# Patient Record
Sex: Female | Born: 1940 | Race: White | Hispanic: No | Smoking: Never smoker
Health system: Southern US, Community
[De-identification: ages and names within clinical notes are randomized; demographics above are authoritative.]

## PROBLEM LIST (undated history)

## (undated) DIAGNOSIS — R053 Chronic cough: Secondary | ICD-10-CM

## (undated) DIAGNOSIS — J45909 Unspecified asthma, uncomplicated: Secondary | ICD-10-CM

## (undated) DIAGNOSIS — R519 Headache, unspecified: Secondary | ICD-10-CM

## (undated) DIAGNOSIS — N189 Chronic kidney disease, unspecified: Secondary | ICD-10-CM

## (undated) DIAGNOSIS — I471 Supraventricular tachycardia, unspecified: Secondary | ICD-10-CM

## (undated) DIAGNOSIS — Z9289 Personal history of other medical treatment: Secondary | ICD-10-CM

## (undated) DIAGNOSIS — I959 Hypotension, unspecified: Secondary | ICD-10-CM

## (undated) DIAGNOSIS — M1990 Unspecified osteoarthritis, unspecified site: Secondary | ICD-10-CM

## (undated) DIAGNOSIS — T4145XA Adverse effect of unspecified anesthetic, initial encounter: Secondary | ICD-10-CM

## (undated) DIAGNOSIS — M109 Gout, unspecified: Secondary | ICD-10-CM

## (undated) DIAGNOSIS — E669 Obesity, unspecified: Secondary | ICD-10-CM

## (undated) DIAGNOSIS — D649 Anemia, unspecified: Secondary | ICD-10-CM

## (undated) DIAGNOSIS — A419 Sepsis, unspecified organism: Secondary | ICD-10-CM

## (undated) DIAGNOSIS — K5792 Diverticulitis of intestine, part unspecified, without perforation or abscess without bleeding: Secondary | ICD-10-CM

## (undated) DIAGNOSIS — I517 Cardiomegaly: Secondary | ICD-10-CM

## (undated) DIAGNOSIS — Y95 Nosocomial condition: Secondary | ICD-10-CM

## (undated) DIAGNOSIS — K5903 Drug induced constipation: Secondary | ICD-10-CM

## (undated) DIAGNOSIS — I509 Heart failure, unspecified: Secondary | ICD-10-CM

## (undated) DIAGNOSIS — IMO0001 Reserved for inherently not codable concepts without codable children: Secondary | ICD-10-CM

## (undated) DIAGNOSIS — J189 Pneumonia, unspecified organism: Secondary | ICD-10-CM

## (undated) DIAGNOSIS — T8859XA Other complications of anesthesia, initial encounter: Secondary | ICD-10-CM

## (undated) DIAGNOSIS — I214 Non-ST elevation (NSTEMI) myocardial infarction: Secondary | ICD-10-CM

## (undated) DIAGNOSIS — R51 Headache: Secondary | ICD-10-CM

## (undated) DIAGNOSIS — R05 Cough: Secondary | ICD-10-CM

## (undated) DIAGNOSIS — I10 Essential (primary) hypertension: Secondary | ICD-10-CM

## (undated) DIAGNOSIS — S143XXA Injury of brachial plexus, initial encounter: Secondary | ICD-10-CM

## (undated) DIAGNOSIS — R002 Palpitations: Secondary | ICD-10-CM

## (undated) DIAGNOSIS — I499 Cardiac arrhythmia, unspecified: Secondary | ICD-10-CM

## (undated) DIAGNOSIS — G4730 Sleep apnea, unspecified: Secondary | ICD-10-CM

## (undated) DIAGNOSIS — E1340 Other specified diabetes mellitus with diabetic neuropathy, unspecified: Secondary | ICD-10-CM

## (undated) DIAGNOSIS — R062 Wheezing: Secondary | ICD-10-CM

## (undated) DIAGNOSIS — J40 Bronchitis, not specified as acute or chronic: Secondary | ICD-10-CM

## (undated) DIAGNOSIS — E039 Hypothyroidism, unspecified: Secondary | ICD-10-CM

## (undated) DIAGNOSIS — E119 Type 2 diabetes mellitus without complications: Secondary | ICD-10-CM

## (undated) DIAGNOSIS — K759 Inflammatory liver disease, unspecified: Secondary | ICD-10-CM

## (undated) HISTORY — PX: EYE SURGERY: SHX253

## (undated) HISTORY — DX: Obesity, unspecified: E66.9

## (undated) HISTORY — DX: Cardiomegaly: I51.7

## (undated) HISTORY — PX: CARDIAC CATHETERIZATION: SHX172

## (undated) HISTORY — PX: ACHILLES TENDON REPAIR: SUR1153

## (undated) HISTORY — PX: ABDOMINAL HYSTERECTOMY: SHX81

---

## 1982-08-28 HISTORY — PX: ABDOMINAL HYSTERECTOMY: SHX81

## 1984-08-28 HISTORY — PX: CHOLECYSTECTOMY: SHX55

## 2003-06-25 ENCOUNTER — Ambulatory Visit (HOSPITAL_COMMUNITY): Admission: RE | Admit: 2003-06-25 | Discharge: 2003-06-25 | Payer: Self-pay | Admitting: Cardiovascular Disease

## 2008-11-29 ENCOUNTER — Emergency Department: Payer: Self-pay | Admitting: Emergency Medicine

## 2009-02-03 ENCOUNTER — Ambulatory Visit: Payer: Self-pay | Admitting: Orthopedic Surgery

## 2009-03-20 ENCOUNTER — Ambulatory Visit: Payer: Self-pay | Admitting: Internal Medicine

## 2009-03-21 ENCOUNTER — Inpatient Hospital Stay: Payer: Self-pay | Admitting: Endocrinology

## 2010-11-15 ENCOUNTER — Ambulatory Visit: Payer: Self-pay | Admitting: Endocrinology

## 2011-11-13 ENCOUNTER — Ambulatory Visit: Payer: Self-pay | Admitting: Endocrinology

## 2013-08-01 ENCOUNTER — Emergency Department: Payer: Self-pay | Admitting: Emergency Medicine

## 2013-08-01 LAB — URINALYSIS, COMPLETE
Bacteria: NONE SEEN
Bilirubin,UR: NEGATIVE
Blood: NEGATIVE
Glucose,UR: NEGATIVE mg/dL (ref 0–75)
Ketone: NEGATIVE
Leukocyte Esterase: NEGATIVE
Nitrite: NEGATIVE
Ph: 5 (ref 4.5–8.0)
Protein: NEGATIVE
RBC,UR: <1 /HPF (ref 0–5)
Specific Gravity: 1.015 (ref 1.003–1.030)
Squamous Epithelial: 1
WBC UR: <1 /HPF (ref 0–5)

## 2013-08-01 LAB — TROPONIN I: Troponin-I: <0.02 ng/mL

## 2013-08-01 LAB — CBC
HCT: 36.8 % (ref 35.0–47.0)
HGB: 12.1 g/dL (ref 12.0–16.0)
MCH: 29.8 pg (ref 26.0–34.0)
MCHC: 32.9 g/dL (ref 32.0–36.0)
MCV: 91 fL (ref 80–100)
Platelet: 321 10*3/uL (ref 150–440)
RBC: 4.06 10*6/uL (ref 3.80–5.20)
RDW: 15.3 % — ABNORMAL HIGH (ref 11.5–14.5)
WBC: 11 10*3/uL (ref 3.6–11.0)

## 2013-08-01 LAB — COMPREHENSIVE METABOLIC PANEL
Albumin: 3.3 g/dL — ABNORMAL LOW (ref 3.4–5.0)
Alkaline Phosphatase: 68 U/L
Anion Gap: 11 (ref 7–16)
BUN: 25 mg/dL — ABNORMAL HIGH (ref 7–18)
Bilirubin,Total: 0.3 mg/dL (ref 0.2–1.0)
Calcium, Total: 9.7 mg/dL (ref 8.5–10.1)
Chloride: 99 mmol/L (ref 98–107)
Co2: 26 mmol/L (ref 21–32)
Creatinine: 1.43 mg/dL — ABNORMAL HIGH (ref 0.60–1.30)
EGFR (African American): 42 — ABNORMAL LOW
EGFR (Non-African Amer.): 36 — ABNORMAL LOW
Glucose: 136 mg/dL — ABNORMAL HIGH (ref 65–99)
Osmolality: 278 (ref 275–301)
Potassium: 3.7 mmol/L (ref 3.5–5.1)
SGOT(AST): 17 U/L (ref 15–37)
SGPT (ALT): 22 U/L (ref 12–78)
Sodium: 136 mmol/L (ref 136–145)
Total Protein: 8.2 g/dL (ref 6.4–8.2)

## 2013-08-01 LAB — CK TOTAL AND CKMB (NOT AT ARMC)
CK, Total: 50 U/L (ref 21–215)
CK-MB: 0.7 ng/mL (ref 0.5–3.6)

## 2013-08-01 LAB — SEDIMENTATION RATE: Erythrocyte Sed Rate: 63 mm/hr — ABNORMAL HIGH (ref 0–30)

## 2014-05-09 ENCOUNTER — Emergency Department: Payer: Self-pay | Admitting: Emergency Medicine

## 2014-05-09 LAB — CBC
HCT: 35.5 % (ref 35.0–47.0)
HGB: 11.7 g/dL — ABNORMAL LOW (ref 12.0–16.0)
MCH: 30.8 pg (ref 26.0–34.0)
MCHC: 33 g/dL (ref 32.0–36.0)
MCV: 93 fL (ref 80–100)
Platelet: 335 10*3/uL (ref 150–440)
RBC: 3.81 10*6/uL (ref 3.80–5.20)
RDW: 15.7 % — ABNORMAL HIGH (ref 11.5–14.5)
WBC: 9.1 10*3/uL (ref 3.6–11.0)

## 2014-05-09 LAB — TROPONIN I: Troponin-I: <0.02 ng/mL

## 2014-05-09 LAB — BASIC METABOLIC PANEL
Anion Gap: 9 (ref 7–16)
BUN: 19 mg/dL — ABNORMAL HIGH (ref 7–18)
Calcium, Total: 9.1 mg/dL (ref 8.5–10.1)
Chloride: 108 mmol/L — ABNORMAL HIGH (ref 98–107)
Co2: 24 mmol/L (ref 21–32)
Creatinine: 1.22 mg/dL (ref 0.60–1.30)
EGFR (African American): 51 — ABNORMAL LOW
EGFR (Non-African Amer.): 44 — ABNORMAL LOW
Glucose: 105 mg/dL — ABNORMAL HIGH (ref 65–99)
Osmolality: 284 (ref 275–301)
Potassium: 3.4 mmol/L — ABNORMAL LOW (ref 3.5–5.1)
Sodium: 141 mmol/L (ref 136–145)

## 2014-05-09 LAB — MAGNESIUM: Magnesium: 1.7 mg/dL — ABNORMAL LOW

## 2014-05-20 ENCOUNTER — Ambulatory Visit: Payer: Self-pay | Admitting: Endocrinology

## 2015-02-09 NOTE — Discharge Instructions (Signed)

## 2015-02-10 ENCOUNTER — Ambulatory Visit
Admission: RE | Admit: 2015-02-10 | Discharge: 2015-02-10 | Disposition: A | Discharge status: Home / Self Care | Payer: Medicare Other | Source: Ambulatory Visit | Attending: Ophthalmology | Admitting: Ophthalmology

## 2015-02-10 ENCOUNTER — Ambulatory Visit: Admission: RE | Admit: 2015-02-10 | Payer: Self-pay | Source: Ambulatory Visit | Admitting: Ophthalmology

## 2015-02-10 ENCOUNTER — Encounter
Admission: RE | Disposition: A | Discharge status: Home / Self Care | Payer: Self-pay | Source: Ambulatory Visit | Attending: Ophthalmology

## 2015-02-10 ENCOUNTER — Ambulatory Visit: Payer: Medicare Other | Admitting: Anesthesiology

## 2015-02-10 DIAGNOSIS — E119 Type 2 diabetes mellitus without complications: Secondary | ICD-10-CM | POA: Insufficient documentation

## 2015-02-10 DIAGNOSIS — R062 Wheezing: Secondary | ICD-10-CM | POA: Insufficient documentation

## 2015-02-10 DIAGNOSIS — Z87898 Personal history of other specified conditions: Secondary | ICD-10-CM | POA: Diagnosis not present

## 2015-02-10 DIAGNOSIS — I509 Heart failure, unspecified: Secondary | ICD-10-CM | POA: Insufficient documentation

## 2015-02-10 DIAGNOSIS — Z90710 Acquired absence of both cervix and uterus: Secondary | ICD-10-CM | POA: Diagnosis not present

## 2015-02-10 DIAGNOSIS — Z9049 Acquired absence of other specified parts of digestive tract: Secondary | ICD-10-CM | POA: Insufficient documentation

## 2015-02-10 DIAGNOSIS — J449 Chronic obstructive pulmonary disease, unspecified: Secondary | ICD-10-CM | POA: Diagnosis not present

## 2015-02-10 DIAGNOSIS — M109 Gout, unspecified: Secondary | ICD-10-CM | POA: Insufficient documentation

## 2015-02-10 DIAGNOSIS — H2512 Age-related nuclear cataract, left eye: Secondary | ICD-10-CM | POA: Diagnosis not present

## 2015-02-10 DIAGNOSIS — R002 Palpitations: Secondary | ICD-10-CM | POA: Insufficient documentation

## 2015-02-10 DIAGNOSIS — I471 Supraventricular tachycardia: Secondary | ICD-10-CM | POA: Diagnosis not present

## 2015-02-10 DIAGNOSIS — Z885 Allergy status to narcotic agent status: Secondary | ICD-10-CM | POA: Insufficient documentation

## 2015-02-10 DIAGNOSIS — I10 Essential (primary) hypertension: Secondary | ICD-10-CM | POA: Insufficient documentation

## 2015-02-10 DIAGNOSIS — K5790 Diverticulosis of intestine, part unspecified, without perforation or abscess without bleeding: Secondary | ICD-10-CM | POA: Insufficient documentation

## 2015-02-10 DIAGNOSIS — Z79899 Other long term (current) drug therapy: Secondary | ICD-10-CM | POA: Insufficient documentation

## 2015-02-10 DIAGNOSIS — M7989 Other specified soft tissue disorders: Secondary | ICD-10-CM | POA: Insufficient documentation

## 2015-02-10 HISTORY — DX: Heart failure, unspecified: I50.9

## 2015-02-10 HISTORY — DX: Palpitations: R00.2

## 2015-02-10 HISTORY — DX: Type 2 diabetes mellitus without complications: E11.9

## 2015-02-10 HISTORY — DX: Reserved for inherently not codable concepts without codable children: IMO0001

## 2015-02-10 HISTORY — DX: Other specified diabetes mellitus with diabetic neuropathy, unspecified: E13.40

## 2015-02-10 HISTORY — DX: Other complications of anesthesia, initial encounter: T88.59XA

## 2015-02-10 HISTORY — DX: Supraventricular tachycardia: I47.1

## 2015-02-10 HISTORY — DX: Pneumonia, unspecified organism: J18.9

## 2015-02-10 HISTORY — DX: Anemia, unspecified: D64.9

## 2015-02-10 HISTORY — DX: Wheezing: R06.2

## 2015-02-10 HISTORY — DX: Supraventricular tachycardia, unspecified: I47.10

## 2015-02-10 HISTORY — DX: Unspecified asthma, uncomplicated: J45.909

## 2015-02-10 HISTORY — DX: Gout, unspecified: M10.9

## 2015-02-10 HISTORY — DX: Adverse effect of unspecified anesthetic, initial encounter: T41.45XA

## 2015-02-10 HISTORY — DX: Essential (primary) hypertension: I10

## 2015-02-10 HISTORY — DX: Inflammatory liver disease, unspecified: K75.9

## 2015-02-10 HISTORY — DX: Chronic cough: R05.3

## 2015-02-10 HISTORY — DX: Unspecified osteoarthritis, unspecified site: M19.90

## 2015-02-10 HISTORY — DX: Diverticulitis of intestine, part unspecified, without perforation or abscess without bleeding: K57.92

## 2015-02-10 HISTORY — DX: Hypothyroidism, unspecified: E03.9

## 2015-02-10 HISTORY — PX: CATARACT EXTRACTION W/PHACO: SHX586

## 2015-02-10 HISTORY — DX: Cough: R05

## 2015-02-10 HISTORY — DX: Bronchitis, not specified as acute or chronic: J40

## 2015-02-10 HISTORY — DX: Injury of brachial plexus, initial encounter: S14.3XXA

## 2015-02-10 LAB — GLUCOSE, CAPILLARY
Glucose-Capillary: 110 mg/dL — ABNORMAL HIGH (ref 65–99)
Glucose-Capillary: 119 mg/dL — ABNORMAL HIGH (ref 65–99)

## 2015-02-10 SURGERY — PHACOEMULSIFICATION, CATARACT, WITH IOL INSERTION
Anesthesia: General | Laterality: Left

## 2015-02-10 MED ORDER — BRIMONIDINE TARTRATE 0.2 % OP SOLN
OPHTHALMIC | Status: DC | PRN
  Administered 2015-02-10: 1 [drp] via OPHTHALMIC

## 2015-02-10 MED ORDER — POVIDONE-IODINE 5 % OP SOLN
1.0000 "application " | OPHTHALMIC | Status: DC | PRN
  Administered 2015-02-10: 1 via OPHTHALMIC

## 2015-02-10 MED ORDER — MIDAZOLAM HCL 2 MG/2ML IJ SOLN
INTRAMUSCULAR | Status: DC | PRN
  Administered 2015-02-10: 2 mg via INTRAVENOUS

## 2015-02-10 MED ORDER — EPINEPHRINE HCL 1 MG/ML IJ SOLN
INTRAOCULAR | Status: DC | PRN
  Administered 2015-02-10: 75 mL via OPHTHALMIC

## 2015-02-10 MED ORDER — LIDOCAINE HCL (PF) 4 % IJ SOLN
INTRAMUSCULAR | Status: DC | PRN
  Administered 2015-02-10: .5 mL via OPHTHALMIC

## 2015-02-10 MED ORDER — CEFUROXIME OPHTHALMIC INJECTION 1 MG/0.1 ML
INJECTION | OPHTHALMIC | Status: DC | PRN
  Administered 2015-02-10: 1 mg via INTRACAMERAL

## 2015-02-10 MED ORDER — TIMOLOL MALEATE 0.5 % OP SOLN
OPHTHALMIC | Status: DC | PRN
  Administered 2015-02-10: 1 [drp] via OPHTHALMIC

## 2015-02-10 MED ORDER — TETRACAINE HCL 0.5 % OP SOLN
1.0000 [drp] | OPHTHALMIC | Status: DC | PRN
  Administered 2015-02-10: 1 [drp] via OPHTHALMIC

## 2015-02-10 MED ORDER — ARMC OPHTHALMIC DILATING GEL
1.0000 "application " | OPHTHALMIC | Status: DC | PRN
  Administered 2015-02-10 (×2): 1 via OPHTHALMIC

## 2015-02-10 MED ORDER — NA HYALUR & NA CHOND-NA HYALUR 0.4-0.35 ML IO KIT
PACK | INTRAOCULAR | Status: DC | PRN
  Administered 2015-02-10: 1 mL via INTRAOCULAR

## 2015-02-10 MED ORDER — FENTANYL CITRATE (PF) 100 MCG/2ML IJ SOLN
INTRAMUSCULAR | Status: DC | PRN
Start: 2015-02-10 — End: 2015-02-10
  Administered 2015-02-10: 50 ug via INTRAVENOUS

## 2015-02-10 SURGICAL SUPPLY — 28 items
CANNULA ANT/CHMB 27G (MISCELLANEOUS) ×1 IMPLANT
CANNULA ANT/CHMB 27GA (MISCELLANEOUS) ×3 IMPLANT
GLOVE SURG LX 7.5 STRW (GLOVE) ×2
GLOVE SURG LX STRL 7.5 STRW (GLOVE) ×1 IMPLANT
GLOVE SURG TRIUMPH 8.0 PF LTX (GLOVE) ×3 IMPLANT
GOWN STRL REUS W/ TWL LRG LVL3 (GOWN DISPOSABLE) ×2 IMPLANT
GOWN STRL REUS W/TWL LRG LVL3 (GOWN DISPOSABLE) ×6
LENS IOL ACRSF IQ PC 19.0 (Intraocular Lens) IMPLANT
LENS IOL ACRYSOF IQ POST 19.0 (Intraocular Lens) ×3 IMPLANT
MARKER SKIN SURG W/RULER VIO (MISCELLANEOUS) ×3 IMPLANT
NDL FILTER BLUNT 18X1 1/2 (NEEDLE) ×1 IMPLANT
NDL RETROBULBAR .5 NSTRL (NEEDLE) IMPLANT
NEEDLE FILTER BLUNT 18X 1/2SAF (NEEDLE) ×2
NEEDLE FILTER BLUNT 18X1 1/2 (NEEDLE) ×1 IMPLANT
PACK CATARACT BRASINGTON (MISCELLANEOUS) ×3 IMPLANT
PACK EYE AFTER SURG (MISCELLANEOUS) ×3 IMPLANT
PACK OPTHALMIC (MISCELLANEOUS) ×3 IMPLANT
RING MALYGIN 7.0 (MISCELLANEOUS) IMPLANT
SUT ETHILON 10-0 CS-B-6CS-B-6 (SUTURE)
SUT VICRYL  9 0 (SUTURE)
SUT VICRYL 9 0 (SUTURE) IMPLANT
SUTURE EHLN 10-0 CS-B-6CS-B-6 (SUTURE) IMPLANT
SYR 3ML LL SCALE MARK (SYRINGE) ×3 IMPLANT
SYR 5ML LL (SYRINGE) IMPLANT
SYR TB 1ML LUER SLIP (SYRINGE) ×3 IMPLANT
WATER STERILE IRR 250ML POUR (IV SOLUTION) ×3 IMPLANT
WATER STERILE IRR 500ML POUR (IV SOLUTION) IMPLANT
WIPE NON LINTING 3.25X3.25 (MISCELLANEOUS) ×3 IMPLANT

## 2015-02-10 NOTE — Anesthesia Postprocedure Evaluation (Signed)
  Anesthesia Post-op Note  Patient: Tamara [name]  Procedure(s) Performed: Procedure(s) with comments: CATARACT EXTRACTION PHACO AND INTRAOCULAR LENS PLACEMENT (IOC) (Left) - DIABETIC  Anesthesia type:General  Patient location: PACU  Post pain: Pain level controlled  Post assessment: Post-op Vital signs reviewed, Patient's Cardiovascular Status Stable, Respiratory Function Stable, Patent Airway and No signs of Nausea or vomiting  Post vital signs: Reviewed and stable  Last Vitals:  Filed Vitals:   02/10/15 0757  BP:   Pulse: 60  Temp: 35.2 C  Resp: 8    Level of consciousness: awake, alert  and patient cooperative  Complications: No apparent anesthesia complications

## 2015-02-10 NOTE — Anesthesia Preprocedure Evaluation (Signed)
Anesthesia Evaluation  Patient identified by MRN, date of birth, ID band Patient awake    Reviewed: Allergy & Precautions, H&P , NPO status , Patient's Chart, lab work & pertinent test results, reviewed documented beta blocker date and time   Airway Mallampati: II  TM Distance: >3 FB Neck ROM: full    Dental no notable dental hx.    Pulmonary neg pulmonary ROS, shortness of breath and with exertion, asthma ,  breath sounds clear to auscultation  Pulmonary exam normal       Cardiovascular Exercise Tolerance: Good hypertension, +CHF Supra Ventricular Tachycardia Rhythm:regular Rate:Normal  Pt can lie flat without chest pain or shortness of breath.   Neuro/Psych negative neurological ROS  negative psych ROS   GI/Hepatic negative GI ROS, Neg liver ROS, (+) Hepatitis -  Endo/Other  negative endocrine ROSdiabetesHypothyroidism   Renal/GU negative Renal ROS  negative genitourinary   Musculoskeletal   Abdominal   Peds  Hematology negative hematology ROS (+)   Anesthesia Other Findings   Reproductive/Obstetrics negative OB ROS                             Anesthesia Physical Anesthesia Plan  ASA: II  Anesthesia Plan: General   Post-op Pain Management:    Induction:   Airway Management Planned:   Additional Equipment:   Intra-op Plan:   Post-operative Plan:   Informed Consent: I have reviewed the patients History and Physical, chart, labs and discussed the procedure including the risks, benefits and alternatives for the proposed anesthesia with the patient or authorized representative who has indicated his/her understanding and acceptance.   Dental Advisory Given  Plan Discussed with: CRNA  Anesthesia Plan Comments:         Anesthesia Quick Evaluation

## 2015-02-10 NOTE — Anesthesia Procedure Notes (Signed)
Procedure Name: MAC Performed by: Xavius Spadafore Pre-anesthesia Checklist: Patient identified, Emergency Drugs available, Suction available, Timeout performed and Patient being monitored Patient Re-evaluated:Patient Re-evaluated prior to inductionOxygen Delivery Method: Nasal cannula Placement Confirmation: positive ETCO2     

## 2015-02-10 NOTE — H&P (Signed)
  The History and Physical notes were scanned in.  The patient remains stable and unchanged from the H&P.   Previous H&P reviewed, patient examined, and there are no changes.  Tamara [name] 02/10/2015 7:33 AM

## 2015-02-10 NOTE — Transfer of Care (Signed)
Immediate Anesthesia Transfer of Care Note  Patient: Tamara [name]  Procedure(s) Performed: Procedure(s) with comments: CATARACT EXTRACTION PHACO AND INTRAOCULAR LENS PLACEMENT (IOC) (Left) - DIABETIC  Patient Location: PACU  Anesthesia Type: General  Level of Consciousness: awake, alert  and patient cooperative  Airway and Oxygen Therapy: Patient Spontanous Breathing and Patient connected to supplemental oxygen  Post-op Assessment: Post-op Vital signs reviewed, Patient's Cardiovascular Status Stable, Respiratory Function Stable, Patent Airway and No signs of Nausea or vomiting  Post-op Vital Signs: Reviewed and stable  Complications: No apparent anesthesia complications

## 2015-02-10 NOTE — Op Note (Signed)
OPERATIVE NOTE  Tamara [name] [id number] 02/10/2015   PREOPERATIVE DIAGNOSIS:  Nuclear sclerotic cataract left eye. H25.12   POSTOPERATIVE DIAGNOSIS:    Nuclear sclerotic cataract left eye.     PROCEDURE:  Phacoemusification with posterior chamber intraocular lens placement of the left eye   LENS:   Implant Name Type Inv. Item Serial No. Manufacturer Lot No. LRB No. Used  IMPLANT LENS - [id number] Intraocular Lens IMPLANT LENS [id number] ALCON   Left 1     [id number] 11.0   ULTRASOUND TIME: 12%  of 1 minutes 9 seconds, CDE 8.7   SURGEON:  Deirdre Evener, MD   ANESTHESIA:  Topical with tetracaine drops and 2% Xylocaine jelly.   COMPLICATIONS:  None.   DESCRIPTION OF PROCEDURE:  The patient was identified in the holding room and transported to the operating room and placed in the supine position under the operating microscope.  The left eye was identified as the operative eye and it was prepped and draped in the usual sterile ophthalmic fashion.   A 1 millimeter clear-corneal paracentesis was made at the 1:30 position.  The anterior chamber was filled with Viscoat viscoelastic.  A 2.4 millimeter keratome was used to make a near-clear corneal incision at the 10:30 position.  .  A curvilinear capsulorrhexis was made with a cystotome and capsulorrhexis forceps.  Balanced salt solution was used to hydrodissect and hydrodelineate the nucleus.   Phacoemulsification was then used in stop and chop fashion to remove the lens nucleus and epinucleus.  The remaining cortex was then removed using the irrigation and aspiration handpiece. Provisc was then placed into the capsular bag to distend it for lens placement.  A lens was then injected into the capsular bag.  The remaining viscoelastic was aspirated.   Wounds were hydrated with balanced salt solution.  The anterior chamber was inflated to a physiologic pressure with balanced salt solution.  No wound leaks were noted. Cefuroxime 0.1 ml of a  10mg /ml solution was injected into the anterior chamber for a dose of 1 mg of intracameral antibiotic at the completion of the case.   Timolol and Brimonidine drops were applied to the eye.  The patient was taken to the recovery room in stable condition without complications of anesthesia or surgery.  [name] 02/10/2015, 7:53 AM

## 2015-02-11 ENCOUNTER — Encounter: Payer: Self-pay | Admitting: Ophthalmology

## 2015-02-17 ENCOUNTER — Encounter: Payer: Self-pay | Admitting: *Deleted

## 2015-02-23 NOTE — Discharge Instructions (Signed)

## 2015-02-24 ENCOUNTER — Encounter
Admission: RE | Disposition: A | Discharge status: Home / Self Care | Payer: Self-pay | Source: Ambulatory Visit | Attending: Ophthalmology

## 2015-02-24 ENCOUNTER — Ambulatory Visit
Admission: RE | Admit: 2015-02-24 | Discharge: 2015-02-24 | Disposition: A | Discharge status: Home / Self Care | Payer: Medicare Other | Source: Ambulatory Visit | Attending: Ophthalmology | Admitting: Ophthalmology

## 2015-02-24 ENCOUNTER — Ambulatory Visit: Payer: Medicare Other | Admitting: Anesthesiology

## 2015-02-24 ENCOUNTER — Encounter: Payer: Self-pay | Admitting: *Deleted

## 2015-02-24 DIAGNOSIS — J449 Chronic obstructive pulmonary disease, unspecified: Secondary | ICD-10-CM | POA: Insufficient documentation

## 2015-02-24 DIAGNOSIS — R062 Wheezing: Secondary | ICD-10-CM | POA: Insufficient documentation

## 2015-02-24 DIAGNOSIS — K5790 Diverticulosis of intestine, part unspecified, without perforation or abscess without bleeding: Secondary | ICD-10-CM | POA: Insufficient documentation

## 2015-02-24 DIAGNOSIS — M7989 Other specified soft tissue disorders: Secondary | ICD-10-CM | POA: Diagnosis not present

## 2015-02-24 DIAGNOSIS — Z79899 Other long term (current) drug therapy: Secondary | ICD-10-CM | POA: Diagnosis not present

## 2015-02-24 DIAGNOSIS — Z885 Allergy status to narcotic agent status: Secondary | ICD-10-CM | POA: Insufficient documentation

## 2015-02-24 DIAGNOSIS — R002 Palpitations: Secondary | ICD-10-CM | POA: Insufficient documentation

## 2015-02-24 DIAGNOSIS — E119 Type 2 diabetes mellitus without complications: Secondary | ICD-10-CM | POA: Insufficient documentation

## 2015-02-24 DIAGNOSIS — H2511 Age-related nuclear cataract, right eye: Secondary | ICD-10-CM | POA: Insufficient documentation

## 2015-02-24 DIAGNOSIS — M1990 Unspecified osteoarthritis, unspecified site: Secondary | ICD-10-CM | POA: Insufficient documentation

## 2015-02-24 DIAGNOSIS — Z90710 Acquired absence of both cervix and uterus: Secondary | ICD-10-CM | POA: Insufficient documentation

## 2015-02-24 DIAGNOSIS — Z9049 Acquired absence of other specified parts of digestive tract: Secondary | ICD-10-CM | POA: Diagnosis not present

## 2015-02-24 DIAGNOSIS — I471 Supraventricular tachycardia: Secondary | ICD-10-CM | POA: Insufficient documentation

## 2015-02-24 DIAGNOSIS — I509 Heart failure, unspecified: Secondary | ICD-10-CM | POA: Diagnosis not present

## 2015-02-24 DIAGNOSIS — M109 Gout, unspecified: Secondary | ICD-10-CM | POA: Insufficient documentation

## 2015-02-24 DIAGNOSIS — I10 Essential (primary) hypertension: Secondary | ICD-10-CM | POA: Diagnosis not present

## 2015-02-24 HISTORY — PX: CATARACT EXTRACTION W/PHACO: SHX586

## 2015-02-24 LAB — GLUCOSE, CAPILLARY
GLUCOSE-CAPILLARY: 117 mg/dL — AB (ref 65–99)
GLUCOSE-CAPILLARY: 106 mg/dL — AB (ref 65–99)

## 2015-02-24 SURGERY — PHACOEMULSIFICATION, CATARACT, WITH IOL INSERTION
Anesthesia: Monitor Anesthesia Care | Laterality: Right | Wound class: Clean

## 2015-02-24 MED ORDER — TIMOLOL MALEATE 0.5 % OP SOLN
OPHTHALMIC | Status: DC | PRN
  Administered 2015-02-24: 1 [drp] via OPHTHALMIC

## 2015-02-24 MED ORDER — CEFUROXIME OPHTHALMIC INJECTION 1 MG/0.1 ML
INJECTION | OPHTHALMIC | Status: DC | PRN
  Administered 2015-02-24: .3 mL via INTRACAMERAL

## 2015-02-24 MED ORDER — BRIMONIDINE TARTRATE 0.2 % OP SOLN
OPHTHALMIC | Status: DC | PRN
  Administered 2015-02-24: 1 [drp] via OPHTHALMIC

## 2015-02-24 MED ORDER — FENTANYL CITRATE (PF) 100 MCG/2ML IJ SOLN
INTRAMUSCULAR | Status: DC | PRN
  Administered 2015-02-24: 50 ug via INTRAVENOUS

## 2015-02-24 MED ORDER — MIDAZOLAM HCL 2 MG/2ML IJ SOLN
INTRAMUSCULAR | Status: DC | PRN
  Administered 2015-02-24: 1 mg via INTRAVENOUS

## 2015-02-24 MED ORDER — LIDOCAINE HCL (PF) 4 % IJ SOLN
INTRAMUSCULAR | Status: DC | PRN
  Administered 2015-02-24: .2 mL via OPHTHALMIC

## 2015-02-24 MED ORDER — NA HYALUR & NA CHOND-NA HYALUR 0.4-0.35 ML IO KIT
PACK | INTRAOCULAR | Status: DC | PRN
  Administered 2015-02-24: 1 mL via INTRAOCULAR

## 2015-02-24 MED ORDER — TETRACAINE HCL 0.5 % OP SOLN
1.0000 [drp] | Freq: Once | OPHTHALMIC | Status: AC
  Administered 2015-02-24: 1 [drp] via OPHTHALMIC

## 2015-02-24 MED ORDER — BSS IO SOLN
INTRAOCULAR | Status: DC | PRN
  Administered 2015-02-24: 70 mL via OPHTHALMIC

## 2015-02-24 MED ORDER — ARMC OPHTHALMIC DILATING GEL
1.0000 "application " | OPHTHALMIC | Status: DC | PRN
  Administered 2015-02-24 (×2): 1 via OPHTHALMIC

## 2015-02-24 MED ORDER — POVIDONE-IODINE 5 % OP SOLN
1.0000 "application " | Freq: Once | OPHTHALMIC | Status: AC
  Administered 2015-02-24: 1 via OPHTHALMIC

## 2015-02-24 SURGICAL SUPPLY — 27 items
CANNULA ANT/CHMB 27G (MISCELLANEOUS) ×1 IMPLANT
CANNULA ANT/CHMB 27GA (MISCELLANEOUS) ×2 IMPLANT
GLOVE SURG LX 7.5 STRW (GLOVE) ×1
GLOVE SURG LX STRL 7.5 STRW (GLOVE) ×1 IMPLANT
GLOVE SURG TRIUMPH 8.0 PF LTX (GLOVE) ×2 IMPLANT
GOWN STRL REUS W/ TWL LRG LVL3 (GOWN DISPOSABLE) ×2 IMPLANT
GOWN STRL REUS W/TWL LRG LVL3 (GOWN DISPOSABLE) ×4
MARKER SKIN SURG W/RULER VIO (MISCELLANEOUS) ×2 IMPLANT
NDL FILTER BLUNT 18X1 1/2 (NEEDLE) ×1 IMPLANT
NDL RETROBULBAR .5 NSTRL (NEEDLE) IMPLANT
NEEDLE FILTER BLUNT 18X 1/2SAF (NEEDLE) ×1
NEEDLE FILTER BLUNT 18X1 1/2 (NEEDLE) ×1 IMPLANT
PACK CATARACT BRASINGTON (MISCELLANEOUS) ×2 IMPLANT
PACK EYE AFTER SURG (MISCELLANEOUS) ×2 IMPLANT
PACK OPTHALMIC (MISCELLANEOUS) ×2 IMPLANT
RING MALYGIN 7.0 (MISCELLANEOUS) IMPLANT
SN60WF intraocular lens (Intraocular Lens) ×1 IMPLANT
SUT ETHILON 10-0 CS-B-6CS-B-6 (SUTURE)
SUT VICRYL  9 0 (SUTURE)
SUT VICRYL 9 0 (SUTURE) IMPLANT
SUTURE EHLN 10-0 CS-B-6CS-B-6 (SUTURE) IMPLANT
SYR 3ML LL SCALE MARK (SYRINGE) ×2 IMPLANT
SYR 5ML LL (SYRINGE) IMPLANT
SYR TB 1ML LUER SLIP (SYRINGE) ×2 IMPLANT
WATER STERILE IRR 250ML POUR (IV SOLUTION) ×2 IMPLANT
WATER STERILE IRR 500ML POUR (IV SOLUTION) IMPLANT
WIPE NON LINTING 3.25X3.25 (MISCELLANEOUS) ×2 IMPLANT

## 2015-02-24 NOTE — Anesthesia Postprocedure Evaluation (Signed)
  Anesthesia Post-op Note  Patient: Tamara [name]  Procedure(s) Performed: Procedure(s): CATARACT EXTRACTION PHACO AND INTRAOCULAR LENS PLACEMENT (IOC) (Right)  Anesthesia type:MAC  Patient location: PACU  Post pain: Pain level controlled  Post assessment: Post-op Vital signs reviewed, Patient's Cardiovascular Status Stable, Respiratory Function Stable, Patent Airway and No signs of Nausea or vomiting  Post vital signs: Reviewed and stable  Last Vitals:  Filed Vitals:   02/24/15 1145  BP: 116/54  Pulse: 69  Temp:   Resp: 16    Level of consciousness: awake, alert  and patient cooperative  Complications: No apparent anesthesia complications

## 2015-02-24 NOTE — H&P (Signed)
  The History and Physical notes were scanned in.  The patient remains stable and unchanged from the H&P.   Previous H&P reviewed, patient examined, and there are no changes.  Tamara [name] 02/24/2015 11:01 AM

## 2015-02-24 NOTE — Transfer of Care (Signed)
Immediate Anesthesia Transfer of Care Note  Patient: Tamara [name]  Procedure(s) Performed: Procedure(s): CATARACT EXTRACTION PHACO AND INTRAOCULAR LENS PLACEMENT (IOC) (Right)  Patient Location: PACU  Anesthesia Type: MAC  Level of Consciousness: awake, alert  and patient cooperative  Airway and Oxygen Therapy: Patient Spontanous Breathing and Patient connected to supplemental oxygen  Post-op Assessment: Post-op Vital signs reviewed, Patient's Cardiovascular Status Stable, Respiratory Function Stable, Patent Airway and No signs of Nausea or vomiting  Post-op Vital Signs: Reviewed and stable  Complications: No apparent anesthesia complications

## 2015-02-24 NOTE — Op Note (Signed)
LOCATION:  [facility]   PREOPERATIVE DIAGNOSIS:    Nuclear sclerotic cataract right eye. H25.11   POSTOPERATIVE DIAGNOSIS:  Nuclear sclerotic cataract right eye.     PROCEDURE:  Phacoemusification with posterior chamber intraocular lens placement of the right eye   LENS:   Implant Name Type Inv. Item Serial No. Manufacturer Lot No. LRB No. Used  [id number] intraocular lens Intraocular Lens   [id number] ALCON   Right 1     9.0 D PCIOL   ULTRASOUND TIME: 14.5 % of 1 minutes, 28 seconds.  CDE 12.8   SURGEON:  Deirdre Evenerhadwick R. Neilson Oehlert, MD   ANESTHESIA:  Topical with tetracaine drops and 2% Xylocaine jelly, augmented with 1% preservative-free intracameral lidocaine.  COMPLICATIONS:  None.   DESCRIPTION OF PROCEDURE:  The patient was identified in the holding room and transported to the operating room and placed in the supine position under the operating microscope.  The right eye was identified as the operative eye and it was prepped and draped in the usual sterile ophthalmic fashion.   A 1 millimeter clear-corneal paracentesis was made at the 12:00 position.  0.5 ml of preservative-free 1% lidocaine was injected into the anterior chamber. The anterior chamber was filled with Viscoat viscoelastic.  A 2.4 millimeter keratome was used to make a near-clear corneal incision at the 9:00 position.  A curvilinear capsulorrhexis was made with a cystotome and capsulorrhexis forceps.  Balanced salt solution was used to hydrodissect and hydrodelineate the nucleus.   Phacoemulsification was then used in stop and chop fashion to remove the lens nucleus and epinucleus.  The remaining cortex was then removed using the irrigation and aspiration handpiece. Provisc was then placed into the capsular bag to distend it for lens placement.  A lens was then injected into the capsular bag.  The remaining viscoelastic was aspirated.   Wounds were hydrated with balanced salt solution.  The anterior chamber was  inflated to a physiologic pressure with balanced salt solution.  No wound leaks were noted. Cefuroxime 0.1 ml of a 10mg /ml solution was injected into the anterior chamber for a dose of 1 mg of intracameral antibiotic at the completion of the case.   Timolol and Brimonidine drops were applied to the eye.  The patient was taken to the recovery room in stable condition without complications of anesthesia or surgery.   Adamariz Gillott 02/24/2015, 11:35 AM

## 2015-02-24 NOTE — Anesthesia Preprocedure Evaluation (Addendum)
Anesthesia Evaluation    Airway Mallampati: II  TM Distance: >3 FB Neck ROM: Full    Dental no notable dental hx.    Pulmonary asthma ,  breath sounds clear to auscultation  Pulmonary exam normal       Cardiovascular hypertension, +CHF Normal cardiovascular exam+ dysrhythmias Supra Ventricular Tachycardia Rhythm:Regular Rate:Normal  Pt can lie flat.  No symptoms of SVT since betablocker therapy was started.     Neuro/Psych    GI/Hepatic   Endo/Other  diabetes  Renal/GU      Musculoskeletal   Abdominal   Peds  Hematology   Anesthesia Other Findings   Reproductive/Obstetrics                            Anesthesia Physical Anesthesia Plan  ASA: III  Anesthesia Plan: MAC   Post-op Pain Management:    Induction: Intravenous  Airway Management Planned:   Additional Equipment:   Intra-op Plan:   Post-operative Plan: Extubation in OR  Informed Consent: I have reviewed the patients History and Physical, chart, labs and discussed the procedure including the risks, benefits and alternatives for the proposed anesthesia with the patient or authorized representative who has indicated his/her understanding and acceptance.   Dental advisory given  Plan Discussed with: CRNA  Anesthesia Plan Comments:        Anesthesia Quick Evaluation

## 2015-02-25 ENCOUNTER — Encounter: Payer: Self-pay | Admitting: Ophthalmology

## 2015-11-20 ENCOUNTER — Emergency Department
Admission: EM | Admit: 2015-11-20 | Discharge: 2015-11-20 | Disposition: A | Discharge status: Home / Self Care | Payer: Medicare Other | Attending: Emergency Medicine | Admitting: Emergency Medicine

## 2015-11-20 ENCOUNTER — Emergency Department: Payer: Medicare Other

## 2015-11-20 ENCOUNTER — Encounter: Payer: Self-pay | Admitting: Emergency Medicine

## 2015-11-20 DIAGNOSIS — Z90710 Acquired absence of both cervix and uterus: Secondary | ICD-10-CM | POA: Insufficient documentation

## 2015-11-20 DIAGNOSIS — E039 Hypothyroidism, unspecified: Secondary | ICD-10-CM | POA: Diagnosis not present

## 2015-11-20 DIAGNOSIS — I110 Hypertensive heart disease with heart failure: Secondary | ICD-10-CM | POA: Diagnosis not present

## 2015-11-20 DIAGNOSIS — Z8701 Personal history of pneumonia (recurrent): Secondary | ICD-10-CM | POA: Diagnosis not present

## 2015-11-20 DIAGNOSIS — Z9049 Acquired absence of other specified parts of digestive tract: Secondary | ICD-10-CM | POA: Insufficient documentation

## 2015-11-20 DIAGNOSIS — M1990 Unspecified osteoarthritis, unspecified site: Secondary | ICD-10-CM | POA: Insufficient documentation

## 2015-11-20 DIAGNOSIS — I509 Heart failure, unspecified: Secondary | ICD-10-CM | POA: Diagnosis not present

## 2015-11-20 DIAGNOSIS — Z79899 Other long term (current) drug therapy: Secondary | ICD-10-CM | POA: Diagnosis not present

## 2015-11-20 DIAGNOSIS — E1140 Type 2 diabetes mellitus with diabetic neuropathy, unspecified: Secondary | ICD-10-CM | POA: Diagnosis not present

## 2015-11-20 DIAGNOSIS — J45909 Unspecified asthma, uncomplicated: Secondary | ICD-10-CM | POA: Diagnosis not present

## 2015-11-20 DIAGNOSIS — Z885 Allergy status to narcotic agent status: Secondary | ICD-10-CM | POA: Diagnosis not present

## 2015-11-20 DIAGNOSIS — R0789 Other chest pain: Secondary | ICD-10-CM | POA: Insufficient documentation

## 2015-11-20 DIAGNOSIS — R079 Chest pain, unspecified: Secondary | ICD-10-CM

## 2015-11-20 DIAGNOSIS — Z7984 Long term (current) use of oral hypoglycemic drugs: Secondary | ICD-10-CM | POA: Diagnosis not present

## 2015-11-20 LAB — COMPREHENSIVE METABOLIC PANEL
ALBUMIN: 3.5 g/dL (ref 3.5–5.0)
ALK PHOS: 47 U/L (ref 38–126)
ALT: 16 U/L (ref 14–54)
ANION GAP: 6 (ref 5–15)
AST: 20 U/L (ref 15–41)
BUN: 21 mg/dL — ABNORMAL HIGH (ref 6–20)
CO2: 25 mmol/L (ref 22–32)
Calcium: 8.9 mg/dL (ref 8.9–10.3)
Chloride: 105 mmol/L (ref 101–111)
Creatinine, Ser: 1 mg/dL (ref 0.44–1.00)
GFR calc Af Amer: >60 mL/min (ref >60)
GFR calc non Af Amer: 54 mL/min — ABNORMAL LOW (ref >60)
GLUCOSE: 110 mg/dL — AB (ref 65–99)
POTASSIUM: 4 mmol/L (ref 3.5–5.1)
SODIUM: 136 mmol/L (ref 135–145)
Total Bilirubin: 0.4 mg/dL (ref 0.3–1.2)
Total Protein: 7.6 g/dL (ref 6.5–8.1)

## 2015-11-20 LAB — CBC
HEMATOCRIT: 36 % (ref 35.0–47.0)
Hemoglobin: 11.8 g/dL — ABNORMAL LOW (ref 12.0–16.0)
MCH: 27.7 pg (ref 26.0–34.0)
MCHC: 32.7 g/dL (ref 32.0–36.0)
MCV: 84.6 fL (ref 80.0–100.0)
Platelets: 314 10*3/uL (ref 150–440)
RBC: 4.26 MIL/uL (ref 3.80–5.20)
RDW: 15.8 % — AB (ref 11.5–14.5)
WBC: 8.7 10*3/uL (ref 3.6–11.0)

## 2015-11-20 LAB — TROPONIN I
Troponin I: <0.03 ng/mL (ref <0.031)
Troponin I: <0.03 ng/mL (ref <0.031)

## 2015-11-20 LAB — LIPASE, BLOOD: Lipase: 19 U/L (ref 11–51)

## 2015-11-20 MED ORDER — ASPIRIN 81 MG PO CHEW
324.0000 mg | CHEWABLE_TABLET | Freq: Once | ORAL | Status: AC
  Administered 2015-11-20: 324 mg via ORAL
  Filled 2015-11-20: qty 4

## 2015-11-20 NOTE — ED Notes (Signed)
[facility] from home for chest pain. Patient states that she started having chest pain last night before bed she chewed a baby aspirin, pain was still there this morning when she woke up. Patient reports taking 2 nitro tabs before calling EMS. Patient currently c/o 2/10 chest pain

## 2015-11-20 NOTE — ED Provider Notes (Signed)
[facility] Emergency Department Provider Note  Time seen: 11:21 AM  I have reviewed the triage vital signs and the nursing notes.   HISTORY  Chief Complaint Chest Pain    HPI Tamara [name] is a 75 y.o. female with a past medical history of SVT, anemia, hypertension, CHF, diabetes, who presents the emergency department with chest pain. According to the patient beginning last night she felt a central to left-sided chest discomfort which she describes as a dull aching pain. States she took aspirin last night and it eased off, she was able to sleep but awoke with the pain this morning. States she took 2 nitroglycerin tablets this morning with minimal relief, became nauseated and diaphoretic so came to the hospital via EMS.Denies shortness of breath. Denies leg pain or swelling. Denies abdominal pain. States she felt nauseated after taking nitroglycerin but denies vomiting. Denies fever, cough or congestion.  Past Medical History  Diagnosis Date  . Asthma     allergy related  . Bronchitis   . SVT (supraventricular tachycardia) (HCC)   . Diverticulitis   . Gout   . Anemia     distant hx of  . Wheezing   . Chronic cough   . Palpitations   . Shortness of breath dyspnea   . Complication of anesthesia     BP bottomed out during Achilles Tendon repair >39yrs ago  . Hypertension   . Pneumonia     hx of  . 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 CHF (congestive heart failure) (HCC)     stopped using diuretics due to kidney issues, watching salt intake  . Diabetes mellitus without complication (HCC)     type 2  . 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 Hypothyroidism   . Arthritis     KNEES,HANDS,WRIST  . Arthritis     KNEE PAIN, BONE ON BONE    There are no active problems to display for this patient.   Past Surgical History  Procedure Laterality Date  .  Cholecystectomy    . Achilles tendon repair    . Abdominal hysterectomy    . Cardiac catheterization      [place]  . Cataract extraction w/phaco Left 02/10/2015    Procedure: CATARACT EXTRACTION PHACO AND INTRAOCULAR LENS PLACEMENT (IOC);  Surgeon: Lockie Mola, MD;  Location: [facility];  Service: Ophthalmology;  Laterality: Left;  DIABETIC  . Cataract extraction w/phaco Right 02/24/2015    Procedure: CATARACT EXTRACTION PHACO AND INTRAOCULAR LENS PLACEMENT (IOC);  Surgeon: Lockie Mola, MD;  Location: [facility];  Service: Ophthalmology;  Laterality: Right;    Current Outpatient Rx  Name  Route  Sig  Dispense  Refill  . acetaminophen (TYLENOL) 500 MG tablet   Oral   Take 1,000 mg by mouth 2 (two) times daily.         . carvedilol (COREG) 3.125 MG tablet   Oral   Take 3.125 mg by mouth 2 (two) times daily with a meal.         . glipiZIDE (GLUCOTROL XL) 2.5 MG 24 hr tablet   Oral   Take 2.5 mg by mouth daily with breakfast.          . ibuprofen (ADVIL,MOTRIN) 200 MG tablet   Oral   Take 200 mg by mouth as needed.  pm         . levothyroxine (SYNTHROID, LEVOTHROID) 150 MCG tablet   Oral   Take 150 mcg by mouth daily before breakfast.           Allergies Codeine  No family history on file.  Social History Social History  Substance Use Topics  . Smoking status: Never Smoker   . Smokeless tobacco: None  . Alcohol Use: No    Review of Systems Constitutional: Negative for fever. Cardiovascular: Positive for chest pain Respiratory: Negative for shortness of breath. Gastrointestinal: Negative for abdominal pain Musculoskeletal: Negative for back pain. Neurological: Negative for headache 10-point ROS otherwise negative.  ____________________________________________   PHYSICAL EXAM:  VITAL SIGNS: ED Triage Vitals  Enc Vitals Group     BP 11/20/15 1108 144/67 mmHg     Pulse Rate 11/20/15 1108 65     Resp 11/20/15 1108  14     Temp 11/20/15 1108 98.1 F (36.7 C)     Temp Source 11/20/15 1108 Oral     SpO2 11/20/15 1103 98 %     Weight 11/20/15 1108 262 lb 5.6 oz (119 kg)     Height 11/20/15 1108 5\' 2"  (1.575 m)     Head Cir --      Peak Flow --      Pain Score 11/20/15 1118 2     Pain Loc --      Pain Edu? --      Excl. in GC? --     Constitutional: Alert and oriented. Well appearing and in no distress. Eyes: Normal exam ENT   Head: Normocephalic and atraumatic   Mouth/Throat: Mucous membranes are moist. Cardiovascular: Normal rate, regular rhythm. No murmur Respiratory: Normal respiratory effort without tachypnea nor retractions. Breath sounds are clear. Nontender to palpation. Gastrointestinal: Soft and nontender. No distention.   Musculoskeletal: Nontender with normal range of motion in all extremities. Pedal edema bilaterally. No Tenderness. Neurologic:  Normal speech and language. No gross focal neurologic deficits  Skin:  Skin is warm, dry and intact.  Psychiatric: Mood and affect are normal. Speech and behavior are normal.   ____________________________________________    EKG  EKG reviewed and interpreted by myself shows normal sinus rhythm at 62 bpm, widened QRS, left axis deviation, likely right bundle branch block. Nonspecific ST changes.  ____________________________________________    RADIOLOGY  Chest x-ray shows no acute abnormalities.  ____________________________________________    INITIAL IMPRESSION / ASSESSMENT AND PLAN / ED COURSE  Pertinent labs & imaging results that were available during my care of the patient were reviewed by me and considered in my medical decision making (see chart for details).  Patient just emergency department chest pain. Currently describes chest pain as a 2/10 dull aching sensation to her left chest. Patient took nitroglycerin with minimal relief. We'll dose aspirin, pain labs and chest x-ray. EKG shows no acute  abnormality.  Chest x-ray negative. Labs within normal limits including negative troponin. Given the patient's age and comorbidities I discussed with the patient my desire to admit her to the hospital for further monitoring. Patient states her [place] recent suffered an aneurysm, [place] and [place] trying to fly down there. She strongly does not wish to be admitted to the hospital states the chest pain is gone. She is agreeable to a repeat troponin 2 hours after the first troponin. As long as the troponin remains negative we will discharge the patient home, she promises to follow-up  with her doctor soon as possible.  Repeat troponin negative. Again discussed admission to the hospital given the patient's comorbidities chest pain however she strongly wishes to go home so she can fly to [place]. We'll discharge home with primary care follow-up. I discussed my normal chest pain return precautions. ____________________________________________   FINAL CLINICAL IMPRESSION(S) / ED DIAGNOSES  Chest pain   Minna Antis, MD 11/20/15 [phone]

## 2015-11-20 NOTE — Discharge Instructions (Signed)
You have been seen in the emergency department today for chest pain. Your workup has shown normal results. As we discussed please follow-up with your primary care physician in the next 1-2 days for recheck. Return to the emergency department for any further chest pain, trouble breathing, or any other symptom personally concerning to yourself. ° °Nonspecific Chest Pain °It is often hard to find the cause of chest pain. There is always a chance that your pain could be related to something serious, such as a heart attack or a blood clot in your lungs. Chest pain can also be caused by conditions that are not life-threatening. If you have chest pain, it is very important to follow up with your doctor. ° °HOME CARE °· If you were prescribed an antibiotic medicine, finish it all even if you start to feel better. °· Avoid any activities that cause chest pain. °· Do not use any tobacco products, including cigarettes, chewing tobacco, or electronic cigarettes. If you need help quitting, ask your doctor. °· Do not drink alcohol. °· Take medicines only as told by your doctor. °· Keep all follow-up visits as told by your doctor. This is important. This includes any further testing if your chest pain does not go away. °· Your doctor may tell you to keep your head raised (elevated) while you sleep. °· Make lifestyle changes as told by your doctor. These may include: °¨ Getting regular exercise. Ask your doctor to suggest some activities that are safe for you. °¨ Eating a heart-healthy diet. Your doctor or a diet specialist (dietitian) can help you to learn healthy eating options. °¨ Maintaining a healthy weight. °¨ Managing diabetes, if necessary. °¨ Reducing stress. °GET HELP IF: °· Your chest pain does not go away, even after treatment. °· You have a rash with blisters on your chest. °· You have a fever. °GET HELP RIGHT AWAY IF: °· Your chest pain is worse. °· You have an increasing cough, or you cough up blood. °· You have  severe belly (abdominal) pain. °· You feel extremely weak. °· You pass out (faint). °· You have chills. °· You have sudden, unexplained chest discomfort. °· You have sudden, unexplained discomfort in your arms, back, neck, or jaw. °· You have shortness of breath at any time. °· You suddenly start to sweat, or your skin gets clammy. °· You feel nauseous. °· You vomit. °· You suddenly feel light-headed or dizzy. °· Your heart begins to beat quickly, or it feels like it is skipping beats. °These symptoms may be an emergency. Do not wait to see if the symptoms will go away. Get medical help right away. Call your local emergency services (911 in the [place] Do not drive yourself to the hospital. °  °This information is not intended to replace advice given to you by your health care provider. Make sure you discuss any questions you have with your health care provider. °  °Document Released: 01/31/2008 Document Revised: 09/04/2014 Document Reviewed: 03/20/2014 °Elsevier Interactive Patient Education ©2016 [employer] ° °

## 2016-02-18 ENCOUNTER — Telehealth: Payer: Self-pay | Admitting: Internal Medicine

## 2016-02-18 NOTE — Telephone Encounter (Signed)
[facility] for appointment on 03/01/16 with Dr Rennis GoldenHilty.  [facility] for Dr Blanchie DessertHilty's schedule on 03/01/16. lp

## 2016-02-21 ENCOUNTER — Telehealth: Payer: Self-pay | Admitting: Internal Medicine

## 2016-02-21 NOTE — Telephone Encounter (Signed)
02/21/2016 Received faxed [facility] from [facility] for upcoming appointment with Dr. Rennis GoldenHilty on 03/01/2016.  [facility]

## 2016-03-01 ENCOUNTER — Encounter: Payer: Self-pay | Admitting: Internal Medicine

## 2016-03-01 ENCOUNTER — Ambulatory Visit (INDEPENDENT_AMBULATORY_CARE_PROVIDER_SITE_OTHER): Payer: Medicare Other | Admitting: Internal Medicine

## 2016-03-01 ENCOUNTER — Telehealth: Payer: Self-pay | Admitting: Internal Medicine

## 2016-03-01 VITALS — BP 116/62 | HR 73 | Ht 62.0 in | Wt 254.8 lb

## 2016-03-01 DIAGNOSIS — Z01810 Encounter for preprocedural cardiovascular examination: Secondary | ICD-10-CM

## 2016-03-01 DIAGNOSIS — I471 Supraventricular tachycardia, unspecified: Secondary | ICD-10-CM | POA: Insufficient documentation

## 2016-03-01 DIAGNOSIS — R079 Chest pain, unspecified: Secondary | ICD-10-CM | POA: Diagnosis not present

## 2016-03-01 NOTE — Patient Instructions (Signed)
Medication Instructions:  No Changes  Labwork: None  Testing/Procedures: Your physician has requested that you have a lexiscan myoview. For further information please visit [email or web address]. Please follow instruction sheet, as given.   Follow-Up: Your physician recommends that you schedule a follow-up appointment AS NEEDED   Any Other Special Instructions Will Be Listed Below (If Applicable).     If you need a refill on your cardiac medications before your next appointment, please call your pharmacy.

## 2016-03-01 NOTE — Telephone Encounter (Signed)
Faxed Release signed by patient to Dr Horton ChinShamil Morayati to obtain [facility]  Faxed to [phone]. lp

## 2016-03-01 NOTE — Telephone Encounter (Signed)
[facility] as requested by Dr Rennis GoldenHilty.  Records given to Dr Rennis GoldenHilty for review. lp

## 2016-03-01 NOTE — Telephone Encounter (Signed)
[facility] Dr Rennis GoldenHilty.  Faxed to [phone] on 03/01/16. lp

## 2016-03-01 NOTE — Progress Notes (Signed)
OFFICE NOTE  Chief Complaint:  Preoperative cardiac evaluation  Primary Care Physician: Alan MulderMORAYATI,SHAMIL J, MD  HPI:  Tamara [name] is a 75 y.o. female with a history of borderline cardiomegaly, dyspnea, palpitations and a documented SVT, hypothyroidism, possible congestive heart failure, type 2 diabetes and borderline dyslipidemia. She's not been followed by cardiologist, rather her primary doctor who is certified a nuclear medicine and endocrinology has done cardiac evaluations in the past according to the patient. She says that she had a stress test about 3 years ago which was reportedly negative. She's also had echocardiograms of her heart and [facility] although those are not in the computer system for me to review directly. Recently she's been having problem with her knee and is contemplating surgery with Dr. Frederico Hammananiel Caffrey. She saw her primary care provider on 02/18/2016 for preoperative evaluation. Despite her findings in the past he felt that her heart was okay, but Dr. Madelon Lipsaffrey wish to have her evaluated by me. Of note, Tamara [name] did present with acute chest pain in March 2017. Workup was negative for ischemia and she ruled out for MI in the emergency department and was discharged. She did not have any testing of her heart at that time or subsequent to that. Unfortunately, her son suffered a cerebral hemorrhage and was very ill at the time and she attributed to that stress.  PMHx:  Past Medical History  Diagnosis Date  . Asthma     allergy related  . Bronchitis   . SVT (supraventricular tachycardia) (HCC)   . Diverticulitis   . Gout   . Anemia     distant hx of  . Wheezing   . Chronic cough   . Palpitations   . Shortness of breath dyspnea   . Complication of anesthesia     BP bottomed out during Achilles Tendon repair >7284yrs ago  . Hypertension   . Pneumonia     hx of  . 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 CHF (congestive heart failure)  (HCC)     stopped using diuretics due to kidney issues, watching salt intake  . Diabetes mellitus without complication (HCC)     type 2  . 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 Hypothyroidism   . Arthritis     KNEES,HANDS,WRIST  . Arthritis     KNEE PAIN, BONE ON BONE  . Cardiomegaly   . Obesity     Past Surgical History  Procedure Laterality Date  . Cholecystectomy    . Achilles tendon repair    . Abdominal hysterectomy    . Cardiac catheterization      [place]  . Cataract extraction w/phaco Left 02/10/2015    Procedure: CATARACT EXTRACTION PHACO AND INTRAOCULAR LENS PLACEMENT (IOC);  Surgeon: Lockie Molahadwick Brasington, MD;  [facility];  Service: Ophthalmology;  Laterality: Left;  DIABETIC  . Cataract extraction w/phaco Right 02/24/2015    Procedure: CATARACT EXTRACTION PHACO AND INTRAOCULAR LENS PLACEMENT (IOC);  Surgeon: Lockie Molahadwick Brasington, MD;  [facility];  Service: Ophthalmology;  Laterality: Right;    FAMHx:  Family History  Problem Relation Age of Onset  . Lung cancer Mother   . Heart attack Mother   . Heart Problems Father   . Hypertension Father   . Other Father     respiratory  problems  . Ovarian cancer Maternal Grandmother   . Cancer Maternal Grandfather   . Stroke Paternal Grandmother   . Diabetes Brother   . Liver cancer Brother   . COPD Sister   . Other Sister     sepsis  . Heart attack Son   . Heart attack Son   . Rheum arthritis Daughter   . Rheum arthritis Daughter   . Thyroid cancer Daughter   . Hepatitis C Daughter     SOCHx:   reports that she has never smoked. She does not have any smokeless tobacco history on file. She reports that she does not drink alcohol. Her drug history is not on file.  ALLERGIES:  Allergies  Allergen Reactions  . Codeine Nausea And Vomiting    ROS: Pertinent items noted in HPI and remainder of  comprehensive ROS otherwise negative.  HOME MEDS: Current Outpatient Prescriptions  Medication Sig Dispense Refill  . acetaminophen (TYLENOL) 500 MG tablet Take 1,000 mg by mouth 2 (two) times daily.    . canagliflozin (INVOKANA) 300 MG TABS tablet Take 300 mg by mouth daily before breakfast.    . carvedilol (COREG) 3.125 MG tablet Take 3.125 mg by mouth 2 (two) times daily with a meal.    . furosemide (LASIX) 20 MG tablet Take 20 mg by mouth daily.    Marland Kitchen glipiZIDE (GLUCOTROL XL) 2.5 MG 24 hr tablet Take 2.5 mg by mouth daily with breakfast.     . ibuprofen (ADVIL,MOTRIN) 200 MG tablet Take 200 mg by mouth as needed. pm    . levothyroxine (SYNTHROID, LEVOTHROID) 150 MCG tablet Take 150 mcg by mouth daily before breakfast.    . nitroGLYCERIN (NITROSTAT) 0.4 MG SL tablet Place 0.4 mg under the tongue every 5 (five) minutes as needed for chest pain. Max 3    . ONE TOUCH ULTRA TEST test strip Apply 1 strip topically as directed.    . traMADol (ULTRAM) 50 MG tablet Take 1 tablet by mouth every 8 (eight) hours as needed.  2   No current facility-administered medications for this visit.    LABS/IMAGING: No results found for this or any previous visit (from the past 48 hour(s)). No results found.  WEIGHTS: Wt Readings from Last 3 Encounters:  03/01/16 254 lb 12.8 oz (115.577 kg)  11/20/15 262 lb 5.6 oz (119 kg)  02/24/15 271 lb (122.925 kg)    VITALS: BP 116/62 mmHg  Pulse 73  Ht  (1.575 m)  Wt 254 lb 12.8 oz (115.577 kg)  BMI 46.59 kg/m2  SpO2 96%  EXAM: General appearance: alert, no distress and morbidly obese Neck: no carotid bruit and no JVD Lungs: clear to auscultation bilaterally Heart: regular rate and rhythm, S1, S2 normal, no murmur, click, rub or gallop Abdomen: soft, non-tender; bowel sounds normal; no masses,  no organomegaly and obese Extremities: extremities normal, atraumatic, no cyanosis or edema Pulses: 2+ and symmetric Skin: Skin color, texture, turgor  normal. No rashes or lesions Neurologic: Grossly normal Psych: Pleasant  EKG: I personally reviewed EKG from her primary care provider on 02/07/2016 which showed sinus rhythm and no ischemic changes  ASSESSMENT: 1. Indeterminate preoperative risk 2. Recent chest pain 3. Morbid obesity 4. Borderline cardiomegaly 5. Type 2 diabetes 6. History of SVT-controlled on carvedilol  PLAN: 1.   Tamara [name] is contemplating upcoming knee surgery. She's had stress testing in the past but it's been about 3 years ago. She recently had an episode of chest pain which  was attributed to her son being very ill and stress although she was seen in the ER for what sounded like central chest pain that radiated to her back and her shoulder. She did rule out for MI. She's not been evaluated since this episode from a coronary standpoint and does have a family history of heart disease in both parents. I'm recommending a repeat Lexiscan nuclear stress test that she is unable to walk on a treadmill with her knee problem. We will also obtain records from her primary care provider for direct comparison of her prior nuclear stress test and echocardiogram some years ago.  I will be in contact with the results of the study and can further determine her risk based on those results. Thanks for the kind referral.  Chrystie NoseKenneth C. Hilty, MD, [facility] Attending Cardiologist [facility]  Chrystie NoseKenneth C Hilty 03/01/2016, 10:53 AM

## 2016-03-14 ENCOUNTER — Telehealth (HOSPITAL_COMMUNITY): Payer: Self-pay

## 2016-03-14 NOTE — Telephone Encounter (Signed)
Encounter complete. 

## 2016-03-16 ENCOUNTER — Ambulatory Visit (HOSPITAL_COMMUNITY)
Admission: RE | Admit: 2016-03-16 | Discharge: 2016-03-16 | Disposition: A | Discharge status: Home / Self Care | Payer: Medicare Other | Source: Ambulatory Visit | Attending: Cardiovascular Disease | Admitting: Cardiovascular Disease

## 2016-03-16 DIAGNOSIS — R079 Chest pain, unspecified: Secondary | ICD-10-CM

## 2016-03-16 DIAGNOSIS — E669 Obesity, unspecified: Secondary | ICD-10-CM | POA: Insufficient documentation

## 2016-03-16 DIAGNOSIS — Z6841 Body Mass Index (BMI) 40.0 and over, adult: Secondary | ICD-10-CM | POA: Insufficient documentation

## 2016-03-16 DIAGNOSIS — E119 Type 2 diabetes mellitus without complications: Secondary | ICD-10-CM | POA: Insufficient documentation

## 2016-03-16 DIAGNOSIS — R0609 Other forms of dyspnea: Secondary | ICD-10-CM | POA: Diagnosis not present

## 2016-03-16 DIAGNOSIS — I10 Essential (primary) hypertension: Secondary | ICD-10-CM | POA: Insufficient documentation

## 2016-03-16 DIAGNOSIS — R0602 Shortness of breath: Secondary | ICD-10-CM | POA: Insufficient documentation

## 2016-03-16 DIAGNOSIS — R002 Palpitations: Secondary | ICD-10-CM | POA: Insufficient documentation

## 2016-03-16 DIAGNOSIS — Z8249 Family history of ischemic heart disease and other diseases of the circulatory system: Secondary | ICD-10-CM | POA: Diagnosis not present

## 2016-03-16 DIAGNOSIS — Z01810 Encounter for preprocedural cardiovascular examination: Secondary | ICD-10-CM | POA: Insufficient documentation

## 2016-03-16 MED ORDER — AMINOPHYLLINE 25 MG/ML IV SOLN
100.0000 mg | Freq: Once | INTRAVENOUS | Status: AC
  Administered 2016-03-16: 100 mg via INTRAVENOUS

## 2016-03-16 MED ORDER — REGADENOSON 0.4 MG/5ML IV SOLN
0.4000 mg | Freq: Once | INTRAVENOUS | Status: AC
  Administered 2016-03-16: 0.4 mg via INTRAVENOUS

## 2016-03-16 MED ORDER — TECHNETIUM TC 99M TETROFOSMIN IV KIT
28.7000 | PACK | Freq: Once | INTRAVENOUS | Status: AC | PRN
  Administered 2016-03-16: 29 via INTRAVENOUS
  Filled 2016-03-16: qty 29

## 2016-03-17 ENCOUNTER — Ambulatory Visit (HOSPITAL_COMMUNITY)
Admission: RE | Admit: 2016-03-17 | Discharge: 2016-03-17 | Disposition: A | Discharge status: Home / Self Care | Payer: Medicare Other | Source: Ambulatory Visit | Attending: Cardiology | Admitting: Cardiology

## 2016-03-17 LAB — MYOCARDIAL PERFUSION IMAGING
CHL CUP NUCLEAR SRS: 4
CHL CUP NUCLEAR SSS: 4
CHL CUP RESTING HR STRESS: 65 {beats}/min
LV dias vol: 71 mL (ref 46–106)
LV sys vol: 27 mL
Peak HR: 86 {beats}/min
SDS: 0
TID: 0.99

## 2016-03-17 MED ORDER — TECHNETIUM TC 99M TETROFOSMIN IV KIT
29.4000 | PACK | Freq: Once | INTRAVENOUS | Status: AC | PRN
  Administered 2016-03-17: 29 via INTRAVENOUS

## 2016-03-29 ENCOUNTER — Telehealth: Payer: Self-pay | Admitting: Internal Medicine

## 2016-03-29 NOTE — Telephone Encounter (Signed)
1. Type of surgery: right total knee replacement  2. Date of surgery: pending 3. Surgeon: Dr. Frederico Hamman 4. Medications that need to be held & how long: none specified  5. Fax and/or Phone: (p) [phone] ext: [phone]  (f) [phone] Attn: Tresa Endo  Routed stress test results to Dr. Madelon Lips via EPIC on 03/20/16  Delbert Harness requests: recent notes, labs, EKG, special studies

## 2016-03-29 NOTE — Telephone Encounter (Signed)
Low risk for knee surgery - low risk stress test. Ok to clear.  Dr. Rexene Edison

## 2016-03-29 NOTE — Telephone Encounter (Signed)
Routed clearance note, last office note, stress test report to Dr. Candise Bowens office + Tresa Endo

## 2016-04-19 ENCOUNTER — Ambulatory Visit: Payer: Self-pay | Admitting: Physician Assistant

## 2016-04-19 NOTE — H&P (Signed)
TOTAL KNEE ADMISSION H&P  Patient is being admitted for right total knee arthroplasty.  Subjective:  Chief Complaint:right knee pain.  HPI: Tamara [name], 75 y.o. female, has a history of pain and functional disability in the right knee due to arthritis and has failed non-surgical conservative treatments for greater than 12 weeks to includeNSAID's and/or analgesics, corticosteriod injections, viscosupplementation injections, use of assistive devices and activity modification.  Onset of symptoms was gradual, starting >10 years ago with gradually worsening course since that time. The patient noted no past surgery on the right knee(s).  Patient currently rates pain in the right knee(s) at 10 out of 10 with activity. Patient has night pain, worsening of pain with activity and weight bearing, pain that interferes with activities of daily living, pain with passive range of motion, crepitus and joint swelling.  Patient has evidence of periarticular osteophytes and joint space narrowing by imaging studies. There is no active infection.  Patient Active Problem List   Diagnosis Date Noted  . Paroxysmal SVT (supraventricular tachycardia) (HCC) 03/01/2016  . Preprocedural cardiovascular examination 03/01/2016  . Pain in the chest 03/01/2016   Past Medical History:  Diagnosis Date  . Anemia    distant hx of  . Arthritis    KNEES,HANDS,WRIST  . Arthritis    KNEE PAIN, BONE ON BONE  . Asthma    allergy related  . 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 Bronchitis   . Cardiomegaly   . CHF (congestive heart failure) (HCC)    stopped using diuretics due to kidney issues, watching salt intake  . Chronic cough   . Complication of anesthesia    BP bottomed out during Achilles Tendon repair >10yrs ago  . Diabetes mellitus without complication (HCC)    type 2  . Diverticulitis   . Gout   . Hepatitis    childhood Hep A 75 yrs old  . Hypertension   . Hypothyroidism   . 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 Obesity   . Palpitations   . Pneumonia    hx of  . Shortness of breath dyspnea   . SVT (supraventricular tachycardia) (HCC)   . Wheezing     Past Surgical History:  Procedure Laterality Date  . ABDOMINAL HYSTERECTOMY    . ABDOMINAL HYSTERECTOMY  1984  . ACHILLES TENDON REPAIR    . CARDIAC CATHETERIZATION     [place]  . CATARACT EXTRACTION W/PHACO Left 02/10/2015   Procedure: CATARACT EXTRACTION PHACO AND INTRAOCULAR LENS PLACEMENT (IOC);  Surgeon: Chadwick Brasington, MD;  Location: [facility];  Service: Ophthalmology;  Laterality: Left;  DIABETIC  . CATARACT EXTRACTION W/PHACO Right 02/24/2015   Procedure: CATARACT EXTRACTION PHACO AND INTRAOCULAR LENS PLACEMENT (IOC);  Surgeon: Chadwick Brasington, MD;  Location: [facility];  Service: Ophthalmology;  Laterality: Right;  . CESAREAN SECTION  1978  . CHOLECYSTECTOMY  1986     (Not in a hospital admission) Allergies  Allergen Reactions  . Codeine Nausea And Vomiting    Social History  Substance Use Topics  . Smoking status: Never Smoker  . Smokeless tobacco: Not on file  . Alcohol use No    Family History  Problem Relation Age of Onset  . Lung cancer Mother   . Heart attack Mother   . Heart Problems Father   . Hypertension Father   . Other Father     respiratory problems  . Ovarian cancer   Maternal Grandmother   . Cancer Maternal Grandfather   . Stroke Paternal Grandmother   . Diabetes Brother   . Liver cancer Brother   . COPD Sister   . Other Sister     sepsis  . Heart attack Son   . Heart attack Son   . Rheum arthritis Daughter   . Rheum arthritis Daughter   . Thyroid cancer Daughter   . Hepatitis C Daughter      Review of Systems  Respiratory: Positive for cough and shortness of breath.   Cardiovascular: Positive for chest pain, palpitations and leg swelling.  Gastrointestinal: Positive for blood in stool, constipation, diarrhea, nausea and  vomiting.  Genitourinary: Positive for dysuria, frequency and hematuria.  Musculoskeletal: Positive for joint pain.  Neurological: Positive for dizziness, loss of consciousness and headaches.  Endo/Heme/Allergies: Bruises/bleeds easily.  All other systems reviewed and are negative.   Objective:  Physical Exam  Constitutional: She is oriented to person, place, and time. She appears well-developed and well-nourished. No distress.  HENT:  Head: Normocephalic and atraumatic.  Nose: Nose normal.  Eyes: Conjunctivae and EOM are normal. Pupils are equal, round, and reactive to light.  Neck: Normal range of motion. Neck supple.  Cardiovascular: Normal rate, regular rhythm, normal heart sounds and intact distal pulses.   Respiratory: Effort normal and breath sounds normal. No respiratory distress. She has no wheezes.  GI: Soft. Bowel sounds are normal. She exhibits no distension. There is no tenderness.  Musculoskeletal:       Right knee: She exhibits swelling. She exhibits normal range of motion. Tenderness found.  Lymphadenopathy:    She has no cervical adenopathy.  Neurological: She is alert and oriented to person, place, and time. No cranial nerve deficit.  Skin: Skin is warm and dry. No rash noted. No erythema.  Psychiatric: She has a normal mood and affect. Her behavior is normal.    Vital signs in last 24 hours: @VSRANGES @  Labs:   Estimated body mass index is 46.46 kg/m as calculated from the following:   Height as of 03/16/16: 5\' 2"  (1.575 m).   Weight as of 03/16/16: 115.2 kg (254 lb).   Imaging Review Plain radiographs demonstrate severe degenerative joint disease of the right knee(s). The overall alignment issignificant varus. The bone quality appears to be good for age and reported activity level.  Assessment/Plan:  End stage arthritis, right knee   The patient history, physical examination, clinical judgment of the provider and imaging studies are consistent with  end stage degenerative joint disease of the right knee(s) and total knee arthroplasty is deemed medically necessary. The treatment options including medical management, injection therapy arthroscopy and arthroplasty were discussed at length. The risks and benefits of total knee arthroplasty were presented and reviewed. The risks due to aseptic loosening, infection, stiffness, patella tracking problems, thromboembolic complications and other imponderables were discussed. The patient acknowledged the explanation, agreed to proceed with the plan and consent was signed. Patient is being admitted for inpatient treatment for surgery, pain control, PT, OT, prophylactic antibiotics, VTE prophylaxis, progressive ambulation and ADL's and discharge planning. The patient is planning to be discharged home with home health services

## 2016-05-02 ENCOUNTER — Encounter (HOSPITAL_COMMUNITY)
Admission: RE | Admit: 2016-05-02 | Discharge: 2016-05-02 | Disposition: A | Discharge status: Home / Self Care | Payer: Medicare Other | Source: Ambulatory Visit | Attending: Physician Assistant | Admitting: Physician Assistant

## 2016-05-02 ENCOUNTER — Encounter (HOSPITAL_COMMUNITY): Payer: Self-pay

## 2016-05-02 ENCOUNTER — Encounter (HOSPITAL_COMMUNITY)
Admission: RE | Admit: 2016-05-02 | Discharge: 2016-05-02 | Disposition: A | Discharge status: Home / Self Care | Payer: Medicare Other | Source: Ambulatory Visit | Attending: Orthopedic Surgery | Admitting: Orthopedic Surgery

## 2016-05-02 DIAGNOSIS — Z0183 Encounter for blood typing: Secondary | ICD-10-CM | POA: Insufficient documentation

## 2016-05-02 DIAGNOSIS — Z01812 Encounter for preprocedural laboratory examination: Secondary | ICD-10-CM | POA: Insufficient documentation

## 2016-05-02 DIAGNOSIS — M1711 Unilateral primary osteoarthritis, right knee: Secondary | ICD-10-CM

## 2016-05-02 DIAGNOSIS — Z01818 Encounter for other preprocedural examination: Secondary | ICD-10-CM | POA: Diagnosis not present

## 2016-05-02 HISTORY — DX: Chronic kidney disease, unspecified: N18.9

## 2016-05-02 HISTORY — DX: Drug induced constipation: K59.03

## 2016-05-02 HISTORY — DX: Personal history of other medical treatment: Z92.89

## 2016-05-02 LAB — URINE MICROSCOPIC-ADD ON

## 2016-05-02 LAB — COMPREHENSIVE METABOLIC PANEL
ALT: 14 U/L (ref 14–54)
AST: 20 U/L (ref 15–41)
Albumin: 3.6 g/dL (ref 3.5–5.0)
Alkaline Phosphatase: 56 U/L (ref 38–126)
Anion gap: 8 (ref 5–15)
BILIRUBIN TOTAL: 0.6 mg/dL (ref 0.3–1.2)
BUN: 29 mg/dL — AB (ref 6–20)
CO2: 23 mmol/L (ref 22–32)
CREATININE: 1.23 mg/dL — AB (ref 0.44–1.00)
Calcium: 9.7 mg/dL (ref 8.9–10.3)
Chloride: 106 mmol/L (ref 101–111)
GFR calc Af Amer: 48 mL/min — ABNORMAL LOW (ref >60)
GFR, EST NON AFRICAN AMERICAN: 42 mL/min — AB (ref >60)
Glucose, Bld: 116 mg/dL — ABNORMAL HIGH (ref 65–99)
Potassium: 3.6 mmol/L (ref 3.5–5.1)
Sodium: 137 mmol/L (ref 135–145)
TOTAL PROTEIN: 7.2 g/dL (ref 6.5–8.1)

## 2016-05-02 LAB — APTT: APTT: 35 s (ref 24–36)

## 2016-05-02 LAB — CBC WITH DIFFERENTIAL/PLATELET
BASOS ABS: 0 10*3/uL (ref 0.0–0.1)
Basophils Relative: 0 %
Eosinophils Absolute: 0 10*3/uL (ref 0.0–0.7)
Eosinophils Relative: 0 %
HEMATOCRIT: 41.3 % (ref 36.0–46.0)
Hemoglobin: 12.9 g/dL (ref 12.0–15.0)
LYMPHS PCT: 22 %
Lymphs Abs: 1.8 10*3/uL (ref 0.7–4.0)
MCH: 27.5 pg (ref 26.0–34.0)
MCHC: 31.2 g/dL (ref 30.0–36.0)
MCV: 88.1 fL (ref 78.0–100.0)
Monocytes Absolute: 0.6 10*3/uL (ref 0.1–1.0)
Monocytes Relative: 7 %
NEUTROS ABS: 6 10*3/uL (ref 1.7–7.7)
Neutrophils Relative %: 71 %
Platelets: 305 10*3/uL (ref 150–400)
RBC: 4.69 MIL/uL (ref 3.87–5.11)
RDW: 15.3 % (ref 11.5–15.5)
WBC: 8.4 10*3/uL (ref 4.0–10.5)

## 2016-05-02 LAB — URINALYSIS, ROUTINE W REFLEX MICROSCOPIC
Bilirubin Urine: NEGATIVE
Glucose, UA: >1000 mg/dL — AB
KETONES UR: NEGATIVE mg/dL
NITRITE: NEGATIVE
PH: 5 (ref 5.0–8.0)
Protein, ur: NEGATIVE mg/dL
SPECIFIC GRAVITY, URINE: 1.034 — AB (ref 1.005–1.030)

## 2016-05-02 LAB — GLUCOSE, CAPILLARY: Glucose-Capillary: 186 mg/dL — ABNORMAL HIGH (ref 65–99)

## 2016-05-02 LAB — SURGICAL PCR SCREEN
MRSA, PCR: NEGATIVE
Staphylococcus aureus: NEGATIVE

## 2016-05-02 LAB — TYPE AND SCREEN
ABO/RH(D): A POS
Antibody Screen: NEGATIVE

## 2016-05-02 LAB — PROTIME-INR
INR: 1.14
PROTHROMBIN TIME: 14.6 s (ref 11.4–15.2)

## 2016-05-02 LAB — ABO/RH: ABO/RH(D): A POS

## 2016-05-02 NOTE — Progress Notes (Signed)
   How to Manage Your Diabetes Before and After Surgery  Why is it important to control my blood sugar before and after surgery? . Improving blood sugar levels before and after surgery helps healing and can limit problems. . A way of improving blood sugar control is eating a healthy diet by: o  Eating less sugar and carbohydrates o  Increasing activity/exercise o  Talking with your doctor about reaching your blood sugar goals . High blood sugars (greater than 180 mg/dL) can raise your risk of infections and slow your recovery, so you will need to focus on controlling your diabetes during the weeks before surgery. . Make sure that the doctor who takes care of your diabetes knows about your planned surgery including the date and location.  How do I manage my blood sugar before surgery? . Check your blood sugar at least 4 times a day, starting 2 days before surgery, to make sure that the level is not too high or low. o Check your blood sugar the morning of your surgery when you wake up and every 2 hours until you get to the Short Stay unit. . If your blood sugar is less than 70 mg/dL, you will need to treat for low blood sugar: o Do not take insulin. o Treat a low blood sugar (less than 70 mg/dL) with  cup of clear juice (cranberry or apple), 4 glucose tablets, OR glucose gel. o Recheck blood sugar in 15 minutes after treatment (to make sure it is greater than 70 mg/dL). If your blood sugar is not greater than 70 mg/dL on recheck, call [phone] for further instructions. . Report your blood sugar to the short stay nurse when you get to Short Stay.  . If you are admitted to the hospital after surgery: o Your blood sugar will be checked by the staff and you will probably be given insulin after surgery (instead of oral diabetes medicines) to make sure you have good blood sugar levels. o The goal for blood sugar control after surgery is 80-180 mg     WHAT DO I DO ABOUT MY DIABETES  MEDICATION?    Insulin Pump Decrease Basal rates by 20% at midnight the day of surgery or as instructed by PCP or Endrocronologist    . If your CBG is greater than 220 mg/dL, you may take  of your sliding scale (correction) dose of insulin.  Other Instructions:Bring all Insulin Pump supplies , in case it should be discontinued   Patient Signature:  Date:   Nurse Signature:  Date:   Reviewed and Endorsed by [facility] Patient Education Committee, August 2015

## 2016-05-02 NOTE — Pre-Procedure Instructions (Addendum)
Tamara [name]  05/02/2016     Your procedure is scheduled on Friday, September 15.  [facility] at 8:55 AM                Your surgery or procedure is scheduled for 10:55 AM   Call this number if you have problems the morning of surgery:[phone]                For any other questions, please call [phone], Monday - Friday 8 AM - 4 PM.   Remember:  Do not eat food or drink liquids after midnight Thursday, September 15.              Take these medicines the morning of surgery with A SIP OF WATER :carvedilol (COREG), levothyroxine (SYNTHROID, LEVOTHROID).               DO NOT TAKE glipiZIDE (GLUCOTROL XL), canagliflozin (INVOKANA) the day of surgery.                 Take if needed:nitroGLYCERIN (NITROSTAT), acetaminophen (TYLENOL).              Stop taking Aspirin, and Herbal medications, Vitamins.  Do not take any NSAIDs ie: Ibuprofen, Advil,Naproxen or any medication containing Aspirin.  How to Manage Your Diabetes Before and After Surgery  Why is it important to control my blood sugar before and after surgery? . Improving blood sugar levels before and after surgery helps healing and can limit problems. . A way of improving blood sugar control is eating a healthy diet by: o  Eating less sugar and carbohydrates o  Increasing activity/exercise o  Talking with your doctor about reaching your blood sugar goals . High blood sugars (greater than 180 mg/dL) can raise your risk of infections and slow your recovery, so you will need to focus on controlling your diabetes during the weeks before surgery. . Make sure that the doctor who takes care of your diabetes knows about your planned surgery including the date and location.  How do I manage my blood sugar before surgery? . Check your blood sugar at least 4 times a day, starting 2 days before surgery, to make sure that the level is not too high or low. o Check your blood sugar the morning of your  surgery when you wake up and every 2 hours until you get to the Short Stay unit. . If your blood sugar is less than 70 mg/dL, you will need to treat for low blood sugar: o Do not take insulin. o Treat a low blood sugar (less than 70 mg/dL) with  cup of clear juice (cranberry or apple), 4 glucose tablets, OR glucose gel. o Recheck blood sugar in 15 minutes after treatment (to make sure it is greater than 70 mg/dL). If your blood sugar is not greater than 70 mg/dL on recheck, call [phone] for further instructions. . Report your blood sugar to the short stay nurse when you get to Short Stay.  . If you are admitted to the hospital after surgery: o Your blood sugar will be checked by the staff and you will probably be given insulin after surgery (instead of oral diabetes medicines) to make sure you have good blood sugar levels. o The goal for blood sugar control after surgery is 80-180 mg/dL WHAT DO I DO ABOUT MY DIABETES MEDICATION?   Marland Kitchen. Do not take oral diabetes medicines (pills) the morning of surgery  Patient Signature:  Date:   Nurse Signature:  Date:    Do not wear jewelry, make-up or nail polish.  Do not wear lotions, powders, or perfumes, or deodorant.  Do not shave 48 hours prior to surgery.   Do not bring valuables to the hospital.  [facility] is not responsible for any belongings or valuables.  Contacts, dentures or bridgework may not be worn into surgery.  Leave your suitcase in the car.  After surgery it may be brought to your room.  For patients admitted to the hospital, discharge time will be determined by your treatment team.  Special instructions:  Review  [place] - Preparing For Surgery.  Please read over the following fact sheets that you were given. [place]- Preparing For Surgery and Patient Instructions for Mupirocin Application, Incentive Spirometery.

## 2016-05-03 LAB — URINE CULTURE

## 2016-05-08 NOTE — Progress Notes (Addendum)
Called and notified Dr.Caffrey of UA and Culture Results. Re-requested last A1C from Dr.Shamil Morayati--office states they never got the first request

## 2016-05-11 MED ORDER — TRANEXAMIC ACID 1000 MG/10ML IV SOLN
1000.0000 mg | INTRAVENOUS | Status: AC
  Administered 2016-05-12: 1000 mg via INTRAVENOUS
  Filled 2016-05-11: qty 10

## 2016-05-11 MED ORDER — TRANEXAMIC ACID 1000 MG/10ML IV SOLN
1000.0000 mg | INTRAVENOUS | Status: DC
  Filled 2016-05-11: qty 10

## 2016-05-11 MED ORDER — DEXTROSE 5 % IV SOLN
3.0000 g | INTRAVENOUS | Status: AC
  Administered 2016-05-12: 3 g via INTRAVENOUS
  Filled 2016-05-11: qty 3000

## 2016-05-11 MED ORDER — SODIUM CHLORIDE 0.9 % IV SOLN
INTRAVENOUS | Status: DC
Start: 2016-05-12 — End: 2016-05-12

## 2016-05-12 ENCOUNTER — Encounter (HOSPITAL_COMMUNITY): Payer: Self-pay | Admitting: *Deleted

## 2016-05-12 ENCOUNTER — Inpatient Hospital Stay (HOSPITAL_COMMUNITY): Payer: Medicare Other | Admitting: Anesthesiology

## 2016-05-12 ENCOUNTER — Encounter (HOSPITAL_COMMUNITY)
Admission: RE | Disposition: A | Discharge status: Skilled Nursing Facility | Payer: Self-pay | Source: Ambulatory Visit | Attending: Orthopedic Surgery

## 2016-05-12 ENCOUNTER — Inpatient Hospital Stay (HOSPITAL_COMMUNITY)
Admission: RE | Admit: 2016-05-12 | Discharge: 2016-05-20 | DRG: 469 | Disposition: A | Discharge status: Skilled Nursing Facility | Payer: Medicare Other | Source: Ambulatory Visit | Attending: Orthopedic Surgery | Admitting: Orthopedic Surgery

## 2016-05-12 DIAGNOSIS — Z7409 Other reduced mobility: Secondary | ICD-10-CM

## 2016-05-12 DIAGNOSIS — Z9841 Cataract extraction status, right eye: Secondary | ICD-10-CM | POA: Diagnosis not present

## 2016-05-12 DIAGNOSIS — Z6841 Body Mass Index (BMI) 40.0 and over, adult: Secondary | ICD-10-CM | POA: Diagnosis not present

## 2016-05-12 DIAGNOSIS — I214 Non-ST elevation (NSTEMI) myocardial infarction: Secondary | ICD-10-CM | POA: Diagnosis not present

## 2016-05-12 DIAGNOSIS — Z961 Presence of intraocular lens: Secondary | ICD-10-CM | POA: Diagnosis present

## 2016-05-12 DIAGNOSIS — J9601 Acute respiratory failure with hypoxia: Secondary | ICD-10-CM | POA: Diagnosis not present

## 2016-05-12 DIAGNOSIS — M1711 Unilateral primary osteoarthritis, right knee: Secondary | ICD-10-CM | POA: Diagnosis present

## 2016-05-12 DIAGNOSIS — R2681 Unsteadiness on feet: Secondary | ICD-10-CM

## 2016-05-12 DIAGNOSIS — D62 Acute posthemorrhagic anemia: Secondary | ICD-10-CM | POA: Diagnosis not present

## 2016-05-12 DIAGNOSIS — E785 Hyperlipidemia, unspecified: Secondary | ICD-10-CM | POA: Diagnosis present

## 2016-05-12 DIAGNOSIS — R9431 Abnormal electrocardiogram [ECG] [EKG]: Secondary | ICD-10-CM | POA: Diagnosis not present

## 2016-05-12 DIAGNOSIS — Y95 Nosocomial condition: Secondary | ICD-10-CM | POA: Diagnosis not present

## 2016-05-12 DIAGNOSIS — J189 Pneumonia, unspecified organism: Secondary | ICD-10-CM | POA: Diagnosis present

## 2016-05-12 DIAGNOSIS — E1122 Type 2 diabetes mellitus with diabetic chronic kidney disease: Secondary | ICD-10-CM | POA: Diagnosis present

## 2016-05-12 DIAGNOSIS — I471 Supraventricular tachycardia: Secondary | ICD-10-CM | POA: Diagnosis present

## 2016-05-12 DIAGNOSIS — E1140 Type 2 diabetes mellitus with diabetic neuropathy, unspecified: Secondary | ICD-10-CM | POA: Diagnosis present

## 2016-05-12 DIAGNOSIS — E039 Hypothyroidism, unspecified: Secondary | ICD-10-CM | POA: Diagnosis present

## 2016-05-12 DIAGNOSIS — Z9842 Cataract extraction status, left eye: Secondary | ICD-10-CM | POA: Diagnosis not present

## 2016-05-12 DIAGNOSIS — I959 Hypotension, unspecified: Secondary | ICD-10-CM | POA: Diagnosis not present

## 2016-05-12 DIAGNOSIS — I130 Hypertensive heart and chronic kidney disease with heart failure and stage 1 through stage 4 chronic kidney disease, or unspecified chronic kidney disease: Secondary | ICD-10-CM | POA: Diagnosis present

## 2016-05-12 DIAGNOSIS — I509 Heart failure, unspecified: Secondary | ICD-10-CM | POA: Diagnosis present

## 2016-05-12 DIAGNOSIS — A419 Sepsis, unspecified organism: Secondary | ICD-10-CM | POA: Diagnosis not present

## 2016-05-12 DIAGNOSIS — J449 Chronic obstructive pulmonary disease, unspecified: Secondary | ICD-10-CM | POA: Diagnosis present

## 2016-05-12 DIAGNOSIS — M25561 Pain in right knee: Secondary | ICD-10-CM | POA: Diagnosis present

## 2016-05-12 DIAGNOSIS — R7989 Other specified abnormal findings of blood chemistry: Secondary | ICD-10-CM

## 2016-05-12 DIAGNOSIS — R7981 Abnormal blood-gas level: Secondary | ICD-10-CM | POA: Diagnosis not present

## 2016-05-12 DIAGNOSIS — R778 Other specified abnormalities of plasma proteins: Secondary | ICD-10-CM

## 2016-05-12 HISTORY — PX: TOTAL KNEE ARTHROPLASTY: SHX125

## 2016-05-12 LAB — GLUCOSE, CAPILLARY
GLUCOSE-CAPILLARY: 116 mg/dL — AB (ref 65–99)
Glucose-Capillary: 92 mg/dL (ref 65–99)
Glucose-Capillary: 113 mg/dL — ABNORMAL HIGH (ref 65–99)

## 2016-05-12 SURGERY — ARTHROPLASTY, KNEE, TOTAL
Anesthesia: General | Laterality: Right

## 2016-05-12 MED ORDER — TRAMADOL HCL 50 MG PO TABS: 100.0000 mg | ORAL_TABLET | Freq: Four times a day (QID) | ORAL | 0 refills | Status: DC | PRN

## 2016-05-12 MED ORDER — ACETAMINOPHEN 500 MG PO TABS
1000.0000 mg | ORAL_TABLET | Freq: Every day | ORAL | Status: DC
  Administered 2016-05-13 – 2016-05-14 (×2): 1000 mg via ORAL
  Filled 2016-05-12 (×3): qty 2

## 2016-05-12 MED ORDER — CEFAZOLIN SODIUM-DEXTROSE 2-4 GM/100ML-% IV SOLN
2.0000 g | Freq: Four times a day (QID) | INTRAVENOUS | Status: AC
  Administered 2016-05-12 (×2): 2 g via INTRAVENOUS
  Filled 2016-05-12 (×2): qty 100

## 2016-05-12 MED ORDER — HYDROMORPHONE HCL 1 MG/ML IJ SOLN
INTRAMUSCULAR | Status: AC
  Filled 2016-05-12: qty 1

## 2016-05-12 MED ORDER — MIDAZOLAM HCL 2 MG/2ML IJ SOLN
INTRAMUSCULAR | Status: AC
  Filled 2016-05-12: qty 2

## 2016-05-12 MED ORDER — LACTATED RINGERS IV SOLN
INTRAVENOUS | Status: DC
  Administered 2016-05-12 (×3): via INTRAVENOUS

## 2016-05-12 MED ORDER — HYDROMORPHONE HCL 1 MG/ML IJ SOLN
0.2500 mg | INTRAMUSCULAR | Status: DC | PRN
  Administered 2016-05-12 (×3): 0.5 mg via INTRAVENOUS

## 2016-05-12 MED ORDER — ACETAMINOPHEN 325 MG PO TABS
650.0000 mg | ORAL_TABLET | Freq: Four times a day (QID) | ORAL | Status: DC | PRN
  Administered 2016-05-15 – 2016-05-17 (×2): 650 mg via ORAL
  Filled 2016-05-12 (×2): qty 2

## 2016-05-12 MED ORDER — LIDOCAINE HCL (CARDIAC) 20 MG/ML IV SOLN
INTRAVENOUS | Status: DC | PRN
  Administered 2016-05-12: 20 mg via INTRAVENOUS

## 2016-05-12 MED ORDER — MIDAZOLAM HCL 2 MG/2ML IJ SOLN
2.0000 mg | Freq: Once | INTRAMUSCULAR | Status: AC
  Administered 2016-05-12: 2 mg via INTRAVENOUS

## 2016-05-12 MED ORDER — PHENOL 1.4 % MT LIQD: 1.0000 | OROMUCOSAL | Status: DC | PRN

## 2016-05-12 MED ORDER — PROPOFOL 10 MG/ML IV BOLUS
INTRAVENOUS | Status: AC
  Filled 2016-05-12: qty 20

## 2016-05-12 MED ORDER — PROMETHAZINE HCL 25 MG/ML IJ SOLN: 6.2500 mg | INTRAMUSCULAR | Status: DC | PRN

## 2016-05-12 MED ORDER — EPHEDRINE 5 MG/ML INJ
INTRAVENOUS | Status: AC
  Filled 2016-05-12: qty 10

## 2016-05-12 MED ORDER — ROCURONIUM BROMIDE 100 MG/10ML IV SOLN
INTRAVENOUS | Status: DC | PRN
  Administered 2016-05-12: 50 mg via INTRAVENOUS

## 2016-05-12 MED ORDER — CHLORHEXIDINE GLUCONATE 4 % EX LIQD: 60.0000 mL | Freq: Once | CUTANEOUS | Status: DC

## 2016-05-12 MED ORDER — SODIUM CHLORIDE 0.9 % IR SOLN
Status: DC | PRN
  Administered 2016-05-12: 3000 mL

## 2016-05-12 MED ORDER — INSULIN ASPART 100 UNIT/ML ~~LOC~~ SOLN
6.0000 [IU] | Freq: Three times a day (TID) | SUBCUTANEOUS | Status: DC
  Administered 2016-05-13 – 2016-05-14 (×2): 6 [IU] via SUBCUTANEOUS
  Administered 2016-05-14: 10 [IU] via SUBCUTANEOUS
  Administered 2016-05-14 – 2016-05-20 (×8): 6 [IU] via SUBCUTANEOUS

## 2016-05-12 MED ORDER — SUGAMMADEX SODIUM 200 MG/2ML IV SOLN
INTRAVENOUS | Status: AC
  Filled 2016-05-12: qty 4

## 2016-05-12 MED ORDER — FENTANYL CITRATE (PF) 100 MCG/2ML IJ SOLN
INTRAMUSCULAR | Status: AC
  Filled 2016-05-12: qty 2

## 2016-05-12 MED ORDER — MEPERIDINE HCL 25 MG/ML IJ SOLN: 6.2500 mg | INTRAMUSCULAR | Status: DC | PRN

## 2016-05-12 MED ORDER — BUPIVACAINE LIPOSOME 1.3 % IJ SUSP
20.0000 mL | INTRAMUSCULAR | Status: DC
  Filled 2016-05-12: qty 20

## 2016-05-12 MED ORDER — ONDANSETRON HCL 4 MG/2ML IJ SOLN
4.0000 mg | Freq: Four times a day (QID) | INTRAMUSCULAR | Status: DC | PRN
  Administered 2016-05-12 – 2016-05-16 (×7): 4 mg via INTRAVENOUS
  Filled 2016-05-12 (×8): qty 2

## 2016-05-12 MED ORDER — SODIUM CHLORIDE 0.9 % IV SOLN
INTRAVENOUS | Status: AC
  Administered 2016-05-13 – 2016-05-17 (×5): via INTRAVENOUS

## 2016-05-12 MED ORDER — APIXABAN 2.5 MG PO TABS: 2.5000 mg | ORAL_TABLET | Freq: Two times a day (BID) | ORAL | 0 refills | Status: DC

## 2016-05-12 MED ORDER — HYDROMORPHONE HCL 1 MG/ML IJ SOLN
0.5000 mg | INTRAMUSCULAR | Status: DC | PRN
  Administered 2016-05-12 – 2016-05-13 (×6): 0.5 mg via INTRAVENOUS
  Filled 2016-05-12 (×6): qty 1

## 2016-05-12 MED ORDER — METOCLOPRAMIDE HCL 5 MG PO TABS: 5.0000 mg | ORAL_TABLET | Freq: Three times a day (TID) | ORAL | Status: DC | PRN

## 2016-05-12 MED ORDER — HYDROMORPHONE HCL 1 MG/ML IJ SOLN
INTRAMUSCULAR | Status: AC
  Administered 2016-05-12: 0.5 mg via INTRAVENOUS
  Filled 2016-05-12: qty 1

## 2016-05-12 MED ORDER — TRAMADOL HCL 50 MG PO TABS
100.0000 mg | ORAL_TABLET | Freq: Four times a day (QID) | ORAL | Status: DC | PRN
  Administered 2016-05-12 – 2016-05-19 (×4): 100 mg via ORAL
  Filled 2016-05-12 (×4): qty 2

## 2016-05-12 MED ORDER — SODIUM CHLORIDE 0.9 % IV SOLN
2000.0000 mg | INTRAVENOUS | Status: AC
  Administered 2016-05-12: 2000 mg via TOPICAL
  Filled 2016-05-12: qty 20

## 2016-05-12 MED ORDER — SENNOSIDES-DOCUSATE SODIUM 8.6-50 MG PO TABS
1.0000 | ORAL_TABLET | Freq: Every evening | ORAL | Status: DC | PRN
  Filled 2016-05-12: qty 1

## 2016-05-12 MED ORDER — ONDANSETRON HCL 4 MG/2ML IJ SOLN
INTRAMUSCULAR | Status: AC
  Filled 2016-05-12: qty 2

## 2016-05-12 MED ORDER — PROMETHAZINE HCL 12.5 MG PO TABS: 12.5000 mg | ORAL_TABLET | Freq: Four times a day (QID) | ORAL | 0 refills | Status: DC | PRN

## 2016-05-12 MED ORDER — BUPIVACAINE-EPINEPHRINE (PF) 0.5% -1:200000 IJ SOLN
INTRAMUSCULAR | Status: DC | PRN
  Administered 2016-05-12: 30 mL via PERINEURAL

## 2016-05-12 MED ORDER — OXYCODONE HCL 5 MG PO TABS
5.0000 mg | ORAL_TABLET | ORAL | Status: DC | PRN
  Administered 2016-05-13 (×2): 10 mg via ORAL
  Administered 2016-05-14: 5 mg via ORAL
  Administered 2016-05-14 (×2): 10 mg via ORAL
  Administered 2016-05-15 (×2): 5 mg via ORAL
  Filled 2016-05-12 (×2): qty 2
  Filled 2016-05-12: qty 1
  Filled 2016-05-12 (×4): qty 2
  Filled 2016-05-12: qty 1
  Filled 2016-05-12: qty 2

## 2016-05-12 MED ORDER — INSULIN ASPART 100 UNIT/ML ~~LOC~~ SOLN
0.0000 [IU] | Freq: Three times a day (TID) | SUBCUTANEOUS | Status: DC
  Administered 2016-05-12 – 2016-05-13 (×2): 3 [IU] via SUBCUTANEOUS
  Administered 2016-05-14 – 2016-05-15 (×2): 4 [IU] via SUBCUTANEOUS
  Administered 2016-05-16 – 2016-05-19 (×3): 3 [IU] via SUBCUTANEOUS

## 2016-05-12 MED ORDER — FENTANYL CITRATE (PF) 100 MCG/2ML IJ SOLN
100.0000 ug | Freq: Once | INTRAMUSCULAR | Status: AC
  Administered 2016-05-12: 100 ug via INTRAVENOUS

## 2016-05-12 MED ORDER — APIXABAN 2.5 MG PO TABS
2.5000 mg | ORAL_TABLET | Freq: Two times a day (BID) | ORAL | Status: DC
  Administered 2016-05-13 – 2016-05-16 (×7): 2.5 mg via ORAL
  Filled 2016-05-12 (×8): qty 1

## 2016-05-12 MED ORDER — BISACODYL 5 MG PO TBEC
5.0000 mg | DELAYED_RELEASE_TABLET | Freq: Every day | ORAL | Status: DC | PRN
  Administered 2016-05-19: 5 mg via ORAL
  Filled 2016-05-12: qty 1

## 2016-05-12 MED ORDER — FLEET ENEMA 7-19 GM/118ML RE ENEM
1.0000 | ENEMA | Freq: Once | RECTAL | Status: DC | PRN
  Filled 2016-05-12: qty 1

## 2016-05-12 MED ORDER — DOCUSATE SODIUM 100 MG PO CAPS
100.0000 mg | ORAL_CAPSULE | Freq: Every day | ORAL | Status: DC
  Administered 2016-05-13 – 2016-05-19 (×7): 100 mg via ORAL
  Filled 2016-05-12 (×8): qty 1

## 2016-05-12 MED ORDER — BUPIVACAINE-EPINEPHRINE 0.5% -1:200000 IJ SOLN
INTRAMUSCULAR | Status: DC | PRN
  Administered 2016-05-12: 50 mL

## 2016-05-12 MED ORDER — SCOPOLAMINE 1 MG/3DAYS TD PT72: 1.0000 | MEDICATED_PATCH | TRANSDERMAL | Status: DC

## 2016-05-12 MED ORDER — OXYCODONE HCL 5 MG PO TABS: ORAL_TABLET | ORAL | 0 refills | Status: DC

## 2016-05-12 MED ORDER — NITROGLYCERIN 0.4 MG SL SUBL: 0.4000 mg | SUBLINGUAL_TABLET | SUBLINGUAL | Status: DC | PRN

## 2016-05-12 MED ORDER — ONDANSETRON HCL 4 MG PO TABS
4.0000 mg | ORAL_TABLET | Freq: Four times a day (QID) | ORAL | Status: DC | PRN
  Administered 2016-05-19: 4 mg via ORAL
  Filled 2016-05-12: qty 1

## 2016-05-12 MED ORDER — SUGAMMADEX SODIUM 200 MG/2ML IV SOLN
INTRAVENOUS | Status: DC | PRN
  Administered 2016-05-12: 230 mg via INTRAVENOUS

## 2016-05-12 MED ORDER — MENTHOL 3 MG MT LOZG: 1.0000 | LOZENGE | OROMUCOSAL | Status: DC | PRN

## 2016-05-12 MED ORDER — CARVEDILOL 3.125 MG PO TABS
3.1250 mg | ORAL_TABLET | Freq: Two times a day (BID) | ORAL | Status: DC
  Administered 2016-05-13 – 2016-05-15 (×6): 3.125 mg via ORAL
  Filled 2016-05-12 (×7): qty 1

## 2016-05-12 MED ORDER — ONDANSETRON HCL 4 MG/2ML IJ SOLN
INTRAMUSCULAR | Status: DC | PRN
  Administered 2016-05-12: 4 mg via INTRAVENOUS

## 2016-05-12 MED ORDER — PROMETHAZINE HCL 25 MG PO TABS
12.5000 mg | ORAL_TABLET | Freq: Four times a day (QID) | ORAL | Status: DC | PRN
  Administered 2016-05-12 – 2016-05-15 (×3): 12.5 mg via ORAL
  Filled 2016-05-12 (×3): qty 1

## 2016-05-12 MED ORDER — SCOPOLAMINE 1 MG/3DAYS TD PT72
MEDICATED_PATCH | TRANSDERMAL | Status: AC
  Administered 2016-05-12: 1.5 mg
  Filled 2016-05-12: qty 1

## 2016-05-12 MED ORDER — METOCLOPRAMIDE HCL 5 MG/ML IJ SOLN
5.0000 mg | Freq: Three times a day (TID) | INTRAMUSCULAR | Status: DC | PRN
  Administered 2016-05-13 (×2): 10 mg via INTRAVENOUS
  Filled 2016-05-12 (×2): qty 2

## 2016-05-12 MED ORDER — ACETAMINOPHEN 650 MG RE SUPP: 650.0000 mg | Freq: Four times a day (QID) | RECTAL | Status: DC | PRN

## 2016-05-12 MED ORDER — SODIUM CHLORIDE 0.9% FLUSH
INTRAVENOUS | Status: DC | PRN
  Administered 2016-05-12: 50 mL via INTRAVENOUS

## 2016-05-12 MED ORDER — FUROSEMIDE 20 MG PO TABS
20.0000 mg | ORAL_TABLET | Freq: Every day | ORAL | Status: DC
  Administered 2016-05-12 – 2016-05-15 (×4): 20 mg via ORAL
  Filled 2016-05-12 (×6): qty 1

## 2016-05-12 MED ORDER — LIDOCAINE 2% (20 MG/ML) 5 ML SYRINGE
INTRAMUSCULAR | Status: AC
  Filled 2016-05-12: qty 5

## 2016-05-12 MED ORDER — MIDAZOLAM HCL 2 MG/2ML IJ SOLN: 0.5000 mg | Freq: Once | INTRAMUSCULAR | Status: DC | PRN

## 2016-05-12 MED ORDER — PROPOFOL 10 MG/ML IV BOLUS
INTRAVENOUS | Status: DC | PRN
  Administered 2016-05-12: 150 mg via INTRAVENOUS

## 2016-05-12 MED ORDER — FENTANYL CITRATE (PF) 100 MCG/2ML IJ SOLN
INTRAMUSCULAR | Status: DC | PRN
  Administered 2016-05-12: 100 ug via INTRAVENOUS
  Administered 2016-05-12: 50 ug via INTRAVENOUS

## 2016-05-12 MED ORDER — INSULIN ASPART 100 UNIT/ML ~~LOC~~ SOLN: 0.0000 [IU] | Freq: Every day | SUBCUTANEOUS | Status: DC

## 2016-05-12 MED ORDER — TRANEXAMIC ACID 1000 MG/10ML IV SOLN
1000.0000 mg | Freq: Once | INTRAVENOUS | Status: AC
  Administered 2016-05-12: 1000 mg via INTRAVENOUS
  Filled 2016-05-12: qty 10

## 2016-05-12 MED ORDER — LEVOTHYROXINE SODIUM 75 MCG PO TABS
150.0000 ug | ORAL_TABLET | Freq: Every day | ORAL | Status: DC
  Administered 2016-05-13 – 2016-05-20 (×8): 150 ug via ORAL
  Filled 2016-05-12 (×8): qty 2

## 2016-05-12 MED ORDER — BUPIVACAINE-EPINEPHRINE (PF) 0.5% -1:200000 IJ SOLN
INTRAMUSCULAR | Status: AC
  Filled 2016-05-12: qty 60

## 2016-05-12 MED ORDER — EPHEDRINE SULFATE 50 MG/ML IJ SOLN
INTRAMUSCULAR | Status: DC | PRN
  Administered 2016-05-12 (×2): 10 mg via INTRAVENOUS

## 2016-05-12 MED ORDER — ROCURONIUM BROMIDE 10 MG/ML (PF) SYRINGE
PREFILLED_SYRINGE | INTRAVENOUS | Status: AC
  Filled 2016-05-12: qty 10

## 2016-05-12 MED ORDER — BUPIVACAINE LIPOSOME 1.3 % IJ SUSP
INTRAMUSCULAR | Status: DC | PRN
  Administered 2016-05-12: 20 mL

## 2016-05-12 SURGICAL SUPPLY — 66 items
BANDAGE ACE 4X5 VEL STRL LF (GAUZE/BANDAGES/DRESSINGS) ×5 IMPLANT
BANDAGE ACE 6X5 VEL STRL LF (GAUZE/BANDAGES/DRESSINGS) ×5 IMPLANT
BANDAGE ESMARK 6X9 LF (GAUZE/BANDAGES/DRESSINGS) ×1 IMPLANT
BLADE SAGITTAL 25.0X1.19X90 (BLADE) ×2 IMPLANT
BLADE SAGITTAL 25.0X1.19X90MM (BLADE) ×1
BLADE SAW SAG 90X13X1.27 (BLADE) ×3 IMPLANT
BNDG CMPR 9X6 STRL LF SNTH (GAUZE/BANDAGES/DRESSINGS) ×1
BNDG ESMARK 6X9 LF (GAUZE/BANDAGES/DRESSINGS) ×3
BONE CEMENT GENTAMICIN (Cement) ×6 IMPLANT
BOWL SMART MIX CTS (DISPOSABLE) ×3 IMPLANT
CAP KNEE TOTAL 3 SIGMA ×2 IMPLANT
CEMENT BONE GENTAMICIN 40 (Cement) IMPLANT
COVER SURGICAL LIGHT HANDLE (MISCELLANEOUS) ×3 IMPLANT
CUFF TOURNIQUET SINGLE 34IN LL (TOURNIQUET CUFF) ×3 IMPLANT
CUFF TOURNIQUET SINGLE 44IN (TOURNIQUET CUFF) IMPLANT
DRAPE INCISE IOBAN 66X45 STRL (DRAPES) IMPLANT
DRAPE ORTHO SPLIT 77X108 STRL (DRAPES) ×6
DRAPE SURG ORHT 6 SPLT 77X108 (DRAPES) ×2 IMPLANT
DRAPE U-SHAPE 47X51 STRL (DRAPES) ×3 IMPLANT
DRSG ADAPTIC 3X8 NADH LF (GAUZE/BANDAGES/DRESSINGS) ×5 IMPLANT
DRSG PAD ABDOMINAL 8X10 ST (GAUZE/BANDAGES/DRESSINGS) ×6 IMPLANT
DURAPREP 26ML APPLICATOR (WOUND CARE) ×3 IMPLANT
ELECT REM PT RETURN 9FT ADLT (ELECTROSURGICAL) ×3
ELECTRODE REM PT RTRN 9FT ADLT (ELECTROSURGICAL) ×1 IMPLANT
EVACUATOR 1/8 PVC DRAIN (DRAIN) IMPLANT
FACESHIELD WRAPAROUND (MASK) ×6 IMPLANT
FACESHIELD WRAPAROUND OR TEAM (MASK) ×2 IMPLANT
FLOSEAL 10ML (HEMOSTASIS) IMPLANT
GAUZE SPONGE 4X4 12PLY STRL (GAUZE/BANDAGES/DRESSINGS) ×3 IMPLANT
GLOVE BIOGEL PI IND STRL 8 (GLOVE) ×4 IMPLANT
GLOVE BIOGEL PI INDICATOR 8 (GLOVE) ×8
GLOVE ORTHO TXT STRL SZ7.5 (GLOVE) ×3 IMPLANT
GLOVE SURG ORTHO 8.0 STRL STRW (GLOVE) ×3 IMPLANT
GOWN STRL REUS W/ TWL LRG LVL3 (GOWN DISPOSABLE) ×2 IMPLANT
GOWN STRL REUS W/ TWL XL LVL3 (GOWN DISPOSABLE) ×1 IMPLANT
GOWN STRL REUS W/TWL 2XL LVL3 (GOWN DISPOSABLE) ×3 IMPLANT
GOWN STRL REUS W/TWL LRG LVL3 (GOWN DISPOSABLE) ×6
GOWN STRL REUS W/TWL XL LVL3 (GOWN DISPOSABLE) ×3
HANDPIECE INTERPULSE COAX TIP (DISPOSABLE) ×3
HOOD PEEL AWAY FACE SHEILD DIS (HOOD) ×3 IMPLANT
IMMOBILIZER KNEE 22 UNIV (SOFTGOODS) ×2 IMPLANT
KIT BASIN OR (CUSTOM PROCEDURE TRAY) ×3 IMPLANT
KIT ROOM TURNOVER OR (KITS) ×3 IMPLANT
MANIFOLD NEPTUNE II (INSTRUMENTS) ×3 IMPLANT
NEEDLE 22X1 1/2 (OR ONLY) (NEEDLE) ×6 IMPLANT
NS IRRIG 1000ML POUR BTL (IV SOLUTION) ×3 IMPLANT
PACK TOTAL JOINT (CUSTOM PROCEDURE TRAY) ×3 IMPLANT
PAD ARMBOARD 7.5X6 YLW CONV (MISCELLANEOUS) ×6 IMPLANT
PAD CAST 4YDX4 CTTN HI CHSV (CAST SUPPLIES) ×1 IMPLANT
PADDING CAST COTTON 4X4 STRL (CAST SUPPLIES) ×6
PADDING CAST COTTON 6X4 STRL (CAST SUPPLIES) ×5 IMPLANT
SET HNDPC FAN SPRY TIP SCT (DISPOSABLE) ×1 IMPLANT
SPONGE GAUZE 4X4 12PLY STER LF (GAUZE/BANDAGES/DRESSINGS) ×4 IMPLANT
STAPLER VISISTAT 35W (STAPLE) ×3 IMPLANT
SUCTION FRAZIER HANDLE 10FR (MISCELLANEOUS) ×2
SUCTION TUBE FRAZIER 10FR DISP (MISCELLANEOUS) ×1 IMPLANT
SUT ETHIBOND NAB CT1 #1 30IN (SUTURE) ×9 IMPLANT
SUT VIC AB 0 CT1 27 (SUTURE) ×3
SUT VIC AB 0 CT1 27XBRD ANBCTR (SUTURE) ×1 IMPLANT
SUT VIC AB 2-0 CT1 27 (SUTURE) ×6
SUT VIC AB 2-0 CT1 TAPERPNT 27 (SUTURE) ×2 IMPLANT
SYR CONTROL 10ML LL (SYRINGE) ×6 IMPLANT
TOWEL OR 17X24 6PK STRL BLUE (TOWEL DISPOSABLE) ×3 IMPLANT
TOWEL OR 17X26 10 PK STRL BLUE (TOWEL DISPOSABLE) ×3 IMPLANT
TRAY FOLEY CATH 16FRSI W/METER (SET/KITS/TRAYS/PACK) IMPLANT
WATER STERILE IRR 1000ML POUR (IV SOLUTION) ×3 IMPLANT

## 2016-05-12 NOTE — Progress Notes (Signed)
Orthopedic Tech Progress Note Patient Details:  Tamara [name] [birth year] [id number]  CPM Right Knee CPM Right Knee: On Right Knee Flexion (Degrees): 90 Right Knee Extension (Degrees): 0 Additional Comments: Trapeze bar and foot roll   Saul FordyceJennifer C Abbigayle Toole 05/12/2016, 1:59 PM

## 2016-05-12 NOTE — Brief Op Note (Signed)
05/12/2016  12:36 PM  PATIENT:  Tamara [name]  75 y.o. female  PRE-OPERATIVE DIAGNOSIS:  OA RIGHT KNEE  POST-OPERATIVE DIAGNOSIS:  OA RIGHT KNEE  PROCEDURE:  Procedure(s): TOTAL KNEE ARTHROPLASTY (Right)  SURGEON:  Surgeon(s) and Role:    * Frederico Hammananiel Caffrey, MD - Primary  PHYSICIAN ASSISTANT: Margart SicklesJoshua River Ambrosio, PA-C  ASSISTANTS:   ANESTHESIA:   local, regional and general  EBL:  Total I/O In: 1000 [I.V.:1000] Out: -   BLOOD ADMINISTERED:none  DRAINS: none   LOCAL MEDICATIONS USED:  MARCAINE     SPECIMEN:  No Specimen  DISPOSITION OF SPECIMEN:  N/A  COUNTS:  YES  TOURNIQUET:   Total Tourniquet Time Documented: Thigh (Right) - 64 minutes Total: Thigh (Right) - 64 minutes   DICTATION: .Other Dictation: Dictation Number unknown  PLAN OF CARE: Admit to inpatient   PATIENT DISPOSITION:  PACU - hemodynamically stable.   Delay start of Pharmacological VTE agent (>24hrs) due to surgical blood loss or risk of bleeding: yes

## 2016-05-12 NOTE — Progress Notes (Signed)
Pt. Stated she started coming down with a cold.Pt. Notified Dr Candise Bowensaffrey's PA. Saw her primary MD and he did a cxr per pt. Stated negative. And she got a shot of antibiotics. Pt. States she feels good.

## 2016-05-12 NOTE — Care Management Note (Addendum)
Case Management Note  Patient Details  Name: Tamara [name] MRN: [id number] Date of Birth: 14-Apr-1941  Subjective/Objective:    Right Total Knee Arthroplasty                Action/Plan: Discharge Planning: Home vs SNF  Pt preoperatively arranged with [facility]. [employer] scheduled to deliver CPM to home and provided 3n1 bedside commode. Pt has RW at home. Waiting PT recommendations for dc. Weekend NCM will continue to follow for dc needs.   PCP -Alan MulderMORAYATI, SHAMIL J MD  Expected Discharge Date:                  Expected Discharge Plan:  Home w Home Health Services  In-House Referral:  NA  Discharge planning Services  CM Consult  Post Acute Care Choice:  Home Health Choice offered to:  Patient  DME Arranged:  3-N-1, CPM DME Agency:  [employer]  HH Arranged:  PT HH [facility]  Status of Service:  In process, will continue to follow  If discussed at Long Length of Stay Meetings, dates discussed:    Additional Comments:  Elliot CousinShavis, Vlasta Baskin Ellen, RN 05/12/2016, 5:33 PM

## 2016-05-12 NOTE — Anesthesia Procedure Notes (Addendum)
Procedure Name: Intubation Date/Time: 05/12/2016 10:38 AM Performed by: Darcey NoraJAMES, Casper Pagliuca B Pre-anesthesia Checklist: Patient identified, Emergency Drugs available, Suction available and Patient being monitored Patient Re-evaluated:Patient Re-evaluated prior to inductionOxygen Delivery Method: Circle system utilized Preoxygenation: Pre-oxygenation with 100% oxygen Intubation Type: IV induction Ventilation: Mask ventilation without difficulty and Oral airway inserted - appropriate to patient size Laryngoscope Size: Mac and 3 Grade View: Grade II Tube type: Oral Tube size: 7.5 mm Number of attempts: 1 Airway Equipment and Method: Stylet Placement Confirmation: ETT inserted through vocal cords under direct vision,  positive ETCO2 and breath sounds checked- equal and bilateral Secured at: 21 (cm at teeth) cm Tube secured with: Tape Dental Injury: Teeth and Oropharynx as per pre-operative assessment

## 2016-05-12 NOTE — Progress Notes (Signed)
Cane left in room.  Nurse gave cane to sister in law, [place] Springs[place]eth.

## 2016-05-12 NOTE — Anesthesia Postprocedure Evaluation (Signed)
Anesthesia Post Note  Patient: Tamara [name]  Procedure(s) Performed: Procedure(s) (LRB): TOTAL KNEE ARTHROPLASTY (Right)  Patient location during evaluation: PACU Anesthesia Type: General and Regional Level of consciousness: sedated, oriented and patient cooperative Pain management: pain level controlled Vital Signs Assessment: post-procedure vital signs reviewed and stable Respiratory status: spontaneous breathing, nonlabored ventilation, respiratory function stable and patient connected to nasal cannula oxygen Cardiovascular status: blood pressure returned to baseline and stable Postop Assessment: no signs of nausea or vomiting Anesthetic complications: no    Last Vitals:  Vitals:   05/12/16 1405 05/12/16 1452  BP:  (!) 98/42  Pulse: 72 67  Resp: 14 15  Temp:  36.4 C    Last Pain:  Vitals:   05/12/16 1452  TempSrc: Oral  PainSc:                  Byard Carranza,E. Graeme Menees

## 2016-05-12 NOTE — Progress Notes (Signed)
Orthopedic Tech Progress Note Patient Details:  Tamara Citrindna H Gavidia May 15, 1941 [id number]  Patient ID: Tamara [name], female   DOB: May 15, 1941, 75 y.o.   MRN: [id number] Applied cpm 0-60  [name], [name] 05/12/2016, 8:05 PM

## 2016-05-12 NOTE — Transfer of Care (Signed)
Immediate Anesthesia Transfer of Care Note  Patient: Tamara [name]  Procedure(s) Performed: Procedure(s): TOTAL KNEE ARTHROPLASTY (Right)  Patient Location: PACU  Anesthesia Type:GA combined with regional for post-op pain  Level of Consciousness: awake, alert  and patient cooperative  Airway & Oxygen Therapy: Patient Spontanous Breathing and Patient connected to nasal cannula oxygen  Post-op Assessment: Report given to RN and Post -op Vital signs reviewed and stable  Post vital signs: Reviewed and stable  Last Vitals:  Vitals:   05/12/16 1014 05/12/16 1019  BP: (!) 101/54   Pulse: 72 66  Resp: 12 15  Temp:      Last Pain:  Vitals:   05/12/16 0942  TempSrc:   PainSc: 2       Patients Stated Pain Goal: 2 (05/12/16 0942)  Complications: No apparent anesthesia complications

## 2016-05-12 NOTE — Anesthesia Procedure Notes (Signed)
Anesthesia Regional Block:  Adductor canal block  Pre-Anesthetic Checklist: ,, timeout performed, Correct Patient, Correct Site, Correct Laterality, Correct Procedure, Correct Position, site marked, Risks and benefits discussed,  Surgical consent,  Pre-op evaluation,  At surgeon's request and post-op pain management  Laterality: Right and Lower  Prep: chloraprep       Needles:  Injection technique: Single-shot  Needle Type: Echogenic Needle     Needle Length: 9cm 9 cm Needle Gauge: 22 and 22 G    Additional Needles:  Procedures: ultrasound guided (picture in chart) Adductor canal block Narrative:  Start time: 05/12/2016 10:07 AM End time: 05/12/2016 10:14 AM Injection made incrementally with aspirations every 5 mL.  Performed by: Personally  Anesthesiologist: Jean RosenthalJACKSON, Rheba Diamond  Additional Notes: Pt identified in Holding room.  Monitors applied. Working IV access confirmed. Sterile prep, drape R thigh.  #22ga into adductor canal with US guidance.  30cc 0.5% Bupivacaine with 1:200k epi injected incrementally after negative test dose, good spread in canal.  Patient asymptomatic, VSS, no heme aspirated, tolerated well.  Sandford Craze Takoya Jonas, MD

## 2016-05-12 NOTE — Anesthesia Preprocedure Evaluation (Addendum)
Anesthesia Evaluation  Patient identified by MRN, date of birth, ID band Patient awake    Reviewed: Allergy & Precautions, NPO status , Patient's Chart, lab work & pertinent test results, reviewed documented beta blocker date and time   History of Anesthesia Complications (+) PONV and history of anesthetic complications  Airway Mallampati: II  TM Distance: >3 FB Neck ROM: Full    Dental  (+) Missing, Dental Advisory Given, Teeth Intact   Pulmonary asthma , COPD,  COPD inhaler,    breath sounds clear to auscultation       Cardiovascular hypertension, Pt. on medications and Pt. on home beta blockers + dysrhythmias Supra Ventricular Tachycardia  Rhythm:Regular Rate:Normal  7/17 Stress: Normal study with no ischemia or infarction; EF 62 with normal wall motion.   Neuro/Psych H/o brachial plexus block    GI/Hepatic negative GI ROS, Neg liver ROS,   Endo/Other  diabetes (glu 92), Well Controlled, Type 2, Oral Hypoglycemic AgentsHypothyroidism Morbid obesity  Renal/GU Renal InsufficiencyRenal disease (creat 1.23)     Musculoskeletal  (+) Arthritis , Osteoarthritis,    Abdominal (+) + obese,   Peds  Hematology   Anesthesia Other Findings   Reproductive/Obstetrics                           Anesthesia Physical Anesthesia Plan  ASA: III  Anesthesia Plan: General   Post-op Pain Management: GA combined w/ Regional for post-op pain   Induction: Intravenous  Airway Management Planned: Oral ETT  Additional Equipment:   Intra-op Plan:   Post-operative Plan: Extubation in OR  Informed Consent: I have reviewed the patients History and Physical, chart, labs and discussed the procedure including the risks, benefits and alternatives for the proposed anesthesia with the patient or authorized representative who has indicated his/her understanding and acceptance.   Dental advisory given  Plan Discussed  with: CRNA and Surgeon  Anesthesia Plan Comments: (Plan routine monitors, GETA with adductor canal block for post op analgesia)        Anesthesia Quick Evaluation

## 2016-05-12 NOTE — H&P (View-Only) (Signed)
TOTAL KNEE ADMISSION H&P  Patient is being admitted for right total knee arthroplasty.  Subjective:  Chief Complaint:right knee pain.  HPI: Tamara [name], 75 y.o. female, has a history of pain and functional disability in the right knee due to arthritis and has failed non-surgical conservative treatments for greater than 12 weeks to includeNSAID's and/or analgesics, corticosteriod injections, viscosupplementation injections, use of assistive devices and activity modification.  Onset of symptoms was gradual, starting >10 years ago with gradually worsening course since that time. The patient noted no past surgery on the right knee(s).  Patient currently rates pain in the right knee(s) at 10 out of 10 with activity. Patient has night pain, worsening of pain with activity and weight bearing, pain that interferes with activities of daily living, pain with passive range of motion, crepitus and joint swelling.  Patient has evidence of periarticular osteophytes and joint space narrowing by imaging studies. There is no active infection.  Patient Active Problem List   Diagnosis Date Noted  . Paroxysmal SVT (supraventricular tachycardia) (HCC) 03/01/2016  . Preprocedural cardiovascular examination 03/01/2016  . Pain in the chest 03/01/2016   Past Medical History:  Diagnosis Date  . Anemia    distant hx of  . Arthritis    KNEES,HANDS,WRIST  . Arthritis    KNEE PAIN, BONE ON BONE  . Asthma    allergy related  . 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 Bronchitis   . Cardiomegaly   . CHF (congestive heart failure) (HCC)    stopped using diuretics due to kidney issues, watching salt intake  . Chronic cough   . Complication of anesthesia    BP bottomed out during Achilles Tendon repair >3956yrs ago  . Diabetes mellitus without complication (HCC)    type 2  . Diverticulitis   . Gout   . Hepatitis    childhood Hep A 75 yrs old  . Hypertension   . Hypothyroidism   . 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 Obesity   . Palpitations   . Pneumonia    hx of  . Shortness of breath dyspnea   . SVT (supraventricular tachycardia) (HCC)   . Wheezing     Past Surgical History:  Procedure Laterality Date  . ABDOMINAL HYSTERECTOMY    . ABDOMINAL HYSTERECTOMY  1984  . ACHILLES TENDON REPAIR    . CARDIAC CATHETERIZATION     [place]  . CATARACT EXTRACTION W/PHACO Left 02/10/2015   Procedure: CATARACT EXTRACTION PHACO AND INTRAOCULAR LENS PLACEMENT (IOC);  Surgeon: Lockie Molahadwick Brasington, MD;  [facility];  Service: Ophthalmology;  Laterality: Left;  DIABETIC  . CATARACT EXTRACTION W/PHACO Right 02/24/2015   Procedure: CATARACT EXTRACTION PHACO AND INTRAOCULAR LENS PLACEMENT (IOC);  Surgeon: Lockie Molahadwick Brasington, MD;  [facility];  Service: Ophthalmology;  Laterality: Right;  . CESAREAN SECTION  1978  . CHOLECYSTECTOMY  1986     (Not in a hospital admission) Allergies  Allergen Reactions  . Codeine Nausea And Vomiting    Social History  Substance Use Topics  . Smoking status: Never Smoker  . Smokeless tobacco: Not on file  . Alcohol use No    Family History  Problem Relation Age of Onset  . Lung cancer Mother   . Heart attack Mother   . Heart Problems Father   . Hypertension Father   . Other Father     respiratory problems  . Ovarian cancer  Maternal Grandmother   . Cancer Maternal Grandfather   . Stroke Paternal Grandmother   . Diabetes Brother   . Liver cancer Brother   . COPD Sister   . Other Sister     sepsis  . Heart attack Son   . Heart attack Son   . Rheum arthritis Daughter   . Rheum arthritis Daughter   . Thyroid cancer Daughter   . Hepatitis C Daughter      Review of Systems  Respiratory: Positive for cough and shortness of breath.   Cardiovascular: Positive for chest pain, palpitations and leg swelling.  Gastrointestinal: Positive for blood in stool, constipation, diarrhea, nausea and  vomiting.  Genitourinary: Positive for dysuria, frequency and hematuria.  Musculoskeletal: Positive for joint pain.  Neurological: Positive for dizziness, loss of consciousness and headaches.  Endo/Heme/Allergies: Bruises/bleeds easily.  All other systems reviewed and are negative.   Objective:  Physical Exam  Constitutional: She is oriented to person, place, and time. She appears well-developed and well-nourished. No distress.  HENT:  Head: Normocephalic and atraumatic.  Nose: Nose normal.  Eyes: Conjunctivae and EOM are normal. Pupils are equal, round, and reactive to light.  Neck: Normal range of motion. Neck supple.  Cardiovascular: Normal rate, regular rhythm, normal heart sounds and intact distal pulses.   Respiratory: Effort normal and breath sounds normal. No respiratory distress. She has no wheezes.  GI: Soft. Bowel sounds are normal. She exhibits no distension. There is no tenderness.  Musculoskeletal:       Right knee: She exhibits swelling. She exhibits normal range of motion. Tenderness found.  Lymphadenopathy:    She has no cervical adenopathy.  Neurological: She is alert and oriented to person, place, and time. No cranial nerve deficit.  Skin: Skin is warm and dry. No rash noted. No erythema.  Psychiatric: She has a normal mood and affect. Her behavior is normal.    Vital signs in last 24 hours: @VSRANGES @  Labs:   Estimated body mass index is 46.46 kg/m as calculated from the following:   Height as of 03/16/16: 5\' 2"  (1.575 m).   Weight as of 03/16/16: 115.2 kg (254 lb).   Imaging Review Plain radiographs demonstrate severe degenerative joint disease of the right knee(s). The overall alignment issignificant varus. The bone quality appears to be good for age and reported activity level.  Assessment/Plan:  End stage arthritis, right knee   The patient history, physical examination, clinical judgment of the provider and imaging studies are consistent with  end stage degenerative joint disease of the right knee(s) and total knee arthroplasty is deemed medically necessary. The treatment options including medical management, injection therapy arthroscopy and arthroplasty were discussed at length. The risks and benefits of total knee arthroplasty were presented and reviewed. The risks due to aseptic loosening, infection, stiffness, patella tracking problems, thromboembolic complications and other imponderables were discussed. The patient acknowledged the explanation, agreed to proceed with the plan and consent was signed. Patient is being admitted for inpatient treatment for surgery, pain control, PT, OT, prophylactic antibiotics, VTE prophylaxis, progressive ambulation and ADL's and discharge planning. The patient is planning to be discharged home with home health services

## 2016-05-12 NOTE — Progress Notes (Signed)
Patient refused CPAP QHS and states she has on at home and doesn't wear. Explained to her if she changed her mind to just let us know and we would come and set her up.

## 2016-05-12 NOTE — Interval H&P Note (Signed)
History and Physical Interval Note:  05/12/2016 9:31 AM  Tamara [name]  has presented today for surgery, with the diagnosis of OA RIGHT KNEE  The various methods of treatment have been discussed with the patient and family. After consideration of risks, benefits and other options for treatment, the patient has consented to  Procedure(s): TOTAL KNEE ARTHROPLASTY (Right) as a surgical intervention .  The patient's history has been reviewed, patient examined, no change in status, stable for surgery.  I have reviewed the patient's chart and labs.  Questions were answered to the patient's satisfaction.     Philopater Mucha JR,W D

## 2016-05-12 NOTE — Progress Notes (Signed)
Lunch relief by MA Tikita Mabee RN 

## 2016-05-13 LAB — BASIC METABOLIC PANEL
ANION GAP: 11 (ref 5–15)
BUN: 17 mg/dL (ref 6–20)
CALCIUM: 8.9 mg/dL (ref 8.9–10.3)
CHLORIDE: 103 mmol/L (ref 101–111)
CO2: 23 mmol/L (ref 22–32)
Creatinine, Ser: 1.13 mg/dL — ABNORMAL HIGH (ref 0.44–1.00)
GFR calc non Af Amer: 46 mL/min — ABNORMAL LOW (ref >60)
GFR, EST AFRICAN AMERICAN: 54 mL/min — AB (ref >60)
Glucose, Bld: 130 mg/dL — ABNORMAL HIGH (ref 65–99)
Potassium: 4.8 mmol/L (ref 3.5–5.1)
Sodium: 137 mmol/L (ref 135–145)

## 2016-05-13 LAB — CBC
HEMATOCRIT: 37.3 % (ref 36.0–46.0)
HEMOGLOBIN: 11.1 g/dL — AB (ref 12.0–15.0)
MCH: 27 pg (ref 26.0–34.0)
MCHC: 29.8 g/dL — ABNORMAL LOW (ref 30.0–36.0)
MCV: 90.8 fL (ref 78.0–100.0)
Platelets: 238 10*3/uL (ref 150–400)
RBC: 4.11 MIL/uL (ref 3.87–5.11)
RDW: 15.5 % (ref 11.5–15.5)
WBC: 11 10*3/uL — AB (ref 4.0–10.5)

## 2016-05-13 LAB — GLUCOSE, CAPILLARY
GLUCOSE-CAPILLARY: 144 mg/dL — AB (ref 65–99)
GLUCOSE-CAPILLARY: 87 mg/dL (ref 65–99)
Glucose-Capillary: 104 mg/dL — ABNORMAL HIGH (ref 65–99)

## 2016-05-13 NOTE — Progress Notes (Signed)
Physical Therapy Evaluation Patient Details Name: Tamara [name] MRN: [id number] DOB: 1941/01/23 Today's Date: 05/13/2016   History of Present Illness  75 yo female with Right knee OA, s/p TKA on 03/11/16.  Clinical Impression  Patient seen for 2nd attempt at evaluation today.  Still sleepy, nausea reported to be some better, agreed to PT evaluation.  MOD/MAX assist overall, limited by pain and decreased ROM R knee.  Patient did tolerate standing at edge of bed including open chain R LE exercises, but unable to step or pivot transfer on evaluation.  Patient woke up and responded better as activity continued, but fatigued rapidly.  Patient will benefit from skilled PT services.    Follow Up Recommendations SNF;Supervision/Assistance - 24 hour    Equipment Recommendations  None recommended by PT    Recommendations for Other Services       Precautions / Restrictions Precautions Precautions: Fall;Knee Precaution Booklet Issued: Yes (comment) Precaution Comments: Provided exercise handout Required Braces or Orthoses: Knee Immobilizer - Right Knee Immobilizer - Right: On when out of bed or walking Restrictions Weight Bearing Restrictions: Yes RLE Weight Bearing: Weight bearing as tolerated      Mobility  Bed Mobility Overal bed mobility: Needs Assistance Bed Mobility: Rolling;Supine to Sit;Sit to Supine Rolling: Min assist   Supine to sit: Mod assist Sit to supine: Max assist   General bed mobility comments: Used bed trapeze and trendelenberg to get patient back into bed  Transfers Overall transfer level: Needs assistance Equipment used: Rolling walker (2 wheeled) Transfers: Sit to/from Stand Sit to Stand: Max assist         General transfer comment: Slow and antalgic, cues for hand placement.   Ambulation/Gait             General Gait Details: Unable on evaluation, pain  Stairs            Wheelchair Mobility    Modified Rankin (Stroke Patients Only)       Balance Overall balance assessment: Needs assistance Sitting-balance support: Bilateral upper extremity supported Sitting balance-Leahy Scale: Poor     Standing balance support: Bilateral upper extremity supported Standing balance-Leahy Scale: Poor                               Pertinent Vitals/Pain Pain Assessment: 0-10 Pain Score: 6  Pain Location: Rt knee Pain Descriptors / Indicators: Aching;Sore;Sharp Pain Intervention(s): Monitored during session;Limited activity within patient's tolerance    Home Living Family/patient expects to be discharged to:: Skilled nursing facility Living Arrangements: Children               Additional Comments: Plan to go to [facility] prior to return home.    Prior Function Level of Independence: Independent with assistive device(s)         Comments: Was using cane prior to admission     Hand Dominance        Extremity/Trunk Assessment   Upper Extremity Assessment: Defer to OT evaluation;Overall WFL for tasks assessed           Lower Extremity Assessment: RLE deficits/detail;Generalized weakness RLE Deficits / Details: New knee replacement, limited ROM and strength, plus pain       Communication   Communication: No difficulties  Cognition Arousal/Alertness: Lethargic;Suspect due to medications Behavior During Therapy: [facility]FL for tasks assessed/performed;Flat affect Overall Cognitive Status: Within Functional Limits for tasks assessed  General Comments General comments (skin integrity, edema, etc.): Knee immobilizer with standing    Exercises Total Joint Exercises Heel Slides: AAROM;Both;10 reps;Supine Hip ABduction/ADduction: AAROM;Both;10 reps;Supine Straight Leg Raises: AAROM;Both;10 reps;Supine Goniometric ROM:  (Visually 15-60 degrees)   Assessment/Plan    PT Assessment Patient needs continued PT services  PT Problem List Decreased strength;Decreased range of  motion;Decreased activity tolerance;Decreased balance;Decreased mobility;Obesity;Pain          PT Treatment Interventions DME instruction;Gait training;Functional mobility training;Therapeutic activities;Therapeutic exercise;Balance training;Patient/family education;Manual techniques    PT Goals (Current goals can be found in the Care Plan section)  Acute Rehab PT Goals Patient Stated Goal: Go to rehab before home PT Goal Formulation: With patient Time For Goal Achievement: 05/27/16 Potential to Achieve Goals: Good    Frequency 7X/week   Barriers to discharge        Co-evaluation               End of Session Equipment Utilized During Treatment: Gait belt;Right knee immobilizer;Oxygen Activity Tolerance: Patient limited by fatigue;Patient limited by pain;Patient limited by lethargy Patient left: in bed;with call bell/phone within reach;with bed alarm set;with family/visitor present Nurse Communication: Mobility status         Time: 8119-1478 PT Time Calculation (min) (ACUTE ONLY): 40 min   Charges:   PT Evaluation $PT Eval Low Complexity: 1 Procedure PT Treatments $Therapeutic Exercise: 8-22 mins $Therapeutic Activity: 8-22 mins   PT G CodesFreida Busman, Sorayah Schrodt L 05/30/16, 4:43 PM

## 2016-05-13 NOTE — Progress Notes (Signed)
Orthopedic Tech Progress Note Patient Details:  Tamara [name] Jul 20, 1941 [id number]  Patient ID: Tamara CitrinEdna H [name], female   DOB: Jul 20, 1941, 75 y.o.   MRN: [id number] Applied cpm 0-60  Tamara [name], Tamara [name] 05/13/2016, 5:39 AM

## 2016-05-13 NOTE — Progress Notes (Signed)
PT Cancellation Note  Patient Details Name: Tamara [name] MRN: [id number] DOB: 06-01-1941   Cancelled Treatment:    Reason Eval/Treat Not Completed: Fatigue/lethargy limiting ability to participate  Will Re-attempt later today.   Freida BusmanAllen, Raeshaun Simson L 05/13/2016, 10:51 AM

## 2016-05-13 NOTE — Op Note (Signed)
NAMBaxter Flattery:  Froning, Nancee                ACCOUNT NO.:  [id number]  MEDICAL RECORD NO.:  [id number]  LOCATION:  [id number]                        FACILITY:  [facility]  PHYSICIAN:  Dyke BrackettW. D. Ladene Allocca, M.D.    DATE OF BIRTH:  [birth year]  DATE OF PROCEDURE:  05/12/2016 DATE OF DISCHARGE:                              OPERATIVE REPORT   PREOPERATIVE DIAGNOSIS:  Severe osteoarthritis, right knee with varus deformity flexion contracture.  POSTOPERATIVE DIAGNOSIS:  Severe osteoarthritis, right knee with varus deformity flexion contracture.  OPERATION:  Right total knee replacement (Sigma size 3 femur, tibia, 35 mm all poly patella, 10 mm bearing.  SURGEON:  Dyke BrackettW. D. Abdias Hickam, MD.  ASSISTANT:  Margart SicklesJoshua Chadwell, PA-C.  ANESTHESIA:  General femoral nerve block.  TOURNIQUET TIME:  60 minutes.  DESCRIPTION OF PROCEDURE:  Exsanguination of leg, inflation of tourniquet to 350.  Straight skin incision with medial parapatellar approach to the knee made.  Some extension of the normal approach with stripping of the medial side of the knee due to varus deformity.  We cut eventually 12 mm from the distal femur.  She had a relatively severe flexion contracture, followed by cutting the tibia about 3-4 mm below the most diseased medial compartment.  Previsional tibial cut was carried out as well due to the flexion contracture with extension gap being measured at 10 mm.  The femur was sized to be a size 3 followed by placing 1 cutting block in the appropriate degree of external rotation, followed by cutting the anterior-posterior and chamfer cuts.  PCL was released.  Small remnants of menisci were removed as well as posterior osteophytes particularly off both medial and femoral condyle.  Flexion gap equaled the extension gap at 10 mm.  Kevin FentonKeyhole was cut for a size 3 tibia.  We then placed the trial tibial base plate.  Box cut was cut for the femur as well, placed by the femoral trial.  Full extension was noted.  Good  resolution of varus deformity.  Good balance in ligaments. No tendency for bearing spin out.  Patella was cut leaving about 15 mm of native patella for 35 mm all poly patella trial.  All trials were deemed to be acceptable relative to range of motion and stability.  We then inserted the final components of tibia followed by femur and patella.  We elected to use a trial bearing with these components.  We used antibiotic impregnated cement due the patient's history of diabetes.  A mixture of Exparel and Marcaine was infiltrated into the knee capsule and subcutaneous tissues.  Tourniquet was released.  Small bleeders were coagulated.  Small bits of cement were removed as well.  Closure was affected with #1 Ti-Cron, 0 and 2-0 Vicryl, and skin clips on the skin. Taken to recovery room in stable condition.     Dyke BrackettW. D. Mialani Reicks, M.D.     WDC/MEDQ  D:  05/12/2016  T:  05/13/2016  Job:  [id number]

## 2016-05-13 NOTE — Progress Notes (Signed)
SPORTS MEDICINE AND JOINT REPLACEMENT  Tamara Spurling, MD    Laurier Nancy, PA-C [address] [place], [place], [place]  [address]                             [phone]   PROGRESS NOTE  Subjective:  negative for Chest Pain  negative for Shortness of Breath  negative for Nausea/Vomiting   negative for Calf Pain  negative for Bowel Movement   Tolerating Diet: yes         Patient reports pain as 5 on 0-10 scale.    Objective: Vital signs in last 24 hours:   Patient Vitals for the past 24 hrs:  BP Temp Temp src Pulse Resp SpO2  05/13/16 0405 (!) 102/57 97.6 F (36.4 C) Oral 88 20 92 %  05/12/16 2016 (!) 105/51 98.7 F (37.1 C) Oral 73 18 94 %  05/12/16 1731 (!) 112/52 - - 70 - -  05/12/16 1452 (!) 98/42 97.6 F (36.4 C) Oral 67 15 98 %  05/12/16 1405 - - - 72 14 95 %  05/12/16 1400 - - - 71 11 94 %  05/12/16 1355 - 97.7 F (36.5 C) - - - -  05/12/16 1345 - - - 72 14 92 %  05/12/16 1310 (!) 125/54 - - 73 15 91 %  05/12/16 1255 - 98.7 F (37.1 C) - - - -  05/12/16 1019 - - - 66 15 94 %  05/12/16 1014 (!) 101/54 - - 72 12 96 %  05/12/16 1012 - - - 62 14 96 %  05/12/16 1009 120/67 - - 65 14 100 %  05/12/16 1007 - - - 66 18 100 %  05/12/16 1000 (!) 128/47 - - 63 14 100 %    @flow {1959:LAST@   Intake/Output from previous day:   09/15 0701 - 09/16 0700 In: 2672.1 [I.V.:2412.1] Out: 875 [Urine:800]   Intake/Output this shift:   No intake/output data recorded.   Intake/Output      09/15 0701 - 09/16 0700 09/16 0701 - 09/17 0700   I.V. (mL/kg) 2412.1 (20.8)    IV Piggyback 260    Total Intake(mL/kg) 2672.1 (23.1)    Urine (mL/kg/hr) 800    Blood 75    Total Output 875     Net +1797.1          Urine Occurrence 2 x       LABORATORY DATA:  Recent Labs  05/13/16 0340  WBC 11.0*  HGB 11.1*  HCT 37.3  PLT 238    Recent Labs  05/13/16 0340  NA 137  K 4.8  CL 103  CO2 23  BUN 17  CREATININE 1.13*  GLUCOSE 130*  CALCIUM 8.9   Lab Results   Component Value Date   INR 1.14 05/02/2016    Examination:  General appearance: alert, cooperative and no distress  Wound Exam: clean, dry, intact   Drainage:  None: wound tissue dry  Motor Exam: Posterior Tibial, Quadriceps and Hamstrings Intact  Sensory Exam: Superficial Peroneal, Deep Peroneal and Tibial normal   Assessment:    1 Day Post-Op  Procedure(s) (LRB): TOTAL KNEE ARTHROPLASTY (Right)  ADDITIONAL DIAGNOSIS:  Active Problems:   Primary localized osteoarthritis of right knee  Acute Blood Loss Anemia   Plan: Physical Therapy as ordered Weight Bearing as Tolerated (WBAT)  DVT Prophylaxis:  Eliquis  DISCHARGE PLAN: Skilled Nursing Facility/Rehab  DISCHARGE NEEDS: HHPT  Patient and family decided D/C to SNF was most appropriate given situation. Will order consult to social work and will keep patient until bed is available. Patient is otherwise doing well.          Tamara [name] 05/13/2016, 9:13 AM

## 2016-05-13 NOTE — Procedures (Signed)
Pt does not wish to wear cpap tonight will inform RT if anything changes. 

## 2016-05-13 NOTE — Progress Notes (Signed)
Orthopedic Tech Progress Note Patient Details:  Tamara [name] 1941-02-04 [id number]  Patient ID: Tamara [name], female   DOB: 1941-02-04, 75 y.o.   MRN: [id number]   Tamara [name], Tamara [name] 05/13/2016, 1:55 PM Placed pt's rle on cpm @0 -60 degrees @1455 

## 2016-05-14 LAB — CBC
HCT: 36.6 % (ref 36.0–46.0)
Hemoglobin: 11.2 g/dL — ABNORMAL LOW (ref 12.0–15.0)
MCH: 27.4 pg (ref 26.0–34.0)
MCHC: 30.6 g/dL (ref 30.0–36.0)
MCV: 89.5 fL (ref 78.0–100.0)
PLATELETS: 233 10*3/uL (ref 150–400)
RBC: 4.09 MIL/uL (ref 3.87–5.11)
RDW: 15.3 % (ref 11.5–15.5)
WBC: 15.9 10*3/uL — AB (ref 4.0–10.5)

## 2016-05-14 LAB — GLUCOSE, CAPILLARY
GLUCOSE-CAPILLARY: 116 mg/dL — AB (ref 65–99)
GLUCOSE-CAPILLARY: 165 mg/dL — AB (ref 65–99)
Glucose-Capillary: 115 mg/dL — ABNORMAL HIGH (ref 65–99)

## 2016-05-14 NOTE — Discharge Instructions (Signed)
Information on my medicine - ELIQUIS (apixaban)  This medication education was reviewed with me or my healthcare representative as part of my discharge preparation.   Why was Eliquis prescribed for you? Eliquis was prescribed for you to reduce the risk of blood clots forming after orthopedic surgery.    What do You need to know about Eliquis? Take your Eliquis TWICE DAILY - one tablet in the morning and one tablet in the evening with or without food.  It would be best to take the dose about the same time each day.  If you have difficulty swallowing the tablet whole please discuss with your pharmacist how to take the medication safely.  Take Eliquis exactly as prescribed by your doctor and DO NOT stop taking Eliquis without talking to the doctor who prescribed the medication.  Stopping without other medication to take the place of Eliquis may increase your risk of developing a clot.  After discharge, you should have regular check-up appointments with your healthcare provider that is prescribing your Eliquis.  What do you do if you miss a dose? If a dose of ELIQUIS is not taken at the scheduled time, take it as soon as possible on the same day and twice-daily administration should be resumed.  The dose should not be doubled to make up for a missed dose.  Do not take more than one tablet of ELIQUIS at the same time.  Important Safety Information A possible side effect of Eliquis is bleeding. You should call your healthcare provider right away if you experience any of the following: ? Bleeding from an injury or your nose that does not stop. ? Unusual colored urine (red or dark brown) or unusual colored stools (red or black). ? Unusual bruising for unknown reasons. ? A serious fall or if you hit your head (even if there is no bleeding).  Some medicines may interact with Eliquis and might increase your risk of bleeding or clotting while on Eliquis. To help avoid this, consult your  healthcare provider or pharmacist prior to using any new prescription or non-prescription medications, including herbals, vitamins, non-steroidal anti-inflammatory drugs (NSAIDs) and supplements.  This website has more information on Eliquis (apixaban): http://www.eliquis.com/eliquis/home   INSTRUCTIONS AFTER JOINT REPLACEMENT   o Remove items at home which could result in a fall. This includes throw rugs or furniture in walking pathways o ICE to the affected joint every three hours while awake for 30 minutes at a time, for at least the first 3-5 days, and then as needed for pain and swelling.  Continue to use ice for pain and swelling. You may notice swelling that will progress down to the foot and ankle.  This is normal after surgery.  Elevate your leg when you are not up walking on it.   o Continue to use the breathing machine you got in the hospital (incentive spirometer) which will help keep your temperature down.  It is common for your temperature to cycle up and down following surgery, especially at night when you are not up moving around and exerting yourself.  The breathing machine keeps your lungs expanded and your temperature down.   DIET:  As you were doing prior to hospitalization, we recommend a well-balanced diet.  DRESSING / WOUND CARE / SHOWERING  You may change your dressing 3-5 days after surgery.  Then change the dressing every day with sterile gauze.  Please use good hand washing techniques before changing the dressing.  Do not use any lotions or  creams on the incision until instructed by your surgeon.  ACTIVITY  o Increase activity slowly as tolerated, but follow the weight bearing instructions below.   o No driving for 6 weeks or until further direction given by your physician.  You cannot drive while taking narcotics.  o No lifting or carrying greater than 10 lbs. until further directed by your surgeon. o Avoid periods of inactivity such as sitting longer than an hour  when not asleep. This helps prevent blood clots.  o You may return to work once you are authorized by your doctor.     WEIGHT BEARING   Weight bearing as tolerated with assist device (walker, cane, etc) as directed, use it as long as suggested by your surgeon or therapist, typically at least 4-6 weeks.   EXERCISES  Results after joint replacement surgery are often greatly improved when you follow the exercise, range of motion and muscle strengthening exercises prescribed by your doctor. Safety measures are also important to protect the joint from further injury. Any time any of these exercises cause you to have increased pain or swelling, decrease what you are doing until you are comfortable again and then slowly increase them. If you have problems or questions, call your caregiver or physical therapist for advice.   Rehabilitation is important following a joint replacement. After just a few days of immobilization, the muscles of the leg can become weakened and shrink (atrophy).  These exercises are designed to build up the tone and strength of the thigh and leg muscles and to improve motion. Often times heat used for twenty to thirty minutes before working out will loosen up your tissues and help with improving the range of motion but do not use heat for the first two weeks following surgery (sometimes heat can increase post-operative swelling).   These exercises can be done on a training (exercise) mat, on the floor, on a table or on a bed. Use whatever works the best and is most comfortable for you.    Use music or television while you are exercising so that the exercises are a pleasant break in your day. This will make your life better with the exercises acting as a break in your routine that you can look forward to.   Perform all exercises about fifteen times, three times per day or as directed.  You should exercise both the operative leg and the other leg as well.  Exercises include:     Quad Sets - Tighten up the muscle on the front of the thigh (Quad) and hold for 5-10 seconds.    Straight Leg Raises - With your knee straight (if you were given a brace, keep it on), lift the leg to 60 degrees, hold for 3 seconds, and slowly lower the leg.  Perform this exercise against resistance later as your leg gets stronger.   Leg Slides: Lying on your back, slowly slide your foot toward your buttocks, bending your knee up off the floor (only go as far as is comfortable). Then slowly slide your foot back down until your leg is flat on the floor again.   Angel Wings: Lying on your back spread your legs to the side as far apart as you can without causing discomfort.   Hamstring Strength:  Lying on your back, push your heel against the floor with your leg straight by tightening up the muscles of your buttocks.  Repeat, but this time bend your knee to a comfortable angle, and push your  heel against the floor.  You may put a pillow under the heel to make it more comfortable if necessary.   A rehabilitation program following joint replacement surgery can speed recovery and prevent re-injury in the future due to weakened muscles. Contact your doctor or a physical therapist for more information on knee rehabilitation.    CONSTIPATION  Constipation is defined medically as fewer than three stools per week and severe constipation as less than one stool per week.  Even if you have a regular bowel pattern at home, your normal regimen is likely to be disrupted due to multiple reasons following surgery.  Combination of anesthesia, postoperative narcotics, change in appetite and fluid intake all can affect your bowels.   YOU MUST use at least one of the following options; they are listed in order of increasing strength to get the job done.  They are all available over the counter, and you may need to use some, POSSIBLY even all of these options:    Drink plenty of fluids (prune juice may be helpful) and  high fiber foods Colace 100 mg by mouth twice a day  Senokot for constipation as directed and as needed Dulcolax (bisacodyl), take with full glass of water  Miralax (polyethylene glycol) once or twice a day as needed.  If you have tried all these things and are unable to have a bowel movement in the first 3-4 days after surgery call either your surgeon or your primary doctor.    If you experience loose stools or diarrhea, hold the medications until you stool forms back up.  If your symptoms do not get better within 1 week or if they get worse, check with your doctor.  If you experience "the worst abdominal pain ever" or develop nausea or vomiting, please contact the office immediately for further recommendations for treatment.   ITCHING:  If you experience itching with your medications, try taking only a single pain pill, or even half a pain pill at a time.  You can also use Benadryl over the counter for itching or also to help with sleep.   TED HOSE STOCKINGS:  Use stockings on both legs until for at least 2 weeks or as directed by physician office. They may be removed at night for sleeping.  MEDICATIONS:  See your medication summary on the After Visit Summary that nursing will review with you.  You may have some home medications which will be placed on hold until you complete the course of blood thinner medication.  It is important for you to complete the blood thinner medication as prescribed.  PRECAUTIONS:  If you experience chest pain or shortness of breath - call 911 immediately for transfer to the hospital emergency department.   If you develop a fever greater that 101 F, purulent drainage from wound, increased redness or drainage from wound, foul odor from the wound/dressing, or calf pain - CONTACT YOUR SURGEON.                                                   FOLLOW-UP APPOINTMENTS:  If you do not already have a post-op appointment, please call the office for an appointment to be seen  by your surgeon.  Guidelines for how soon to be seen are listed in your After Visit Summary, but are typically between 1-4 weeks after surgery.  OTHER INSTRUCTIONS:   Knee Replacement:  Do not place pillow under knee, focus on keeping the knee straight while resting. CPM instructions: 0-90 degrees, 2 hours in the morning, 2 hours in the afternoon, and 2 hours in the evening. Place foam block, curve side up under heel at all times except when in CPM or when walking.  DO NOT modify, tear, cut, or change the foam block in any way.  MAKE SURE YOU:   Understand these instructions.   Get help right away if you are not doing well or get worse.    Thank you for letting us be a part of your medical care team.  It is a privilege we respect greatly.  We hope these instructions will help you stay on track for a fast and full recovery!

## 2016-05-14 NOTE — Procedures (Signed)
Pt does not wish to wear cpap will inform RT if anything changes. 

## 2016-05-14 NOTE — Plan of Care (Signed)
Problem: Pain Managment: Goal: General experience of comfort will improve Outcome: Progressing Medicated once for pain  Problem: Tissue Perfusion: Goal: Risk factors for ineffective tissue perfusion will decrease Outcome: Progressing No s/s of dvt

## 2016-05-14 NOTE — Progress Notes (Signed)
SPORTS MEDICINE AND JOINT REPLACEMENT  Georgena SpurlingStephen Lucey, MD    Laurier Nancyolby Hosier, PA-C [address], [place], [place]  [id number]                             [phone]   PROGRESS NOTE  Subjective:  negative for Chest Pain  negative for Shortness of Breath  negative for Nausea/Vomiting   negative for Calf Pain  negative for Bowel Movement   Tolerating Diet: yes         Patient reports pain as 5 on 0-10 scale.    Objective: Vital signs in last 24 hours:   Patient Vitals for the past 24 hrs:  BP Temp Temp src Pulse Resp SpO2  05/14/16 0513 (!) 98/54 99.7 F (37.6 C) Oral (!) 101 16 92 %  05/14/16 0035 (!) 95/52 99.4 F (37.4 C) Oral 90 16 93 %  05/13/16 2100 (!) 102/49 - - - - -  05/13/16 2052 (!) 80/29 98.4 F (36.9 C) Oral 93 16 92 %  05/13/16 1405 (!) 106/46 99.8 F (37.7 C) Oral 96 18 90 %    @flow {1959:LAST@   Intake/Output from previous day:   09/16 0701 - 09/17 0700 In: 1220 [P.O.:360; I.V.:860] Out: -    Intake/Output this shift:   No intake/output data recorded.   Intake/Output      09/16 0701 - 09/17 0700 09/17 0701 - 09/18 0700   P.O. 360    I.V. (mL/kg) 860 (7.4)    IV Piggyback     Total Intake(mL/kg) 1220 (10.5)    Urine (mL/kg/hr)     Blood     Total Output       Net +1220          Urine Occurrence 5 x       LABORATORY DATA:  Recent Labs  05/13/16 0340 05/14/16 0402  WBC 11.0* 15.9*  HGB 11.1* 11.2*  HCT 37.3 36.6  PLT 238 233    Recent Labs  05/13/16 0340  NA 137  K 4.8  CL 103  CO2 23  BUN 17  CREATININE 1.13*  GLUCOSE 130*  CALCIUM 8.9   Lab Results  Component Value Date   INR 1.14 05/02/2016    Examination:  General appearance: alert, cooperative and no distress Extremities: extremities normal, atraumatic, no cyanosis or edema  Wound Exam: clean, dry, intact   Drainage:  None: wound tissue dry  Motor Exam: Quadriceps and Hamstrings Intact  Sensory Exam: Superficial Peroneal, Deep Peroneal and Tibial  normal   Assessment:    2 Days Post-Op  Procedure(s) (LRB): TOTAL KNEE ARTHROPLASTY (Right)  ADDITIONAL DIAGNOSIS:  Active Problems:   Primary localized osteoarthritis of right knee  Acute Blood Loss Anemia   Plan: Physical Therapy as ordered Weight Bearing as Tolerated (WBAT)  DVT Prophylaxis:  eliquis  DISCHARGE PLAN: Skilled Nursing Facility/Rehab  DISCHARGE NEEDS: HHPT   Patient is improved, much more alert on exam today. Still waiting placement at SNF, patient is ready for D/C when bed available. Will continue to follow today.          Guy SandiferColby Alan Yasui 05/14/2016, 8:01 AM

## 2016-05-14 NOTE — Evaluation (Signed)
Occupational Therapy Evaluation Patient Details Name: Tamara [name] MRN: [id number] DOB: August 17, 1941 Today's Date: 05/14/2016    History of Present Illness 75 yo female with Right knee OA, s/p TKA on 03/11/16.   Clinical Impression   Pt. Requires extensive assist with ADLs and mobility and would benefit from ST SNF for rehab prior to d/c home. Pt. Was not able to stand from elevated bed with one person assist this AM. Pt. Is not safe to d/c home at this time. Pt. Is motivated to work with OT to increase I and safety with ADLs and mobility.     Follow Up Recommendations  SNF    Equipment Recommendations       Recommendations for Other Services       Precautions / Restrictions Precautions Precautions: Fall;Knee Required Braces or Orthoses: Knee Immobilizer - Right Knee Immobilizer - Right: On when out of bed or walking Restrictions Weight Bearing Restrictions: Yes RLE Weight Bearing: Weight bearing as tolerated      Mobility Bed Mobility         Supine to sit: Mod assist Sit to supine: Max assist   General bed mobility comments: cues for hand placement and use of trapeze.  Transfers                 General transfer comment: Pt. unable to stand with one person assist this AM    Balance                                            ADL Overall ADL's : Needs assistance/impaired Eating/Feeding: Independent   Grooming: Wash/dry hands;Wash/dry face;Brushing hair;Supervision/safety   Upper Body Bathing: Supervision/ safety;Set up   Lower Body Bathing: Maximal assistance   Upper Body Dressing : Set up;Minimal assistance   Lower Body Dressing: Maximal assistance               Functional mobility during ADLs:  (Pt. was not able to stand with assist of one person today.) General ADL Comments: Pt. will need further ed on use of AE and DME for ADLs.      Vision     Perception     Praxis      Pertinent Vitals/Pain Pain  Assessment: 0-10 Pain Score: 2  Pain Location:  (knee) Pain Descriptors / Indicators: Constant Pain Intervention(s): Monitored during session     Hand Dominance     Extremity/Trunk Assessment Upper Extremity Assessment Upper Extremity Assessment: Generalized weakness           Communication Communication Communication: No difficulties   Cognition Arousal/Alertness: Lethargic;Suspect due to medications Behavior During Therapy: [facility] for tasks assessed/performed Overall Cognitive Status: Within Functional Limits for tasks assessed                     General Comments       Exercises       Shoulder Instructions      Home Living Family/patient expects to be discharged to:: Skilled nursing facility Living Arrangements: Children                               Additional Comments: Plan to go to [facility] prior to return home.      Prior Functioning/Environment Level of Independence: Independent with assistive device(s)  Comments: Was using cane prior to admission        OT Problem List: Decreased activity tolerance;Decreased knowledge of use of DME or AE;Pain   OT Treatment/Interventions: Self-care/ADL training;DME and/or AE instruction;Therapeutic activities    OT Goals(Current goals can be found in the care plan section) Acute Rehab OT Goals Patient Stated Goal:  (go to rehab) OT Goal Formulation: With patient Time For Goal Achievement: 05/28/16 Potential to Achieve Goals: Good ADL Goals Pt Will Perform Lower Body Bathing: with supervision;with adaptive equipment;sit to/from stand Pt Will Perform Lower Body Dressing: with supervision;with adaptive equipment;sit to/from stand Pt Will Transfer to Toilet: with supervision;bedside commode;ambulating Pt Will Perform Toileting - Clothing Manipulation and hygiene: with supervision;sit to/from stand  OT Frequency: Min 2X/week   Barriers to D/C: Decreased caregiver support           Co-evaluation              End of Session    Activity Tolerance:   Patient left:     Time: [id number] OT Time Calculation (min): 47 min Charges:  OT General Charges $OT Visit: 1 Procedure OT Treatments $Self Care/Home Management : 23-37 mins G-Codes:    Letti Towell 05/14/2016, 10:38 AM

## 2016-05-14 NOTE — Progress Notes (Signed)
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    PT Comments    Pt very lethargic, suspected to be due to pain meds. +2 required for transfer bed to chair. Pt unable to perform stand pivot transfer. Pt able to ambulate 2 feet away from EOB. Bed pushed back and BSC placed behind pt. Upon completing toileting, pt stood, and BSC was swapped out with recliner. NT educated on technique used to assist with return to bed later today.  Follow Up Recommendations  SNF;Supervision/Assistance - 24 hour     Equipment Recommendations  None recommended by PT    Recommendations for Other Services       Precautions / Restrictions Precautions Precautions: Fall;Knee Required Braces or Orthoses: Knee Immobilizer - Right Knee Immobilizer - Right: On when out of bed or walking Restrictions Weight Bearing Restrictions: Yes RLE Weight Bearing: Weight bearing as tolerated    Mobility  Bed Mobility   Bed Mobility: Supine to Sit     Supine to sit: +2 for physical assistance;Mod assist     General bed mobility comments: verbal cues for sequencing, +rail  Transfers   Equipment used: Rolling walker (2 wheeled)   Sit to Stand: +2 physical assistance;Mod assist         General transfer comment: increased time to stabilize initial standing balance  Ambulation/Gait Ambulation/Gait assistance: Mod assist;+2 safety/equipment Ambulation Distance (Feet): 2 Feet Assistive device: Rolling walker (2 wheeled) Gait Pattern/deviations: Step-to pattern;Antalgic;Decreased stride length;Trunk flexed Gait velocity: decreased Gait velocity interpretation: Below normal speed for age/gender General Gait Details: Difficulty progress R/L LE for swing phase and difficulty with foot clearance bilat.    Stairs            Wheelchair Mobility     Modified Rankin (Stroke Patients Only)       Balance   Sitting-balance support: Feet supported;Bilateral upper extremity supported Sitting balance-Leahy Scale: Fair     Standing balance support: Bilateral upper extremity supported;During functional activity Standing balance-Leahy Scale: Poor                      Cognition Arousal/Alertness: Lethargic;Suspect due to medications Behavior During Therapy: Pride MedicalWFL for tasks assessed/performed Overall Cognitive Status: Within Functional Limits for tasks assessed                      Exercises Total Joint Exercises Ankle Circles/Pumps: AROM;Both;10 reps Quad Sets: AAROM;Right;5 reps Heel Slides: AAROM;Right;5 reps Goniometric ROM: 5-60 R knee    General Comments        Pertinent Vitals/Pain Pain Assessment: Faces Faces Pain Scale: Hurts a little bit Pain Location: sx site Pain Descriptors / Indicators: Grimacing Pain Intervention(s): Monitored during session;Repositioned;Ice applied    Home Living                      Prior Function            PT Goals (current goals can now be found in the care plan section) Acute Rehab PT Goals Patient Stated Goal: not stated PT Goal Formulation: With patient Time For Goal Achievement: 05/27/16 Potential to Achieve Goals: Good Progress towards PT goals: Progressing toward goals    Frequency    7X/week      PT Plan Current plan remains appropriate    Co-evaluation  End of Session Equipment Utilized During Treatment: Gait belt;Oxygen;Right knee immobilizer Activity Tolerance: Patient limited by lethargy Patient left: in chair;with call bell/phone within reach;with family/visitor present     Time: 1210-1237 PT Time Calculation (min) (ACUTE ONLY): 27 min  Charges:  $Therapeutic Activity: 23-37 mins                    G Codes:      Ilda Foil 05/14/2016, 1:51 PM

## 2016-05-14 NOTE — Progress Notes (Signed)
Orthopedic Tech Progress Note Patient Details:  Donalee Citrindna H Zunker [birth year] [id number] Ortho visit put on cpm at 1925 Patient ID: Donalee CitrinEdna H Biehl, female   DOB: [birth year], 75 y.o.   MRN: [id number]   Jennye MoccasinHughes, Grier Czerwinski Craig 05/14/2016, 7:24 PM

## 2016-05-15 ENCOUNTER — Encounter (HOSPITAL_COMMUNITY): Payer: Self-pay

## 2016-05-15 ENCOUNTER — Inpatient Hospital Stay (HOSPITAL_COMMUNITY): Payer: Medicare Other

## 2016-05-15 LAB — CBC
HEMATOCRIT: 33.4 % — AB (ref 36.0–46.0)
HEMOGLOBIN: 10.2 g/dL — AB (ref 12.0–15.0)
MCH: 27.4 pg (ref 26.0–34.0)
MCHC: 30.5 g/dL (ref 30.0–36.0)
MCV: 89.8 fL (ref 78.0–100.0)
Platelets: 230 10*3/uL (ref 150–400)
RBC: 3.72 MIL/uL — ABNORMAL LOW (ref 3.87–5.11)
RDW: 15.2 % (ref 11.5–15.5)
WBC: 13.6 10*3/uL — ABNORMAL HIGH (ref 4.0–10.5)

## 2016-05-15 LAB — GLUCOSE, CAPILLARY
GLUCOSE-CAPILLARY: 119 mg/dL — AB (ref 65–99)
Glucose-Capillary: 129 mg/dL — ABNORMAL HIGH (ref 65–99)
Glucose-Capillary: 95 mg/dL (ref 65–99)
Glucose-Capillary: 100 mg/dL — ABNORMAL HIGH (ref 65–99)
Glucose-Capillary: 198 mg/dL — ABNORMAL HIGH (ref 65–99)

## 2016-05-15 MED ORDER — ONDANSETRON HCL 4 MG PO TABS: 4.0000 mg | ORAL_TABLET | Freq: Four times a day (QID) | ORAL | 1 refills | Status: DC | PRN

## 2016-05-15 MED ORDER — HYDROCODONE-ACETAMINOPHEN 5-325 MG PO TABS
1.0000 | ORAL_TABLET | ORAL | Status: DC | PRN
  Administered 2016-05-16 – 2016-05-20 (×10): 1 via ORAL
  Filled 2016-05-15 (×10): qty 1

## 2016-05-15 MED ORDER — SODIUM CHLORIDE 0.9 % IV BOLUS (SEPSIS)
1000.0000 mL | Freq: Once | INTRAVENOUS | Status: AC
  Administered 2016-05-15: 1000 mL via INTRAVENOUS

## 2016-05-15 MED ORDER — HYDROCODONE-ACETAMINOPHEN 5-325 MG PO TABS: 1.0000 | ORAL_TABLET | ORAL | 0 refills | Status: DC | PRN

## 2016-05-15 NOTE — Progress Notes (Signed)
Xray result called to PA.

## 2016-05-15 NOTE — Clinical Social Work Note (Addendum)
Patient will discharge today per MD order. Patient will discharge to: [facility] RN to call report prior to transportation to: [phone] room 325 Transportation: PTAR scheduled  CSW sent discharge summary to SNF for review.    Tamara [name], MSW, LCSW  [phone]  Licensed Clinical Social Worker      [facility] contacted CSW to inform CSW that they are not able to accept any patients today or until further notice per the health department.  No other details given.  CSW informed patient and daughter Tamara [name].  Tamara [name]. CSW checking with admissions now.  RN has also been updated.  Patient will discharge today per MD order. Patient will discharge to: [facility] RN to call report prior to transportation to: [phone] Transportation- PTAR- to be called at 1:30 family request  CSW sent discharge summary to SNF for review.    Tamara [name], MSW, LCSW  [phone]  Licensed Clinical Social Worker

## 2016-05-15 NOTE — Clinical Social Work Note (Signed)
Clinical Social Work Assessment  Patient Details  Name: Tamara [name] MRN: [id number] Date of Birth: [birth year]  Date of referral:  05/15/16               Reason for consult:  Facility Placement                Permission sought to share information with:  Facility Medical sales representativeContact Representative, Family Supports Permission granted to share information::  Yes, Verbal Permission Granted  Name::        Agency::   ([facility] choice and open to SNF search for [facility] as well)  Relationship::   (daughter)  Contact Information:     Housing/Transportation Living arrangements for the past 2 months:  Single Family Home Source of Information:  Patient Patient Interpreter Needed:  None Criminal Activity/Legal Involvement Pertinent to Current Situation/Hospitalization:  No - Comment as needed Significant Relationships:  Adult Children Lives with:  Self Do you feel safe going back to the place where you live?  No Need for family participation in patient care:  No (Coment)  Care giving concerns:  Family wishes for patient to receive STR close to home.   Social Worker assessment / plan:  CSW discussed discharged plans with patient and patient's daughter.  Patient states she wishes to be discharged to [facility] in [place] at time of discharge.  Patient states she is from home alone and cannot return.    Employment status:  Retired Medical illustratornsurance information:  Managed Medicare (U[employer] Medicare) PT Recommendations:  Skilled Nursing Facility Information / Referral to community resources:  Skilled Nursing Facility  Patient/Family's Response to care:  Patient and daughter agreeable to SNF.  Patient/Family's Understanding of and Emotional Response to Diagnosis, Current Treatment, and Prognosis:  Patient and daughter appear anxious regarding discharge.  CSW offered support.  Emotional Assessment Appearance:  Appears stated age Attitude/Demeanor/Rapport:    Affect (typically observed):   Accepting Orientation:  Oriented to Self, Oriented to Place, Oriented to  Time, Oriented to Situation Alcohol / Substance use:  Not Applicable Psych involvement (Current and /or in the community):  No (Comment)  Discharge Needs  Concerns to be addressed:  No discharge needs identified Readmission within the last 30 days:  No Current discharge risk:  None Barriers to Discharge:  Continued Medical Work up   [employer]en West Financialngle, Kiora Hallberg C, LCSW 05/15/2016, 12:30 PM

## 2016-05-15 NOTE — Progress Notes (Signed)
PTAR arrived to pick pt up for transport to [facility] but pt v/s not in the perimeter required. B/P 75/33 O2 Sats in the 80's. Pt family had requested that with each time pt rec'd pain med to administer phenergan as the pain meds made her nauseated.  This am pt rec'd 5 mg of Oxy with rx'd phenergan which rendered the pt sleepy. Pt was arousable and answered questions appropriately as to name, place and circumstances and recognized family by name.  PA, Josh notified that pt was not appropriate to transport via PTAR today. Plan is to hold discharge until tomrw, withhold all phenergan, administer zofran only if needed and eval for transport in the am. Family present in pt room and aware of this plan. [facility] notified . PA made aware of v/s.

## 2016-05-15 NOTE — NC FL2 (Signed)
[place] MEDICAID FL2 LEVEL OF CARE SCREENING TOOL     IDENTIFICATION  Patient Name: Tamara [name] Birthdate: 1941/04/13 Sex: female Admission Date (Current Location): 05/12/2016  [facility] and [place] Number:  Chiropodist and Address:  [facility], [address] [place], [place] [address]      Provider Number: [id number]  Attending Physician Name and Address:  Frederico Hamman, MD  Relative Name and Phone Number:       Current Level of Care: Hospital Recommended Level of Care: Skilled Nursing Facility Prior Approval Number:    Date Approved/Denied:   PASRR Number:   [id number]   Discharge Plan: SNF    Current Diagnoses: Patient Active Problem List   Diagnosis Date Noted  . Primary localized osteoarthritis of right knee 05/12/2016  . Paroxysmal SVT (supraventricular tachycardia) (HCC) 03/01/2016  . Preprocedural cardiovascular examination 03/01/2016  . Pain in the chest 03/01/2016    Orientation RESPIRATION BLADDER Height & Weight     Self, Time, Situation, Place  O2 (4L) Continent Weight: 255 lb (115.7 kg) Height:  5\' 2"  (157.5 cm)  BEHAVIORAL SYMPTOMS/MOOD NEUROLOGICAL BOWEL NUTRITION STATUS      Continent Diet (please see dc summary for updated dietary needs)  AMBULATORY STATUS COMMUNICATION OF NEEDS Skin   Extensive Assist (Moderate 2+assist) Verbally Surgical wounds                       Personal Care Assistance Level of Assistance  Dressing, Bathing Bathing Assistance: Limited assistance   Dressing Assistance: Limited assistance     Functional Limitations Info             SPECIAL CARE FACTORS FREQUENCY  OT (By licensed OT), PT (By licensed PT)     PT Frequency: daily OT Frequency: daily            Contractures Contractures Info: Not present    Additional Factors Info  Allergies   Allergies Info: Codeine           Current Medications (05/15/2016):  This is the current hospital active  medication list Current Facility-Administered Medications  Medication Dose Route Frequency Provider Last Rate Last Dose  . 0.9 %  sodium chloride infusion   Intravenous Continuous Joshua Chadwell, PA-C 75 mL/hr at 05/13/16 1846    . acetaminophen (TYLENOL) tablet 650 mg  650 mg Oral Q6H PRN Margart Sickles, PA-C       Or  . acetaminophen (TYLENOL) suppository 650 mg  650 mg Rectal Q6H PRN Margart Sickles, PA-C      . acetaminophen (TYLENOL) tablet 1,000 mg  1,000 mg Oral QHS Joshua Chadwell, PA-C   1,000 mg at 05/14/16 2019  . apixaban (ELIQUIS) tablet 2.5 mg  2.5 mg Oral Q12H Joshua Chadwell, PA-C   2.5 mg at 05/15/16 0851  . bisacodyl (DULCOLAX) EC tablet 5 mg  5 mg Oral Daily PRN Margart Sickles, PA-C      . carvedilol (COREG) tablet 3.125 mg  3.125 mg Oral BID WC Joshua Chadwell, PA-C   3.125 mg at 05/15/16 0851  . docusate sodium (COLACE) capsule 100 mg  100 mg Oral QHS Joshua Chadwell, PA-C   100 mg at 05/14/16 2019  . furosemide (LASIX) tablet 20 mg  20 mg Oral Daily Joshua Chadwell, PA-C   20 mg at 05/15/16 0850  . insulin aspart (novoLOG) injection 0-20 Units  0-20 Units Subcutaneous TID WC Margart Sickles, PA-C   4 Units at 05/14/16  1200  . insulin aspart (novoLOG) injection 0-5 Units  0-5 Units Subcutaneous QHS Joshua Chadwell, PA-C      . insulin aspart (novoLOG) injection 6 Units  6 Units Subcutaneous TID WC Joshua Chadwell, PA-C   6 Units at 05/15/16 0854  . lactated ringers infusion   Intravenous Continuous Jairo Benarswell Jackson, MD 50 mL/hr at 05/12/16 0948    . levothyroxine (SYNTHROID, LEVOTHROID) tablet 150 mcg  150 mcg Oral QAC breakfast Margart SicklesJoshua Chadwell, PA-C   150 mcg at 05/15/16 0850  . menthol-cetylpyridinium (CEPACOL) lozenge 3 mg  1 lozenge Oral PRN Margart SicklesJoshua Chadwell, PA-C       Or  . phenol (CHLORASEPTIC) mouth spray 1 spray  1 spray Mouth/Throat PRN Joshua Chadwell, PA-C      . metoCLOPramide (REGLAN) tablet 5-10 mg  5-10 mg Oral Q8H PRN Joshua Chadwell, PA-C       Or  .  metoCLOPramide (REGLAN) injection 5-10 mg  5-10 mg Intravenous Q8H PRN Joshua Chadwell, PA-C   10 mg at 05/13/16 1454  . nitroGLYCERIN (NITROSTAT) SL tablet 0.4 mg  0.4 mg Sublingual Q5 min PRN Joshua Chadwell, PA-C      . ondansetron (ZOFRAN) tablet 4 mg  4 mg Oral Q6H PRN Joshua Chadwell, PA-C       Or  . ondansetron (ZOFRAN) injection 4 mg  4 mg Intravenous Q6H PRN Margart SicklesJoshua Chadwell, PA-C   4 mg at 05/14/16 2020  . oxyCODONE (Oxy IR/ROXICODONE) immediate release tablet 5-10 mg  5-10 mg Oral Q3H PRN Margart SicklesJoshua Chadwell, PA-C   5 mg at 05/15/16 1030  . promethazine (PHENERGAN) tablet 12.5 mg  12.5 mg Oral Q6H PRN Joshua Chadwell, PA-C   12.5 mg at 05/15/16 1031  . senna-docusate (Senokot-S) tablet 1 tablet  1 tablet Oral QHS PRN Margart SicklesJoshua Chadwell, PA-C      . sodium phosphate (FLEET) 7-19 GM/118ML enema 1 enema  1 enema Rectal Once PRN Margart SicklesJoshua Chadwell, PA-C      . traMADol (ULTRAM) tablet 100 mg  100 mg Oral Q6H PRN Margart SicklesJoshua Chadwell, PA-C   100 mg at 05/12/16 1601     Discharge Medications: Please see discharge summary for a list of discharge medications.  Relevant Imaging Results:  Relevant Lab Results:   Additional Information SSN: [id number]  Rondel Batonngle, Taina Landry C, LCSW

## 2016-05-15 NOTE — Progress Notes (Signed)
Pt unable to transport to [facility] but will transport to [facility] today. SW arranging transport via PTAR. Family informed/pt informed

## 2016-05-15 NOTE — Clinical Social Work Placement (Signed)
   CLINICAL SOCIAL WORK PLACEMENT  NOTE  Date:  05/15/2016  Patient Details  Name: Tamara [name] MRN: [id number] Date of Birth: 1941/05/27  Clinical Social Work is seeking post-discharge placement for this patient at the Skilled  Nursing Facility level of care (*CSW will initial, date and re-position this form in  chart as items are completed):  Yes   Patient/family provided with [facility] Clinical Social Work Department's list of facilities offering this level of care within the geographic area requested by the patient (or if unable, by the patient's family).  Yes   Patient/family informed of their freedom to choose among providers that offer the needed level of care, that participate in Medicare, Medicaid or managed care program needed by the patient, have an available bed and are willing to accept the patient.  Yes   Patient/family informed of [facility]'s ownership interest in [facility] and [facility], as well as of the fact that they are under no obligation to receive care at these facilities.  PASRR submitted to EDS on 05/15/16     PASRR number received on 05/15/16     Existing PASRR number confirmed on       FL2 transmitted to all facilities in geographic area requested by pt/family on 05/15/16     FL2 transmitted to all facilities within larger geographic area on       Patient informed that his/her managed care company has contracts with or will negotiate with certain facilities, including the following:        Yes   Patient/family informed of bed offers received.  [facility]     Physician recommends and patient chooses bed at      Patient to be transferred to [facility]  on 05/15/16.  Patient to be transferred to facility by PTAR     Patient family notified on 05/15/16 of transfer.  Name of family member notified:  daughter and patient both updated and agreeable to dc plans     PHYSICIAN Please prepare  priority discharge summary, including medications     Additional Comment:    _______________________________________________ Rondel BatonIngle, Annalissa Murphey C, LCSW 05/15/2016, 12:41 PM

## 2016-05-15 NOTE — Progress Notes (Signed)
[facility] to [facility]. SW to arrange transportation for 1:30 pm. Family advised and pt.

## 2016-05-15 NOTE — Clinical Social Work Placement (Signed)
   CLINICAL SOCIAL WORK PLACEMENT  NOTE  Date:  05/15/2016  Patient Details  Name: Donalee Citrindna H Laughery MRN: [id number] Date of Birth: 1940/08/31  Clinical Social Work is seeking post-discharge placement for this patient at the Skilled  Nursing Facility level of care (*CSW will initial, date and re-position this form in  chart as items are completed):  Yes   Patient/family provided with [facility] Clinical Social Work Department's list of facilities offering this level of care within the geographic area requested by the patient (or if unable, by the patient's family).  Yes   Patient/family informed of their freedom to choose among providers that offer the needed level of care, that participate in Medicare, Medicaid or managed care program needed by the patient, have an available bed and are willing to accept the patient.  Yes   Patient/family informed of [facility]'s ownership interest in [facility] and [facility], as well as of the fact that they are under no obligation to receive care at these facilities.  PASRR submitted to EDS on 05/15/16     PASRR number received on 05/15/16     Existing PASRR number confirmed on       FL2 transmitted to all facilities in geographic area requested by pt/family on 05/15/16     FL2 transmitted to all facilities within larger geographic area on       Patient informed that his/her managed care company has contracts with or will negotiate with certain facilities, including the following:        Yes   Patient/family informed of bed offers received.  Patient chooses bed at       Physician recommends and patient chooses bed at      Patient to be transferred to   on  .  Patient to be transferred to facility by       Patient family notified on   of transfer.  Name of family member notified:        PHYSICIAN       Additional Comment:    _______________________________________________ Rondel BatonIngle, Shawntez Dickison C, LCSW 05/15/2016, 11:40 AM

## 2016-05-15 NOTE — Progress Notes (Signed)
Physical Therapy Treatment Patient Details Name: Tamara [name] H Eanes MRN: [id number] DOB: 03/28/1941 Today's Date: 05/15/2016    History of Present Illness 75 yo female with Right knee OA, s/p TKA on 03/11/16.    PT Comments    Patient eager to get OOB to chair today. Tolerated standing with Max A and max cues for technique/sequencing and upright posture. Tolerated taking a few pivotal steps to chair with assist for weight shifting and advancement of RLE. Fatigues very easily. Performed exercises. Pt supposed to d/c to SNF today. Will follow.  Follow Up Recommendations  SNF;Supervision/Assistance - 24 hour     Equipment Recommendations  None recommended by PT    Recommendations for Other Services       Precautions / Restrictions Precautions Precautions: Fall;Knee Precaution Booklet Issued: Yes (comment) Precaution Comments: Provided exercise handout; reviewed precautions and no pillow under knee. Required Braces or Orthoses: Knee Immobilizer - Right Knee Immobilizer - Right: On when out of bed or walking Restrictions Weight Bearing Restrictions: Yes RLE Weight Bearing: Weight bearing as tolerated    Mobility  Bed Mobility               General bed mobility comments: Sitting EOB upon PT arrival. Mod Assist to scoot bottom to EOB.   Transfers Overall transfer level: Needs assistance Equipment used: Rolling walker (2 wheeled) Transfers: Sit to/from UGI CorporationStand;Stand Pivot Transfers Sit to Stand: Max assist Stand pivot transfers: Mod assist       General transfer comment: Increased time to stabilize in standing; cues for hand placement and technique;assist to power up and to extend hips for upright posture and for hand placement on RW. SPT to chair with Mod A to help with weight shifting, and RW management.  Ambulation/Gait Ambulation/Gait assistance: Mod assist Ambulation Distance (Feet): 3 Feet Assistive device: Rolling walker (2 wheeled) Gait Pattern/deviations: Step-to  pattern;Decreased stride length;Trunk flexed Gait velocity: decreased Gait velocity interpretation: Below normal speed for age/gender General Gait Details: Pt leaning over on RW on forearms despite cues; shuffling RLE to advance as ptnot able to clear it.   Stairs            Wheelchair Mobility    Modified Rankin (Stroke Patients Only)       Balance Overall balance assessment: Needs assistance Sitting-balance support: Feet supported;Bilateral upper extremity supported Sitting balance-Leahy Scale: Fair     Standing balance support: During functional activity Standing balance-Leahy Scale: Poor Standing balance comment: Reliant on BUEs for support ins tanding and external support.                    Cognition Arousal/Alertness: Lethargic Behavior During Therapy: WFL for tasks assessed/performed Overall Cognitive Status: Within Functional Limits for tasks assessed                      Exercises Total Joint Exercises Ankle Circles/Pumps: Both;10 reps;Supine Quad Sets: Both;10 reps;Supine Heel Slides: AAROM;Right;10 reps;Seated Hip ABduction/ADduction: Right;10 reps;Seated    General Comments General comments (skin integrity, edema, etc.): Daughters present during session.      Pertinent Vitals/Pain Pain Assessment: 0-10 Pain Score: 5  Pain Location: right knee Pain Descriptors / Indicators: Sore Pain Intervention(s): Monitored during session;Ice applied;Repositioned;Limited activity within patient's tolerance    Home Living                      Prior Function            PT Goals (current  goals can now be found in the care plan section) Progress towards PT goals: Progressing toward goals (slowly)    Frequency    7X/week      PT Plan Current plan remains appropriate    Co-evaluation             End of Session Equipment Utilized During Treatment: Gait belt;Oxygen;Right knee immobilizer Activity Tolerance: Patient  limited by lethargy;Patient tolerated treatment well Patient left: in chair;with call bell/phone within reach;with family/visitor present     Time: 1610-9604 PT Time Calculation (min) (ACUTE ONLY): 25 min  Charges:  $Gait Training: 8-22 mins $Therapeutic Exercise: 8-22 mins                    G Codes:      Elynn Patteson A Elizardo Chilson 05/15/2016, 11:42 AM Mylo Red, PT, DPT [phone]

## 2016-05-15 NOTE — Discharge Summary (Deleted)
PATIENT ID: Tamara [name]        MRN:  [id number]          DOB/AGE: 1941/01/25 / 75 y.o.    DISCHARGE SUMMARY  ADMISSION DATE:    05/12/2016 DISCHARGE DATE:   05/16/2016  ADMISSION DIAGNOSIS: OA RIGHT KNEE    DISCHARGE DIAGNOSIS:  OA RIGHT KNEE    ADDITIONAL DIAGNOSIS: Active Problems:   Primary localized osteoarthritis of right knee  Past Medical History:  Diagnosis Date  . Anemia    distant hx of  . Arthritis    KNEES,HANDS,WRIST  . Arthritis    KNEE PAIN, BONE ON BONE  . Asthma    allergy related  . 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 Bronchitis   . Cardiomegaly   . CHF (congestive heart failure) (HCC)    stopped using diuretics due to kidney issues, watching salt intake  . Chronic cough   . Chronic kidney disease    due to DM  . Complication of anesthesia    BP bottomed out during Achilles Tendon repair >60yrs ago  . Constipation due to pain medication   . Diabetes mellitus without complication (HCC)    type 2  . Diverticulitis   . Gout   . Hepatitis    childhood Hep A 75 yrs old  . History of blood transfusion   . Hypertension   . Hypothyroidism   . 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 Obesity   . Palpitations   . Pneumonia    hx of- 05/02/16- last time approx - 6 years ago  . Shortness of breath dyspnea    with exertion  . SVT (supraventricular tachycardia) (HCC)   . Wheezing     PROCEDURE: Procedure(s): TOTAL KNEE ARTHROPLASTY Right on 05/12/2016  CONSULTS: pt/ot/sw    HISTORY:  See H&P in chart  HOSPITAL COURSE:  [name] is a 75 y.o. admitted on 05/12/2016 and found to have a diagnosis of OA RIGHT KNEE.  After appropriate laboratory studies were obtained  they were taken to the operating room on 05/12/2016 and underwent  Procedure(s): TOTAL KNEE ARTHROPLASTY  Right.   They were given perioperative antibiotics:  Anti-infectives    Start     Dose/Rate Route Frequency Ordered Stop   05/12/16  1630  ceFAZolin (ANCEF) IVPB 2g/100 mL premix     2 g 200 mL/hr over 30 Minutes Intravenous Every 6 hours 05/12/16 1445 05/12/16 2349   05/12/16 1000  ceFAZolin (ANCEF) 3 g in dextrose 5 % 50 mL IVPB     3 g 130 mL/hr over 30 Minutes Intravenous To [facility] 05/11/16 0828 05/12/16 1056    .  Tolerated the procedure well.    POD #1, allowed out of bed to a chair.  PT for ambulation and exercise program.   IV saline locked.  O2 discontionued.  POD #2, continued PT and ambulation.   POD#3, continued PT, transport arrived to take pt to SNF and BP 75/33 following oxycodone and phenergan dose.    The remainder of the hospital course was dedicated to ambulation and strengthening.   The patient was discharged on 4 days post op in  Stable condition.  Blood products given:none  DIAGNOSTIC STUDIES: Recent vital signs:  Patient Vitals for the past 24 hrs:  BP Temp Temp src Pulse Resp SpO2  05/15/16 1600 (!) 75/33 - - (!) 109 - 91 %  05/15/16 0335 (!) 92/51 98.3 F (36.8 C) Oral 87 16 95 %  05/14/16 2033 (!) 96/56 99.4 F (37.4 C) Oral 90 16 94 %       Recent laboratory studies:  Recent Labs  05/13/16 0340 05/14/16 0402 05/15/16 0302  WBC 11.0* 15.9* 13.6*  HGB 11.1* 11.2* 10.2*  HCT 37.3 36.6 33.4*  PLT 238 233 230    Recent Labs  05/13/16 0340  NA 137  K 4.8  CL 103  CO2 23  BUN 17  CREATININE 1.13*  GLUCOSE 130*  CALCIUM 8.9   Lab Results  Component Value Date   INR 1.14 05/02/2016     Recent Radiographic Studies :  Dg Chest 2 View  Result Date: 05/02/2016 CLINICAL DATA:  Preop for knee replacement. EXAM: CHEST  2 VIEW COMPARISON:  11/20/2015 FINDINGS: Linear areas of scarring in the mid and lower lungs bilaterally. Heart is normal size. No confluent opacities or effusions. No acute bony abnormality. IMPRESSION: Areas of scarring in the lungs bilaterally.  No active disease. Electronically Signed   By: Charlett Nose M.D.   On: 05/02/2016 12:32     DISCHARGE INSTRUCTIONS:   DISCHARGE MEDICATIONS:     Medication List    STOP taking these medications   aspirin EC 81 MG tablet   ibuprofen 200 MG tablet Commonly known as:  ADVIL,MOTRIN     TAKE these medications   acetaminophen 500 MG tablet Commonly known as:  TYLENOL Take 1,000 mg by mouth at bedtime.   apixaban 2.5 MG Tabs tablet Commonly known as:  ELIQUIS Take 1 tablet (2.5 mg total) by mouth 2 (two) times daily.   carvedilol 3.125 MG tablet Commonly known as:  COREG Take 3.125 mg by mouth 2 (two) times daily with a meal.   docusate sodium 100 MG capsule Commonly known as:  COLACE Take 100 mg by mouth at bedtime.   furosemide 20 MG tablet Commonly known as:  LASIX Take 20 mg by mouth daily.   glipiZIDE 2.5 MG 24 hr tablet Commonly known as:  GLUCOTROL XL Take 2.5 mg by mouth daily with breakfast.   INVOKANA 300 MG Tabs tablet Generic drug:  canagliflozin Take 300 mg by mouth daily before breakfast.   levothyroxine 150 MCG tablet Commonly known as:  SYNTHROID, LEVOTHROID Take 150 mcg by mouth daily before breakfast.   nitroGLYCERIN 0.4 MG SL tablet Commonly known as:  NITROSTAT Place 0.4 mg under the tongue every 5 (five) minutes as needed for chest pain. Max 3   ondansetron 4 MG tablet Commonly known as:  ZOFRAN Take 1 tablet (4 mg total) by mouth every 6 (six) hours as needed for nausea.   ONE TOUCH ULTRA TEST test strip Generic drug:  glucose blood Apply 1 strip topically as directed.   oxyCODONE 5 MG immediate release tablet Commonly known as:  ROXICODONE 1-2 tabs po q4-6hrs prn pain   traMADol 50 MG tablet Commonly known as:  ULTRAM Take 2 tablets (100 mg total) by mouth every 6 (six) hours as needed. What changed:  how much to take  when to take this       FOLLOW UP VISIT:   Follow-up Information    CAFFREY JR,W D, MD. Schedule an appointment as soon as possible for a visit in 2 week(s).   Specialty:  Orthopedic  Surgery Contact information: [address] [place] [address] [phone]        [facility] .   Why:  Home Health Physical Therapy Contact information: [address] [place] [address] [phone]           DISPOSITION:   Skilled Nursing Facility/Rehab  CONDITION:  Stable   Margart SicklesJoshua Murray Guzzetta, PA-C  05/15/2016 6:05 PM

## 2016-05-15 NOTE — Care Management Important Message (Signed)
Important Message  Patient Details  Name: Tamara [name] MRN: [id number] Date of Birth: [birth year]   Medicare Important Message Given:  Yes    Dino Borntreger 05/15/2016, 11:05 AM

## 2016-05-15 NOTE — Progress Notes (Signed)
Subjective: 3 Days Post-Op Procedure(s) (LRB): TOTAL KNEE ARTHROPLASTY (Right) Patient reports pain as 8 on 0-10 scale.    Objective: Vital signs in last 24 hours: Temp:  [98.3 F (36.8 C)-99.7 F (37.6 C)] 98.3 F (36.8 C) (09/18 0335) Pulse Rate:  [87-96] 87 (09/18 0335) Resp:  [16] 16 (09/18 0335) BP: (92-106)/(51-56) 92/51 (09/18 0335) SpO2:  [94 %-95 %] 95 % (09/18 0335)  Intake/Output from previous day: 09/17 0701 - 09/18 0700 In: 360 [P.O.:360] Out: -  Intake/Output this shift: No intake/output data recorded.   Recent Labs  05/13/16 0340 05/14/16 0402 05/15/16 0302  HGB 11.1* 11.2* 10.2*    Recent Labs  05/14/16 0402 05/15/16 0302  WBC 15.9* 13.6*  RBC 4.09 3.72*  HCT 36.6 33.4*  PLT 233 230    Recent Labs  05/13/16 0340  NA 137  K 4.8  CL 103  CO2 23  BUN 17  CREATININE 1.13*  GLUCOSE 130*  CALCIUM 8.9   No results for input(s): LABPT, INR in the last 72 hours.  Neurovascular intact Sensation intact distally Dorsiflexion/Plantar flexion intact Incision: dressing C/D/I, scant drainage and dressing changed today Compartment soft  Mild erythema surrounding incision possibly staple reaction  Assessment/Plan: 3 Days Post-Op Procedure(s) (LRB): TOTAL KNEE ARTHROPLASTY (Right) Up with therapy Discharge to SNF  Tamara [name] 05/15/2016, 8:50 AM

## 2016-05-15 NOTE — Progress Notes (Signed)
Patient ID: Tamara [name], female   DOB: Jan 15, 1941, 75 y.o.   MRN: [id number]   Spoke with pt re BP drop following phenergan and oxycodone.  Will switch to zofran prn and hydrocodone as a breakthrough med secondary to ordered tramadol.  Plan on d/c SNF tomorrow.

## 2016-05-16 DIAGNOSIS — A419 Sepsis, unspecified organism: Secondary | ICD-10-CM

## 2016-05-16 DIAGNOSIS — J189 Pneumonia, unspecified organism: Secondary | ICD-10-CM

## 2016-05-16 DIAGNOSIS — Y95 Nosocomial condition: Secondary | ICD-10-CM

## 2016-05-16 DIAGNOSIS — J9601 Acute respiratory failure with hypoxia: Secondary | ICD-10-CM | POA: Diagnosis present

## 2016-05-16 DIAGNOSIS — I959 Hypotension, unspecified: Secondary | ICD-10-CM

## 2016-05-16 DIAGNOSIS — R7981 Abnormal blood-gas level: Secondary | ICD-10-CM

## 2016-05-16 DIAGNOSIS — M1711 Unilateral primary osteoarthritis, right knee: Principal | ICD-10-CM

## 2016-05-16 DIAGNOSIS — I214 Non-ST elevation (NSTEMI) myocardial infarction: Secondary | ICD-10-CM

## 2016-05-16 HISTORY — DX: Pneumonia, unspecified organism: J18.9

## 2016-05-16 HISTORY — DX: Hypotension, unspecified: I95.9

## 2016-05-16 HISTORY — DX: Nosocomial condition: Y95

## 2016-05-16 HISTORY — DX: Sepsis, unspecified organism: A41.9

## 2016-05-16 LAB — CBC
HEMATOCRIT: 30.7 % — AB (ref 36.0–46.0)
HEMOGLOBIN: 9.4 g/dL — AB (ref 12.0–15.0)
MCH: 27.4 pg (ref 26.0–34.0)
MCHC: 30.6 g/dL (ref 30.0–36.0)
MCV: 89.5 fL (ref 78.0–100.0)
Platelets: 217 10*3/uL (ref 150–400)
RBC: 3.43 MIL/uL — ABNORMAL LOW (ref 3.87–5.11)
RDW: 15.3 % (ref 11.5–15.5)
WBC: 11.6 10*3/uL — ABNORMAL HIGH (ref 4.0–10.5)

## 2016-05-16 LAB — TROPONIN I
TROPONIN I: 0.46 ng/mL — AB (ref <0.03)
Troponin I: 1.04 ng/mL (ref <0.03)
Troponin I: 0.72 ng/mL (ref <0.03)
Troponin I: 0.39 ng/mL (ref <0.03)

## 2016-05-16 LAB — LACTIC ACID, PLASMA
Lactic Acid, Venous: 1.5 mmol/L (ref 0.5–1.9)
Lactic Acid, Venous: 1.1 mmol/L (ref 0.5–1.9)

## 2016-05-16 LAB — BASIC METABOLIC PANEL
Anion gap: 10 (ref 5–15)
BUN: 33 mg/dL — AB (ref 6–20)
CALCIUM: 8 mg/dL — AB (ref 8.9–10.3)
CHLORIDE: 104 mmol/L (ref 101–111)
CO2: 21 mmol/L — AB (ref 22–32)
CREATININE: 1.3 mg/dL — AB (ref 0.44–1.00)
GFR calc non Af Amer: 39 mL/min — ABNORMAL LOW (ref >60)
GFR, EST AFRICAN AMERICAN: 45 mL/min — AB (ref >60)
GLUCOSE: 125 mg/dL — AB (ref 65–99)
Potassium: 3.9 mmol/L (ref 3.5–5.1)
Sodium: 135 mmol/L (ref 135–145)

## 2016-05-16 LAB — GLUCOSE, CAPILLARY
GLUCOSE-CAPILLARY: 115 mg/dL — AB (ref 65–99)
GLUCOSE-CAPILLARY: 130 mg/dL — AB (ref 65–99)
GLUCOSE-CAPILLARY: 127 mg/dL — AB (ref 65–99)
Glucose-Capillary: 100 mg/dL — ABNORMAL HIGH (ref 65–99)

## 2016-05-16 LAB — PROCALCITONIN: PROCALCITONIN: 0.54 ng/mL

## 2016-05-16 LAB — STREP PNEUMONIAE URINARY ANTIGEN: STREP PNEUMO URINARY ANTIGEN: NEGATIVE

## 2016-05-16 LAB — PROTIME-INR
INR: 2.06
Prothrombin Time: 23.6 seconds — ABNORMAL HIGH (ref 11.4–15.2)

## 2016-05-16 LAB — APTT: APTT: 42 s — AB (ref 24–36)

## 2016-05-16 MED ORDER — DEXTROSE 5 % IV SOLN
1.0000 g | Freq: Two times a day (BID) | INTRAVENOUS | Status: DC
  Filled 2016-05-16: qty 1

## 2016-05-16 MED ORDER — DEXTROSE 5 % IV SOLN
1.0000 g | Freq: Three times a day (TID) | INTRAVENOUS | Status: DC
  Filled 2016-05-16 (×2): qty 1

## 2016-05-16 MED ORDER — DOXYCYCLINE HYCLATE 100 MG IV SOLR
100.0000 mg | Freq: Two times a day (BID) | INTRAVENOUS | Status: DC
  Administered 2016-05-16: 100 mg via INTRAVENOUS
  Filled 2016-05-16 (×2): qty 100

## 2016-05-16 MED ORDER — VANCOMYCIN HCL 10 G IV SOLR
1500.0000 mg | INTRAVENOUS | Status: DC
  Administered 2016-05-17 – 2016-05-19 (×3): 1500 mg via INTRAVENOUS
  Filled 2016-05-16 (×5): qty 1500

## 2016-05-16 MED ORDER — VANCOMYCIN HCL IN DEXTROSE 750-5 MG/150ML-% IV SOLN: 750.0000 mg | INTRAVENOUS | Status: DC

## 2016-05-16 MED ORDER — VANCOMYCIN HCL 10 G IV SOLR
1500.0000 mg | Freq: Once | INTRAVENOUS | Status: AC
  Administered 2016-05-16: 1500 mg via INTRAVENOUS
  Filled 2016-05-16 (×2): qty 1500

## 2016-05-16 MED ORDER — SODIUM CHLORIDE 0.9 % IV BOLUS (SEPSIS)
1500.0000 mL | Freq: Once | INTRAVENOUS | Status: AC
  Administered 2016-05-16: 1500 mL via INTRAVENOUS

## 2016-05-16 MED ORDER — SODIUM CHLORIDE 0.9 % IV BOLUS (SEPSIS)
1000.0000 mL | Freq: Once | INTRAVENOUS | Status: AC
  Administered 2016-05-16: 1000 mL via INTRAVENOUS

## 2016-05-16 MED ORDER — GUAIFENESIN ER 600 MG PO TB12
1200.0000 mg | ORAL_TABLET | Freq: Two times a day (BID) | ORAL | Status: DC
  Administered 2016-05-16 – 2016-05-20 (×8): 1200 mg via ORAL
  Filled 2016-05-16 (×9): qty 2

## 2016-05-16 MED ORDER — PIPERACILLIN-TAZOBACTAM 3.375 G IVPB
3.3750 g | Freq: Three times a day (TID) | INTRAVENOUS | Status: DC
  Administered 2016-05-16 – 2016-05-19 (×8): 3.375 g via INTRAVENOUS
  Filled 2016-05-16 (×15): qty 50

## 2016-05-16 NOTE — Progress Notes (Signed)
Orthopedic Tech Progress Note Patient Details:  Donalee Citrindna H Marsee October 08, 1940 [id number] Off cpm at 1930 Patient ID: Donalee CitrinEdna H [place]hurst, female   DOB: October 08, 1940, 75 y.o.   MRN: [id number]   Jennye MoccasinHughes, Stacey Maura Craig 05/16/2016, 8:24 PM

## 2016-05-16 NOTE — Progress Notes (Signed)
Pharmacy Antibiotic Note  Tamara [name] is a 75 y.o. female admitted on 05/12/2016 with right knee pain and underwent right TKA on 05/12/16.  Patient developed cough an congestion over the past few days and then became hypotensive.  Pharmacy has been consulted to add Zosyn to vancomycin for HCAP.  Her renal function is worsening.  Plan: - Zosyn 3.375gm IV Q8H, 4 hr infusion - Continue vanc 750mg  IV Q24H - Monitor renal fxn, clinical progress, vanc trough as indicated   Height: 5\' 2"  (157.5 cm) Weight: 270 lb 4.5 oz (122.6 kg) IBW/kg (Calculated) : 50.1  Temp (24hrs), Avg:98.4 F (36.9 C), Min:98.2 F (36.8 C), Max:98.5 F (36.9 C)   Recent Labs Lab 05/13/16 0340 05/14/16 0402 05/15/16 0302 05/16/16 0156 05/16/16 0157 05/16/16 0421  WBC 11.0* 15.9* 13.6* 11.6*  --   --   CREATININE 1.13*  --   --  1.30*  --   --   LATICACIDVEN  --   --   --   --  1.5 1.1    Estimated Creatinine Clearance: 46.7 mL/min (by C-G formula based on SCr of 1.3 mg/dL (H)).    Allergies  Allergen Reactions  . Codeine Nausea And Vomiting    Antimicrobials this admission:  Vanc 9/19 >> Zosyn 9/19 >> Doxy 9/19 >> 9/19  Dose adjustments this admission:  N/A  Microbiology results:  9/19 BCx x2 -   Chelsea Aushuy D. Laney Potashang, PharmD, BCPS Pager:  [phone] 05/16/2016, 7:46 AM

## 2016-05-16 NOTE — Progress Notes (Signed)
SLP Cancellation Note  Patient Details Name: Tamara [name] MRN: [id number] DOB: February 02, 1941   Cancelled treatment:       Reason Eval/Treat Not Completed: Other (comment) Pt was toileting. RN asked SLP to come back another time. Will f/u at a later time.  Tollie EthHaleigh Ragan Sanaii Caporaso, Student SLP  Caryl NeverHaleigh R Denham Mose 05/16/2016, 3:11 PM

## 2016-05-16 NOTE — Progress Notes (Signed)
CRITICAL VALUE ALERT  Critical value received:  Troponin 1.04  Date of notification:  05/16/2016   Time of notification:  0254  Critical value read back: yes  Nurse who received alert:  Birdena CrandallAmanda Kyston Gonce RN  MD notified (1st page):  Maren ReamerKaren Kirby  Time of first page:  2:57 AM   MD notified (2nd page):  Time of second page:  Responding MD:    Time MD responded:  [phone]

## 2016-05-16 NOTE — Progress Notes (Signed)
Troponin resulted at 1.04. Pt is without any active chest pain. NP spoke with Dr. Julian ReilGardner of [facility] given he did consult on pt tonight. Pt is on Eliquis already and had a normal stress test in July 2017. Spoke with Dr. Ronelle NighMacLean of cardio who asked that cardio be called in am. Case was discussed with him.  KJKG, NP [facility]

## 2016-05-16 NOTE — Progress Notes (Signed)
Orthopedic Tech Progress Note Patient Details:  Tamara Citrindna H Denham 12-14-1940 [id number] Ortho visit put on cpm at 1730 Patient ID: Tamara [name], female   DOB: 12-14-1940, 75 y.o.   MRN: [id number]   Tamara [name], Tamara [name] 05/16/2016, 5:35 PM

## 2016-05-16 NOTE — Progress Notes (Signed)
[id number]

## 2016-05-16 NOTE — Consult Note (Addendum)
Medical Consultation   Tamara Citrindna H Carlton  WGN:[id number]  DOB: Dec 30, 1940  DOA: 05/12/2016  PCP: Alan MulderMORAYATI,SHAMIL J, MD  Outpatient Specialists: Rennis GoldenHilty (Cardiology)   Requesting physician: Margart SicklesJoshua Chadwell, PA-C  Reason for consultation: HAP, hypotension   History of Present Illness: Tamara [name] is an 75 y.o. female admitted to hospital 4 days ago for TKA.  She developed cough and congestion over the past day or so while in hospital.  Cough is productive.  She has also developed a new oxygen requirement, sating 90% on 2L CPAP.  Mild tachycardia to the low 100s and hypotension to the 80s systolic.  Tachycardia and hypotension are improving with ongoing IVF.  Ortho has ordered 2L bolus thus far which has finished.  CXR demonstrates RLL PNA.   Review of Systems:  ROS As per HPI otherwise 10 point review of systems negative.     Past Medical History: Past Medical History:  Diagnosis Date  . Anemia    distant hx of  . Arthritis    KNEES,HANDS,WRIST  . Arthritis    KNEE PAIN, BONE ON BONE  . Asthma    allergy related  . 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 Bronchitis   . Cardiomegaly   . CHF (congestive heart failure) (HCC)    stopped using diuretics due to kidney issues, watching salt intake  . Chronic cough   . Chronic kidney disease    due to DM  . Complication of anesthesia    BP bottomed out during Achilles Tendon repair >418yrs ago  . Constipation due to pain medication   . Diabetes mellitus without complication (HCC)    type 2  . Diverticulitis   . Gout   . Hepatitis    childhood Hep A 75 yrs old  . History of blood transfusion   . Hypertension   . Hypothyroidism   . 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 Obesity   . Palpitations   . Pneumonia    hx of- 05/02/16- last time approx - 6 years ago  . Shortness of breath dyspnea    with exertion  . SVT (supraventricular tachycardia) (HCC)   . Wheezing      Past Surgical History: Past Surgical History:  Procedure Laterality Date  . ABDOMINAL HYSTERECTOMY    . ABDOMINAL HYSTERECTOMY  1984  . ACHILLES TENDON REPAIR Left   . CARDIAC CATHETERIZATION     [place]  . CATARACT EXTRACTION W/PHACO Left 02/10/2015   Procedure: CATARACT EXTRACTION PHACO AND INTRAOCULAR LENS PLACEMENT (IOC);  Surgeon: Lockie Molahadwick Brasington, MD;  [facility];  Service: Ophthalmology;  Laterality: Left;  DIABETIC  . CATARACT EXTRACTION W/PHACO Right 02/24/2015   Procedure: CATARACT EXTRACTION PHACO AND INTRAOCULAR LENS PLACEMENT (IOC);  Surgeon: Lockie Molahadwick Brasington, MD;  [facility];  Service: Ophthalmology;  Laterality: Right;  . CESAREAN SECTION  1978  . CHOLECYSTECTOMY  1986  . EYE SURGERY Bilateral    Cataract   . TOTAL KNEE ARTHROPLASTY Right 05/12/2016   Procedure: TOTAL KNEE ARTHROPLASTY;  Surgeon: Frederico Hammananiel Caffrey, MD;  [facility];  Service: Orthopedics;  Laterality: Right;     Allergies:   Allergies  Allergen Reactions  . Codeine Nausea And Vomiting     Social History:  reports that she has never smoked. She has quit using smokeless tobacco. She reports that she does  not drink alcohol or use drugs.   Family History: Family History  Problem Relation Age of Onset  . Lung cancer Mother   . Heart attack Mother   . Heart Problems Father   . Hypertension Father   . Other Father     respiratory problems  . Ovarian cancer Maternal Grandmother   . Cancer Maternal Grandfather   . Stroke Paternal Grandmother   . Diabetes Brother   . Liver cancer Brother   . COPD Sister   . Other Sister     sepsis  . Heart attack Son   . Heart attack Son   . Rheum arthritis Daughter   . Rheum arthritis Daughter   . Thyroid cancer Daughter   . Hepatitis C Daughter       Physical Exam: Vitals:   05/15/16 2356 05/16/16 0001 05/16/16 0100 05/16/16 0140  BP:  (!) 84/33 (!) 87/43 (!) 92/48  Pulse: 89   82  Resp: 18    15  Temp:      TempSrc:      SpO2: 92%   93%  Weight:      Height:        Constitutional:Alert and awake, oriented x3, not in any acute distress. Eyes: PERLA, EOMI, irises appear normal, anicteric sclera,  ENMT: external ears and nose appear normal,             Lips appears normal, oropharynx mucosa, tongue, posterior pharynx appear normal  Neck: neck appears normal, no masses, normal ROM, no thyromegaly, no JVD  CVS: S1-S2 clear, no murmur rubs or gallops, no LE edema, normal pedal pulses  Respiratory:  clear to auscultation bilaterally, no wheezing, rales or rhonchi. Respiratory effort normal. No accessory muscle use.  Abdomen: soft nontender, nondistended, normal bowel sounds, no hepatosplenomegaly, no hernias  Musculoskeletal: : no cyanosis, clubbing or edema noted bilaterally, RLE is under knee immobilizer and bandage which is C/D/I Neuro: Cranial nerves II-XII intact, strength, sensation, reflexes Psych: judgement and insight appear normal, stable mood and affect, mental status Skin: no rashes or lesions or ulcers, no induration or nodules   Data reviewed:  I have personally reviewed following labs and imaging studies Labs:  CBC:  Recent Labs Lab 05/13/16 0340 05/14/16 0402 05/15/16 0302  WBC 11.0* 15.9* 13.6*  HGB 11.1* 11.2* 10.2*  HCT 37.3 36.6 33.4*  MCV 90.8 89.5 89.8  PLT 238 233 230    Basic Metabolic Panel:  Recent Labs Lab 05/13/16 0340  NA 137  K 4.8  CL 103  CO2 23  GLUCOSE 130*  BUN 17  CREATININE 1.13*  CALCIUM 8.9   GFR Estimated Creatinine Clearance: 51.8 mL/min (by C-G formula based on SCr of 1.13 mg/dL (H)). Liver Function Tests: No results for input(s): AST, ALT, ALKPHOS, BILITOT, PROT, ALBUMIN in the last 168 hours. No results for input(s): LIPASE, AMYLASE in the last 168 hours. No results for input(s): AMMONIA in the last 168 hours. Coagulation profile No results for input(s): INR, PROTIME in the last 168 hours.  Cardiac  Enzymes: No results for input(s): CKTOTAL, CKMB, CKMBINDEX, TROPONINI in the last 168 hours. BNP: Invalid input(s): POCBNP CBG:  Recent Labs Lab 05/14/16 1640 05/15/16 0605 05/15/16 1137 05/15/16 1726 05/15/16 2054  GLUCAP 115* 95 100* 198* 119*   D-Dimer No results for input(s): DDIMER in the last 72 hours. Hgb A1c No results for input(s): HGBA1C in the last 72 hours. Lipid Profile No results for input(s): CHOL, HDL, LDLCALC, TRIG, CHOLHDL, LDLDIRECT  in the last 72 hours. Thyroid function studies No results for input(s): TSH, T4TOTAL, T3FREE, THYROIDAB in the last 72 hours.  Invalid input(s): FREET3 Anemia work up No results for input(s): VITAMINB12, FOLATE, FERRITIN, TIBC, IRON, RETICCTPCT in the last 72 hours. Urinalysis    Component Value Date/Time   COLORURINE YELLOW 05/02/2016 1115   APPEARANCEUR CLOUDY (A) 05/02/2016 1115   APPEARANCEUR Clear 08/01/2013 1926   LABSPEC 1.034 (H) 05/02/2016 1115   LABSPEC 1.015 08/01/2013 1926   PHURINE 5.0 05/02/2016 1115   GLUCOSEU >1000 (A) 05/02/2016 1115   GLUCOSEU Negative 08/01/2013 1926   HGBUR MODERATE (A) 05/02/2016 1115   BILIRUBINUR NEGATIVE 05/02/2016 1115   BILIRUBINUR Negative 08/01/2013 1926   KETONESUR NEGATIVE 05/02/2016 1115   PROTEINUR NEGATIVE 05/02/2016 1115   NITRITE NEGATIVE 05/02/2016 1115   LEUKOCYTESUR MODERATE (A) 05/02/2016 1115   LEUKOCYTESUR Negative 08/01/2013 1926     Microbiology No results found for this or any previous visit (from the past 240 hour(s)).     Inpatient Medications:   Scheduled Meds: . apixaban  2.5 mg Oral Q12H  . ceFEPime (MAXIPIME) IV  1 g Intravenous Q8H  . docusate sodium  100 mg Oral QHS  . insulin aspart  0-20 Units Subcutaneous TID WC  . insulin aspart  0-5 Units Subcutaneous QHS  . insulin aspart  6 Units Subcutaneous TID WC  . levothyroxine  150 mcg Oral QAC breakfast   Continuous Infusions: . sodium chloride 75 mL/hr at 05/13/16 1846      Radiological Exams on Admission: Dg Chest Port 1 View  Result Date: 05/15/2016 CLINICAL DATA:  Decreased oxygen saturation. Low blood pressure. History of CHF, hypertension, asthma. EXAM: PORTABLE CHEST 1 VIEW COMPARISON:  05/02/2016 FINDINGS: Increasing linear infiltrates and volume loss in the right mid and lower lungs likely represent developing area of pneumonia. Normal heart size and pulmonary vascularity. No blunting of costophrenic angles. No pneumothorax. Calcification of the aorta. Degenerative changes in the spine and shoulders. IMPRESSION: Increasing linear infiltrates and volume loss in the right mid and lower lungs may represent developing area of pneumonia. Electronically Signed   By: Burman Nieves M.D.   On: 05/15/2016 22:16    Impression/Recommendations Principal Problem:   HAP (hospital-acquired pneumonia) Active Problems:   Primary localized osteoarthritis of right knee   Sepsis associated hypotension (HCC)   Acute respiratory failure with hypoxia (HCC)  1. HAP - 1. PNA pathway 2. Cefepime and vanc 3. Cultures pending 2. Hypotension, likely secondary to sepsis - 1. CBC, BMP, lactate, cultures, pro calcitonin, pending 2. T is currently 99.4 orally 3. WBC 11.6k 4. PE is considered, however is less likely given that she is already on full dose Eliquis (which we plan to continue), symptoms of PNA, CXR findings of PNA, positive BP and tachycardia response to IVF. 1. If creatinine (currently pending) is normal, may wish to get CTA for definitive rule out / in. 5. Holding lasix and coreg 6. Transferring patient to SDU 3. Acute resp failure with hypoxia - believed to be due to PNA 1. O2 monitoring and O2 as needed 4. EKG demonstrating new T wave inversion in lateral leads - 1. Troponin pending 2. Of note did have low risk stress test in July 3. No chest pain, doubt ACS   Thank you for this consultation.  Our [facility] hospitalist team will follow the patient with  you.   Time Spent: 80 min  Tuvia Woodrick M. D.O. [facility] 05/16/2016, 2:04 AM

## 2016-05-16 NOTE — Progress Notes (Signed)
Orthopedic Tech Progress Note Patient Details:  Tamara Citrindna H Plaia May 06, 1941 [id number]  Patient ID: Tamara [name], female   DOB: May 06, 1941, 75 y.o.   MRN: [id number] Applied cpm 0-55  [name], [name] 05/16/2016, 3:18 AM

## 2016-05-16 NOTE — Progress Notes (Signed)
Subjective: 4 Days Post-Op Procedure(s) (LRB): TOTAL KNEE ARTHROPLASTY (Right) Patient reports pain as mild as long as shes not moving.  Hypotensive and hypoxic last night chest xray revealed HAP transferred to SDU. Patient reports she is feeling better than yesterday.    Objective: Vital signs in last 24 hours: Temp:  [98.2 F (36.8 C)-98.5 F (36.9 C)] 98.4 F (36.9 C) (09/19 0818) Pulse Rate:  [79-109] 79 (09/19 0818) Resp:  [15-22] 21 (09/19 0818) BP: (75-101)/(33-50) 91/45 (09/19 0818) SpO2:  [90 %-96 %] 96 % (09/19 0818) Weight:  [122.6 kg (270 lb 4.5 oz)] 122.6 kg (270 lb 4.5 oz) (09/19 0238)  Intake/Output from previous day: 09/18 0701 - 09/19 0700 In: 1640.8 [P.O.:720; I.V.:420.8; IV Piggyback:500] Out: 75 [Urine:75] Intake/Output this shift: No intake/output data recorded.   Recent Labs  05/14/16 0402 05/15/16 0302 05/16/16 0156  HGB 11.2* 10.2* 9.4*    Recent Labs  05/15/16 0302 05/16/16 0156  WBC 13.6* 11.6*  RBC 3.72* 3.43*  HCT 33.4* 30.7*  PLT 230 217    Recent Labs  05/16/16 0156  NA 135  K 3.9  CL 104  CO2 21*  BUN 33*  CREATININE 1.30*  GLUCOSE 125*  CALCIUM 8.0*    Recent Labs  05/16/16 0156  INR 2.06    Dorsiflexion/Plantar flexion intact Incision: dressing C/D/I  Alert orientedx3   Assessment/Plan: 4 Days Post-Op Procedure(s) (LRB): TOTAL KNEE ARTHROPLASTY (Right)   Hospital acquired pneumonia with sepsis and hypotension  WBAT RLE Eliquis DVT proph Pain control as ordered dsg change prn Iv abx per med team  Appreciate medicine assistance in pts care  Youcef Klas, Ivin BootyJoshua 05/16/2016, 8:45 AM

## 2016-05-16 NOTE — Progress Notes (Signed)
Pharmacy Antibiotic Note  Tamara [name] is a 75 y.o. female s/p TKA 9/16, now with cough/hypotension, possible PNA.  Pharmacy has been consulted for Vancomycin  dosing.  Plan: Vancomycin 1500 mg IV now, then Vancomycin 750 mg IV q24h F/U renal function   Height: 5\' 2"  (157.5 cm) Weight: 255 lb (115.7 kg) IBW/kg (Calculated) : 50.1  Temp (24hrs), Avg:98.3 F (36.8 C), Min:98.2 F (36.8 C), Max:98.3 F (36.8 C)   Recent Labs Lab 05/13/16 0340 05/14/16 0402 05/15/16 0302  WBC 11.0* 15.9* 13.6*  CREATININE 1.13*  --   --     Estimated Creatinine Clearance: 51.8 mL/min (by C-G formula based on SCr of 1.13 mg/dL (H)).    Allergies  Allergen Reactions  . Codeine Nausea And Vomiting    Eddie Candlebbott, Dala Breault [place] 05/16/2016 2:10 AM

## 2016-05-16 NOTE — Progress Notes (Deleted)
Pt positive for Rhinovirus/Enterovirus.

## 2016-05-16 NOTE — Progress Notes (Signed)
Call received per floor RN at 0050 regarding Pt with low BP since 1600 this afternoon. 1 Liter bolus ordered and completed about an hour ago with BP remaining low despite fluids. Per floor RN Pt reported lethargic but arousable. Advised RN to start additional bolus and page Primary provider while RRT en route to bedside. Suggested lactic acid and sepsis order set. Upon my arrival fluids infusing and Hospitalist MD Dr. Julian ReilGardner at bedside. Pt found resting in bed on CPAP mask alert follows commands, lungs clear, denies pain. VS HR 82 BP 92/48 po2 93% on 4 L via cpap mask EKG obtained and reviewed per MD. Orders placed for sepsis work up including labs. SDU bed obtained on 2C. Report provided per Arleta CreekHuey RN to 2 C RN Marchelle FolksAmanda. Pt moved to 2 C  12.

## 2016-05-16 NOTE — Progress Notes (Signed)
Consult follow up  PROGRESS NOTE                                                                                                                                                                                                             Patient Demographics:    Tamara [name], is a 75 y.o. female, DOB - 1941/05/05, ZOX:[id number]  Admit date - 05/12/2016   Admitting Physician Frederico Hamman, MD  Outpatient Primary MD for the patient is Alan Mulder, MD  LOS - 4    No chief complaint on file.      Brief Narrative   Tamara [name] is an 75 y.o. female admitted to hospital 4 days ago for TKA.  She developed cough and congestion over the past day or so while in hospital.  Cough is productive.  She has also developed a new oxygen requirement, sating 90% on 2L CPAP.  Mild tachycardia to the low 100s and hypotension to the 80s systolic.  Tachycardia and hypotension are improving with ongoing IVF.  Ortho has ordered 2L bolus thus far which has finished.  CXR demonstrates RLL PNA   Subjective:    Tamara [name] today has, No headache, No chest pain, No abdominal pain - Complaints of shortness of breath, cough, generalized weakness  Assessment  & Plan :    Principal Problem:   HAP (hospital-acquired pneumonia) Active Problems:   Primary localized osteoarthritis of right knee   Sepsis associated hypotension (HCC)   Acute respiratory failure with hypoxia (HCC)  Acute hypoxic respiratory failure - This is related to HCAP, low probability for PE given patient on liquids. -Continue oxygen  as needed, encouraged to use incentive spirometry and flutter valve, continue with pulmonary toilet and Mucinex  HCAP - Initially an IV vancomycin and cefepime, will change to cefepime to Zosyn for better aspiration coverage, will consult SLP for swallow evaluation - Follow on cultures  Hypotension - Continue with IV fluids, continue to hold Lasix and Coreg    Elevated troponin - Was likely in the setting of demand ischemia from sepsis and hypotension, so far non-ACS pattern 1.04> 0.74, given EKG changes cardiology service consulted   Diabetes mellitus - Genu to hold oral hypoglycemic agent, continue with insulin sliding scale  Hypothyroidism - Continue levothyroxine  Status post right TKA - Initial treatment per primary orthopedic team  Code Status : Full  Family Communication  : Sister and daughter at bedside  Disposition Plan  : Per primary team, eventual plan to SNF  DVT Prophylaxis  : on Eliquis  Lab Results  Component Value Date   PLT 217 05/16/2016    Antibiotics  :   Anti-infectives    Start     Dose/Rate Route Frequency Ordered Stop   05/17/16 0600  vancomycin (VANCOCIN) IVPB 750 mg/150 ml premix  Status:  Discontinued     750 mg 150 mL/hr over 60 Minutes Intravenous Every 24 hours 05/16/16 0308 05/16/16 0746   05/17/16 0400  vancomycin (VANCOCIN) 1,500 mg in sodium chloride 0.9 % 500 mL IVPB     1,500 mg 250 mL/hr over 120 Minutes Intravenous Every 24 hours 05/16/16 0746     05/16/16 1200  ceFEPIme (MAXIPIME) 1 g in dextrose 5 % 50 mL IVPB  Status:  Discontinued    Comments:  Cefepime 1 g IV q12h for CrCl < 60 mL/min   1 g 100 mL/hr over 30 Minutes Intravenous 2 times daily 05/16/16 0310 05/16/16 0720   05/16/16 0800  piperacillin-tazobactam (ZOSYN) IVPB 3.375 g     3.375 g 12.5 mL/hr over 240 Minutes Intravenous Every 8 hours 05/16/16 0746     05/16/16 0230  vancomycin (VANCOCIN) 1,500 mg in sodium chloride 0.9 % 500 mL IVPB     1,500 mg 250 mL/hr over 120 Minutes Intravenous  Once 05/16/16 0212 05/16/16 0556   05/16/16 0200  ceFEPIme (MAXIPIME) 1 g in dextrose 5 % 50 mL IVPB  Status:  Discontinued     1 g 100 mL/hr over 30 Minutes Intravenous Every 8 hours 05/16/16 0113 05/16/16 0310   05/16/16 0030  doxycycline (VIBRAMYCIN) 100 mg in dextrose 5 % 250 mL IVPB  Status:  Discontinued     100 mg 125  mL/hr over 120 Minutes Intravenous Every 12 hours 05/16/16 0000 05/16/16 0112   05/12/16 1630  ceFAZolin (ANCEF) IVPB 2g/100 mL premix     2 g 200 mL/hr over 30 Minutes Intravenous Every 6 hours 05/12/16 1445 05/12/16 2349   05/12/16 1000  ceFAZolin (ANCEF) 3 g in dextrose 5 % 50 mL IVPB     3 g 130 mL/hr over 30 Minutes Intravenous To [facility] 05/11/16 0828 05/12/16 1056        Objective:   Vitals:   05/16/16 0100 05/16/16 0140 05/16/16 0238 05/16/16 0818  BP: (!) 87/43 (!) 92/48 (!) 101/50 (!) 91/45  Pulse:  82  79  Resp:  15 (!) 22 (!) 21  Temp:   98.5 F (36.9 C) 98.4 F (36.9 C)  TempSrc:   Oral Oral  SpO2:  93% 94% 96%  Weight:   122.6 kg (270 lb 4.5 oz)   Height:   5\' 2"  (1.575 m)     Wt Readings from Last 3 Encounters:  05/16/16 122.6 kg (270 lb 4.5 oz)  05/02/16 115.8 kg (255 lb 4.8 oz)  03/16/16 115.2 kg (254 lb)     Intake/Output Summary (Last 24 hours) at 05/16/16 1121 Last data filed at 05/16/16 1050  Gross per 24 hour  Intake          1950.83 ml  Output              425 ml  Net          1525.83 ml     Physical Exam  Awake Alert, Oriented X 3,  Supple Neck,No JVD,  Symmetrical Chest wall movement, Good air movement bilaterally, no wheezing RRR,No Gallops,Rubs or new Murmurs, No Parasternal Heave +ve B.Sounds, Abd Soft, No tenderness,  No Cyanosis, Clubbing or edema, Right knee bandaged    Data Review:    CBC  Recent Labs Lab 05/13/16 0340 05/14/16 0402 05/15/16 0302 05/16/16 0156  WBC 11.0* 15.9* 13.6* 11.6*  HGB 11.1* 11.2* 10.2* 9.4*  HCT 37.3 36.6 33.4* 30.7*  PLT 238 233 230 217  MCV 90.8 89.5 89.8 89.5  MCH 27.0 27.4 27.4 27.4  MCHC 29.8* 30.6 30.5 30.6  RDW 15.5 15.3 15.2 15.3    Chemistries   Recent Labs Lab 05/13/16 0340 05/16/16 0156  NA 137 135  K 4.8 3.9  CL 103 104  CO2 23 21*  GLUCOSE 130* 125*  BUN 17 33*  CREATININE 1.13* 1.30*  CALCIUM 8.9 8.0*    ------------------------------------------------------------------------------------------------------------------ No results for input(s): CHOL, HDL, LDLCALC, TRIG, CHOLHDL, LDLDIRECT in the last 72 hours.  No results found for: HGBA1C ------------------------------------------------------------------------------------------------------------------ No results for input(s): TSH, T4TOTAL, T3FREE, THYROIDAB in the last 72 hours.  Invalid input(s): FREET3 ------------------------------------------------------------------------------------------------------------------ No results for input(s): VITAMINB12, FOLATE, FERRITIN, TIBC, IRON, RETICCTPCT in the last 72 hours.  Coagulation profile  Recent Labs Lab 05/16/16 0156  INR 2.06    No results for input(s): DDIMER in the last 72 hours.  Cardiac Enzymes  Recent Labs Lab 05/16/16 0203 05/16/16 0741  TROPONINI 1.04* 0.72*   ------------------------------------------------------------------------------------------------------------------ No results found for: BNP  Inpatient Medications  Scheduled Meds: . apixaban  2.5 mg Oral Q12H  . docusate sodium  100 mg Oral QHS  . guaiFENesin  1,200 mg Oral BID  . insulin aspart  0-20 Units Subcutaneous TID WC  . insulin aspart  0-5 Units Subcutaneous QHS  . insulin aspart  6 Units Subcutaneous TID WC  . levothyroxine  150 mcg Oral QAC breakfast  . piperacillin-tazobactam (ZOSYN)  IV  3.375 g Intravenous Q8H  . [START ON 05/17/2016] vancomycin  1,500 mg Intravenous Q24H   Continuous Infusions: . sodium chloride 125 mL/hr at 05/16/16 0238   PRN Meds:.acetaminophen **OR** acetaminophen, bisacodyl, HYDROcodone-acetaminophen, menthol-cetylpyridinium **OR** phenol, metoCLOPramide **OR** metoCLOPramide (REGLAN) injection, nitroGLYCERIN, ondansetron **OR** ondansetron (ZOFRAN) IV, promethazine, senna-docusate, sodium phosphate, traMADol  Micro Results Recent Results (from the past 240  hour(s))  Culture, blood (routine x 2) Call MD if unable to obtain prior to antibiotics being given     Status: None (Preliminary result)   Collection Time: 05/16/16  1:36 AM  Result Value Ref Range Status   Specimen Description BLOOD RIGHT HAND  Final   Special Requests IN PEDIATRIC BOTTLE 2CC  Final   Culture NO GROWTH < 12 HOURS  Final   Report Status PENDING  Incomplete  Culture, blood (routine x 2) Call MD if unable to obtain prior to antibiotics being given     Status: None (Preliminary result)   Collection Time: 05/16/16  1:40 AM  Result Value Ref Range Status   Specimen Description BLOOD LEFT ANTECUBITAL  Final   Special Requests BOTTLES DRAWN AEROBIC AND ANAEROBIC 5CC  Final   Culture NO GROWTH < 12 HOURS  Final   Report Status PENDING  Incomplete    Radiology Reports Dg Chest 2 View  Result Date: 05/02/2016 CLINICAL DATA:  Preop for knee replacement. EXAM: CHEST  2 VIEW COMPARISON:  11/20/2015 FINDINGS: Linear areas of scarring in the mid and lower lungs bilaterally. Heart is normal size. No confluent opacities or effusions. No acute bony abnormality. IMPRESSION:  Areas of scarring in the lungs bilaterally.  No active disease. Electronically Signed   By: Charlett Nose M.D.   On: 05/02/2016 12:32   Dg Chest Port 1 View  Result Date: 05/15/2016 CLINICAL DATA:  Decreased oxygen saturation. Low blood pressure. History of CHF, hypertension, asthma. EXAM: PORTABLE CHEST 1 VIEW COMPARISON:  05/02/2016 FINDINGS: Increasing linear infiltrates and volume loss in the right mid and lower lungs likely represent developing area of pneumonia. Normal heart size and pulmonary vascularity. No blunting of costophrenic angles. No pneumothorax. Calcification of the aorta. Degenerative changes in the spine and shoulders. IMPRESSION: Increasing linear infiltrates and volume loss in the right mid and lower lungs may represent developing area of pneumonia. Electronically Signed   By: Burman Nieves M.D.    On: 05/15/2016 22:16    No charge  Randol Kern, Eunice Winecoff M.D on 05/16/2016 at 11:21 AM  Between 7am to 7pm - Pager - [phone]  After 7pm go to [email or web address] - password [facility]  [facility] -  Office  [phone]

## 2016-05-16 NOTE — Consult Note (Addendum)
Patient ID: Tamara [name] MRN: [id number], DOB/AGE: 75-Mar-1942   Admit date: 05/12/2016   Reason for Consult: Abnormal EKG and + troponin  Requesting MD: Dr. Randol KernElgergawy, Internal Medicine    Primary Physician: Alan MulderMORAYATI,SHAMIL J, MD Primary Cardiologist: Dr. Rennis GoldenHilty   Pt. Profile:  75 y/o female with borderline cardiomegaly, dyspnea, palpitations and documented SVT, hypothyroidism, type 2 diabetes, borderline dyslipidemia and recent negative NST 02/2016, conducted as part of per-operative assessment prior to knee surgery. She is day 4 s/p right TKR. Post op recovery complicated by sepsis in the setting of RLL PNA. Cardiology consulted for abnormal EKG and an elevated troponin of 1.04.   Problem List  Past Medical History:  Diagnosis Date  . Anemia    distant hx of  . Arthritis    KNEES,HANDS,WRIST  . Arthritis    KNEE PAIN, BONE ON BONE  . Asthma    allergy related  . 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 Bronchitis   . Cardiomegaly   . CHF (congestive heart failure) (HCC)    stopped using diuretics due to kidney issues, watching salt intake  . Chronic cough   . Chronic kidney disease    due to DM  . Complication of anesthesia    BP bottomed out during Achilles Tendon repair >733yrs ago  . Constipation due to pain medication   . Diabetes mellitus without complication (HCC)    type 2  . Diverticulitis   . Gout   . Hepatitis    childhood Hep A 75 yrs old  . History of blood transfusion   . Hypertension   . Hypothyroidism   . 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 Obesity   . Palpitations   . Pneumonia    hx of- 05/02/16- last time approx - 6 years ago  . Shortness of breath dyspnea    with exertion  . SVT (supraventricular tachycardia) (HCC)   . Wheezing     Past Surgical History:  Procedure Laterality Date  . ABDOMINAL HYSTERECTOMY    . ABDOMINAL HYSTERECTOMY  1984  . ACHILLES TENDON REPAIR Left   . CARDIAC  CATHETERIZATION     [place]  . CATARACT EXTRACTION W/PHACO Left 02/10/2015   Procedure: CATARACT EXTRACTION PHACO AND INTRAOCULAR LENS PLACEMENT (IOC);  Surgeon: Lockie Molahadwick Brasington, MD;  [facility];  Service: Ophthalmology;  Laterality: Left;  DIABETIC  . CATARACT EXTRACTION W/PHACO Right 02/24/2015   Procedure: CATARACT EXTRACTION PHACO AND INTRAOCULAR LENS PLACEMENT (IOC);  Surgeon: Lockie Molahadwick Brasington, MD;  [facility];  Service: Ophthalmology;  Laterality: Right;  . CESAREAN SECTION  1978  . CHOLECYSTECTOMY  1986  . EYE SURGERY Bilateral    Cataract   . TOTAL KNEE ARTHROPLASTY Right 05/12/2016   Procedure: TOTAL KNEE ARTHROPLASTY;  Surgeon: Frederico Hammananiel Caffrey, MD;  [facility];  Service: Orthopedics;  Laterality: Right;     Allergies  Allergies  Allergen Reactions  . Codeine Nausea And Vomiting    HPI  75 y.o. female with a history of borderline cardiomegaly, dyspnea, palpitations and documented SVT, hypothyroidism, type 2 diabetes and borderline dyslipidemia. In March 2017 she presented to the ED with complaint of CP. She did not require admission. EKG in the ED at that time was nonischemic and cardiac enzymes were negative. She was instructed to f/u with her PCP. No stress testing was performed around that time.  In July, she was referred to  Dr. Rennis Golden, by Dr. Madelon Lips, for pre-operative assessment prior to knee surgery. Dr. Rennis Golden ordered for her to undergo a Lexiscan NST. This was performed in our office 03/17/16. This was a normal nuclear study w/ no ischemia or infarction. EF was 62% with normal wall motion. She was cleared for surgery. Her surgery was performed 05/12/16 by Dr. Madelon Lips. She underwent a right TKR for severe OA. She was placed on Eliquis for DVT prophylaxis.  Plan was for discharge from the hospital on 9/18 with plans to go to a SNF for continued rehab. However, day of d/c, she complained new development of a productive cough and  congestion. This was accompanied by increased O2 demands, sating 90% on 2L CPAP. She was also tachycardic with rate in the low 100s and hypotensive with SBP in the 80s. She was given IVFs and condition improved. CXR demonstrated RLL PNA. Internal medicine was consulted and she was placed on IV antibiotics. An EKG was also obtained which demonstrated new T wave inversions in the lateral leads. Subsequently, troponin was checked and notably abnormal at 1.04. 2nd troponin down trending at 0.72. Cardiology consulted.   She denies any recent CP. Her cough and dyspnea both improving.    Home Medications  Prior to Admission medications   Medication Sig Start Date End Date Taking? Authorizing Provider  acetaminophen (TYLENOL) 500 MG tablet Take 1,000 mg by mouth at bedtime.    Yes Historical Provider, MD  aspirin EC 81 MG tablet Take 81 mg by mouth daily.   Yes Historical Provider, MD  canagliflozin (INVOKANA) 300 MG TABS tablet Take 300 mg by mouth daily before breakfast.   Yes Historical Provider, MD  carvedilol (COREG) 3.125 MG tablet Take 3.125 mg by mouth 2 (two) times daily with a meal.   Yes Historical Provider, MD  docusate sodium (COLACE) 100 MG capsule Take 100 mg by mouth at bedtime.   Yes Historical Provider, MD  furosemide (LASIX) 20 MG tablet Take 20 mg by mouth daily.   Yes Historical Provider, MD  glipiZIDE (GLUCOTROL XL) 2.5 MG 24 hr tablet Take 2.5 mg by mouth daily with breakfast.    Yes Historical Provider, MD  ibuprofen (ADVIL,MOTRIN) 200 MG tablet Take 400 mg by mouth every morning. pm    Yes Historical Provider, MD  levothyroxine (SYNTHROID, LEVOTHROID) 150 MCG tablet Take 150 mcg by mouth daily before breakfast.   Yes Historical Provider, MD  traMADol (ULTRAM) 50 MG tablet Take 1 tablet by mouth every 8 (eight) hours as needed. 02/08/16  Yes Historical Provider, MD  apixaban (ELIQUIS) 2.5 MG TABS tablet Take 1 tablet (2.5 mg total) by mouth 2 (two) times daily. 05/12/16   Margart Sickles, PA-C  HYDROcodone-acetaminophen (NORCO/VICODIN) 5-325 MG tablet Take 1 tablet by mouth every 4 (four) hours as needed for moderate pain or severe pain. 05/15/16   Margart Sickles, PA-C  nitroGLYCERIN (NITROSTAT) 0.4 MG SL tablet Place 0.4 mg under the tongue every 5 (five) minutes as needed for chest pain. Max 3    Historical Provider, MD  ondansetron (ZOFRAN) 4 MG tablet Take 1 tablet (4 mg total) by mouth every 6 (six) hours as needed for nausea. 05/15/16   Joshua Chadwell, PA-C  ONE TOUCH ULTRA TEST test strip Apply 1 strip topically as directed. 02/06/16   Historical Provider, MD  oxyCODONE (ROXICODONE) 5 MG immediate release tablet 1-2 tabs po q4-6hrs prn pain 05/12/16   Margart Sickles, PA-C  traMADol (ULTRAM) 50 MG tablet Take 2 tablets (100  mg total) by mouth every 6 (six) hours as needed. 05/12/16   Margart Sickles, [facility] Meds . apixaban  2.5 mg Oral Q12H  . docusate sodium  100 mg Oral QHS  . guaiFENesin  1,200 mg Oral BID  . insulin aspart  0-20 Units Subcutaneous TID WC  . insulin aspart  0-5 Units Subcutaneous QHS  . insulin aspart  6 Units Subcutaneous TID WC  . levothyroxine  150 mcg Oral QAC breakfast  . piperacillin-tazobactam (ZOSYN)  IV  3.375 g Intravenous Q8H  . [START ON 05/17/2016] vancomycin  1,500 mg Intravenous Q24H    Family History  Family History  Problem Relation Age of Onset  . Lung cancer Mother   . Heart attack Mother   . Heart Problems Father   . Hypertension Father   . Other Father     respiratory problems  . Ovarian cancer Maternal Grandmother   . Cancer Maternal Grandfather   . Stroke Paternal Grandmother   . Diabetes Brother   . Liver cancer Brother   . COPD Sister   . Other Sister     sepsis  . Heart attack Son   . Heart attack Son   . Rheum arthritis Daughter   . Rheum arthritis Daughter   . Thyroid cancer Daughter   . Hepatitis C Daughter     Social History  Social History   Social History  . Marital status:  Divorced    Spouse name: N/A  . Number of children: 7  . Years of education: 19   Occupational History  . school bus driver [employer]   Social History Main Topics  . Smoking status: Never Smoker  . Smokeless tobacco: Former Neurosurgeon  . Alcohol use No  . Drug use: No  . Sexual activity: Not on file   Other Topics Concern  . Not on file   Social History Narrative   epworth sleepiness scale = 15 (03/01/16)   exercises 3-4 times/weekly - 1-1.5 hours in the pool     Review of Systems General:  No chills, fever, night sweats or weight changes.  Cardiovascular:  No chest pain, dyspnea on exertion, edema, orthopnea, palpitations, paroxysmal nocturnal dyspnea. Dermatological: No rash, lesions/masses Respiratory: No cough, dyspnea Urologic: No hematuria, dysuria Abdominal:   No nausea, vomiting, diarrhea, bright red blood per rectum, melena, or hematemesis Neurologic:  No visual changes, wkns, changes in mental status. All other systems reviewed and are otherwise negative except as noted above.  Physical Exam  Blood pressure (!) 95/50, pulse 81, temperature 98.4 F (36.9 C), temperature source Oral, resp. rate 18, height 5\' 2"  (1.575 m), weight 270 lb 4.5 oz (122.6 kg), SpO2 98 %.  General: Pleasant, NAD, obese  Psych: Normal affect. Neuro: Alert and oriented X 3. Moves all extremities spontaneously. HEENT: Normal  Neck: Supple without bruits or JVD. Lungs:  Resp regular and unlabored, CTA. Heart: RRR no s3, s4, or murmurs. Abdomen: Soft, non-tender, non-distended, BS + x 4.  Extremities: No edema. DP/PT/Radials 2+ and equal bilaterally. S/p right TKR.   Labs  Troponin (Point of Care Test) No results for input(s): TROPIPOC in the last 72 hours.  Recent Labs  05/16/16 0203 05/16/16 0741  TROPONINI 1.04* 0.72*   Lab Results  Component Value Date   WBC 11.6 (H) 05/16/2016   HGB 9.4 (L) 05/16/2016   HCT 30.7 (L) 05/16/2016   MCV 89.5 05/16/2016   PLT 217  05/16/2016    Recent Labs  Lab 05/16/16 0156  NA 135  K 3.9  CL 104  CO2 21*  BUN 33*  CREATININE 1.30*  CALCIUM 8.0*  GLUCOSE 125*   No results found for: CHOL, HDL, LDLCALC, TRIG No results found for: DDIMER   Radiology/Studies  Dg Chest 2 View  Result Date: 05/02/2016 CLINICAL DATA:  Preop for knee replacement. EXAM: CHEST  2 VIEW COMPARISON:  11/20/2015 FINDINGS: Linear areas of scarring in the mid and lower lungs bilaterally. Heart is normal size. No confluent opacities or effusions. No acute bony abnormality. IMPRESSION: Areas of scarring in the lungs bilaterally.  No active disease. Electronically Signed   By: Charlett Nose M.D.   On: 05/02/2016 12:32   Dg Chest Port 1 View  Result Date: 05/15/2016 CLINICAL DATA:  Decreased oxygen saturation. Low blood pressure. History of CHF, hypertension, asthma. EXAM: PORTABLE CHEST 1 VIEW COMPARISON:  05/02/2016 FINDINGS: Increasing linear infiltrates and volume loss in the right mid and lower lungs likely represent developing area of pneumonia. Normal heart size and pulmonary vascularity. No blunting of costophrenic angles. No pneumothorax. Calcification of the aorta. Degenerative changes in the spine and shoulders. IMPRESSION: Increasing linear infiltrates and volume loss in the right mid and lower lungs may represent developing area of pneumonia. Electronically Signed   By: Burman Nieves M.D.   On: 05/15/2016 22:16    ECG  NSR, RBBB, New Anterolateral TWIs    ASSESSMENT AND PLAN  Principal Problem:   HAP (hospital-acquired pneumonia) Active Problems:   Primary localized osteoarthritis of right knee   Sepsis associated hypotension (HCC)   Acute respiratory failure with hypoxia (HCC)   1. Abnormal EKG + Elevated Troponin: patient denies any recent CP. She had a NST 1 month ago, as part of pre-operative assessment, that was negative for ischemia and infarction. EF estimate was normal at 62%. ? If demand ischemia in the setting  of recent surgery, perioperative anemia (Hgb 12>>9) and PNA. Recommend 2D echo to assess LVF and wall motion. We will check a 3rd troponin to ensure that troponin level is still down trending. Will discuss with MD possibility of definitive LHC.   2. PNA: CXR shows right sided PNA. Continue antibiotics per IM.  3. Right Knee OA: s/p right TKR. Continue management per Ortho. Currently NWB. On Eliquis for DVT prophylaxis.   4. Perioperative Anemia: Drop in Hgb from 12.9, pre surgery, to 9.4 post surgery. Continue to monitor.     Signed, Robbie Lis, PA-C 05/16/2016, 2:11 PM  Patient seen and examined. Agree with assessment and plan.  Tamara [name] is a 75 year old Caucasian female who has a history of type 2 diabetes mellitus, hypothyroidism, SVT, and has undergone recent right total knee replacement surgery.  She had undergone a preoperative cardiology assessment including a nuclear stress test which was done in July 2017.  This revealed normal perfusion without scar or ischemia.  She underwent right total knee replacement on 05/12/2016 by Dr. Madelon Lips and has been on eliquis for DVT prophylaxis.  Plans were for hospital discharge yesterday, but she began to develop a productive cough and congestion associated with oxygen desaturation requiring supplemental oxygen.  She became tachycardic and hypotensive with blood pressure in the 80s and was treated with IV fluids.  A chest x-ray suggested right lower lobe pneumonia and she was started on antibiotic therapy initially was so sitting and vancomycin.  Her ECG now reveals new progressive T-wave changes across the precordium whereas previously she had mild nondiagnostic T changes in leads  V1 through V3 with an incomplete right bundle branch block pattern.  Her troponin is mildly positive at 1.04.  She is without chest pain.  She does admit to mild shortness of breath.  On exam, her blood pressure is 97/60, pulse in the 80s.  Nadolol had been held.  She has  a thick neck.  There is no JVD.  HEENT was unremarkable.  There was slightly decreased breath sounds.  Rhythm was regular with faint 1/6 systolic murmur.  Abdomen was soft, nontender but obese.  She status post right total knee replacement which was in a brace.  Neurologic exam was grossly nonfocal.  I long discussion with the patient and her family.  She recently had a normal perfusion study which I personally reviewed.  I suspect her most recent decompensation is contributed by her postoperative anemia, respiratory distress resulting from her pneumonia leading to tachycardia and transient hypotension.  A 2-D echo Doppler study will be scheduled.  Although her positive troponin may be consistent with demand ischemia serial enzymes will be obtained. Her ECG changes are worrisome for LAD ischemia.  I am recommending definitive cardiac catheterization be performed following discontinuance of eliquis for at least 48 hours.  Heparin will be instituted and as long as she remains stable, will tentatively plan definitive cardiac catheterization on Thursday, 05/18/2016   Lennette Bihari, MD, [facility] 05/16/2016 4:31 PM

## 2016-05-16 NOTE — Progress Notes (Signed)
Orthopedic Tech Progress Note Patient Details:  Tamara [name] March 20, 1941 [id number]  Patient ID: Tamara [name], female   DOB: March 20, 1941, 75 y.o.   MRN: [id number] Removed cpm. rn turned cpm off at 0 degrees around 0616  [name], Malic Rosten J 05/16/2016, 7:35 AM

## 2016-05-16 NOTE — Progress Notes (Signed)
Physical Therapy Treatment Patient Details Name: Tamara [name] MRN: [id number] DOB: Jan 15, 1941 Today's Date: 05/16/2016    History of Present Illness 75 yo female with Right knee OA, s/p TKA on 05/12/16.  Resp issues on 9/18 and transfer to ICU.    PT Comments    Pt admitted with above diagnosis. Pt currently with functional limitations due to balance and endurance deficits. Pt was able to stand pivot with +2 mod to max assist.  Dizzy with BP slightly low.  Stabilized once inchair.   Pt will benefit from skilled PT to increase their independence and safety with mobility to allow discharge to the venue listed below.    Follow Up Recommendations  SNF;Supervision/Assistance - 24 hour     Equipment Recommendations  None recommended by PT    Recommendations for Other Services       Precautions / Restrictions Precautions Precautions: Fall;Knee Precaution Booklet Issued: Yes (comment) Precaution Comments:  reviewed precautions and no pillow under knee. Required Braces or Orthoses: Knee Immobilizer - Right Knee Immobilizer - Right: On when out of bed or walking Restrictions Weight Bearing Restrictions: Yes RLE Weight Bearing: Weight bearing as tolerated    Mobility  Bed Mobility Overal bed mobility: Needs Assistance Bed Mobility: Supine to Sit Rolling: Min assist   Supine to sit: +2 for physical assistance;Mod assist     General bed mobility comments: Used pad to scoot pt to EOB.  Mod assist overall.  Transfers Overall transfer level: Needs assistance Equipment used: Rolling walker (2 wheeled) Transfers: Sit to/from [employer]GI CorporationStand;Stand Pivot Transfers Sit to Stand: Max assist Stand pivot transfers: Mod assist;+2 safety/equipment       General transfer comment: Increased time to stabilize in standing; cues for hand placement and technique;assist to power up and to extend hips for upright posture and for hand placement on RW. SPT to chair with Mod A to help with weight shifting,  and RW management.Pt maintains flexed posture.   Ambulation/Gait                 Stairs            Wheelchair Mobility    Modified Rankin (Stroke Patients Only)       Balance Overall balance assessment: Needs assistance Sitting-balance support: No upper extremity supported;Feet supported Sitting balance-Leahy Scale: Fair     Standing balance support: Bilateral upper extremity supported;During functional activity Standing balance-Leahy Scale: Poor Standing balance comment: reliant on RW for balance.  could not stand fully upright.  Really needs mod +2 assist.                     Cognition Arousal/Alertness: Awake/alert Behavior During Therapy: WFL for tasks assessed/performed Overall Cognitive Status: Within Functional Limits for tasks assessed                      Exercises Total Joint Exercises Ankle Circles/Pumps: Both;10 reps;Supine Quad Sets: Both;10 reps;Supine Goniometric ROM: 5-60 Right knee    General Comments        Pertinent Vitals/Pain Pain Assessment: 0-10 Pain Score: 6  Pain Location: right knee Pain Descriptors / Indicators: Aching;Sore Pain Intervention(s): Limited activity within patient's tolerance;Monitored during session;Premedicated before session;Repositioned  BP on arrival 89/53.  76/55 in sitting.  Dizzy with transfer but BP once in chair was 103/52.  93% on 2LO2.  HR 85 bpm.     Home Living  Prior Function            PT Goals (current goals can now be found in the care plan section) Progress towards PT goals: Progressing toward goals (slowly)    Frequency    7X/week      PT Plan Current plan remains appropriate    Co-evaluation             End of Session Equipment Utilized During Treatment: Gait belt;Oxygen;Right knee immobilizer Activity Tolerance: Patient limited by lethargy;Patient tolerated treatment well Patient left: in chair;with call bell/phone within  reach;with family/visitor present     Time: 4098-1191 PT Time Calculation (min) (ACUTE ONLY): 30 min  Charges:  $Therapeutic Exercise: 8-22 mins $Therapeutic Activity: 8-22 mins                    G CodesTawni Millers F 2016/06/07, 11:46 AM [facility] [phone] (pager)

## 2016-05-17 ENCOUNTER — Encounter (HOSPITAL_COMMUNITY): Payer: Self-pay | Admitting: Cardiology

## 2016-05-17 ENCOUNTER — Inpatient Hospital Stay (HOSPITAL_COMMUNITY): Payer: Medicare Other

## 2016-05-17 DIAGNOSIS — R9431 Abnormal electrocardiogram [ECG] [EKG]: Secondary | ICD-10-CM

## 2016-05-17 LAB — BASIC METABOLIC PANEL
ANION GAP: 7 (ref 5–15)
BUN: 23 mg/dL — ABNORMAL HIGH (ref 6–20)
CO2: 21 mmol/L — ABNORMAL LOW (ref 22–32)
Calcium: 7.8 mg/dL — ABNORMAL LOW (ref 8.9–10.3)
Chloride: 110 mmol/L (ref 101–111)
Creatinine, Ser: 1.04 mg/dL — ABNORMAL HIGH (ref 0.44–1.00)
GFR calc Af Amer: 59 mL/min — ABNORMAL LOW (ref >60)
GFR, EST NON AFRICAN AMERICAN: 51 mL/min — AB (ref >60)
GLUCOSE: 105 mg/dL — AB (ref 65–99)
POTASSIUM: 3.6 mmol/L (ref 3.5–5.1)
Sodium: 138 mmol/L (ref 135–145)

## 2016-05-17 LAB — GLUCOSE, CAPILLARY
GLUCOSE-CAPILLARY: 132 mg/dL — AB (ref 65–99)
GLUCOSE-CAPILLARY: 118 mg/dL — AB (ref 65–99)
Glucose-Capillary: 93 mg/dL (ref 65–99)
Glucose-Capillary: 101 mg/dL — ABNORMAL HIGH (ref 65–99)

## 2016-05-17 LAB — CBC
HEMATOCRIT: 28.6 % — AB (ref 36.0–46.0)
HEMOGLOBIN: 8.6 g/dL — AB (ref 12.0–15.0)
MCH: 27 pg (ref 26.0–34.0)
MCHC: 30.1 g/dL (ref 30.0–36.0)
MCV: 89.9 fL (ref 78.0–100.0)
Platelets: 207 10*3/uL (ref 150–400)
RBC: 3.18 MIL/uL — ABNORMAL LOW (ref 3.87–5.11)
RDW: 15.7 % — ABNORMAL HIGH (ref 11.5–15.5)
WBC: 8.3 10*3/uL (ref 4.0–10.5)

## 2016-05-17 LAB — ECHOCARDIOGRAM COMPLETE
Height: 62 in
Weight: 4324.54 oz

## 2016-05-17 LAB — PREPARE RBC (CROSSMATCH)

## 2016-05-17 LAB — TROPONIN I: TROPONIN I: 0.3 ng/mL — AB (ref <0.03)

## 2016-05-17 MED ORDER — ENOXAPARIN SODIUM 40 MG/0.4ML ~~LOC~~ SOLN
40.0000 mg | Freq: Once | SUBCUTANEOUS | Status: AC
  Administered 2016-05-17: 40 mg via SUBCUTANEOUS
  Filled 2016-05-17: qty 0.4

## 2016-05-17 MED ORDER — SODIUM CHLORIDE 0.9 % IV SOLN
Freq: Once | INTRAVENOUS | Status: AC
  Administered 2016-05-17: 08:00:00 via INTRAVENOUS

## 2016-05-17 MED ORDER — POLYETHYLENE GLYCOL 3350 17 G PO PACK
17.0000 g | PACK | Freq: Every day | ORAL | Status: DC
  Administered 2016-05-17 – 2016-05-20 (×3): 17 g via ORAL
  Filled 2016-05-17 (×3): qty 1

## 2016-05-17 MED ORDER — ORAL CARE MOUTH RINSE
15.0000 mL | Freq: Two times a day (BID) | OROMUCOSAL | Status: DC
  Administered 2016-05-17 – 2016-05-20 (×5): 15 mL via OROMUCOSAL

## 2016-05-17 MED ORDER — ASPIRIN 81 MG PO CHEW
81.0000 mg | CHEWABLE_TABLET | ORAL | Status: AC
  Administered 2016-05-18: 81 mg via ORAL
  Filled 2016-05-17: qty 1

## 2016-05-17 MED ORDER — SODIUM CHLORIDE 0.9% FLUSH: 3.0000 mL | INTRAVENOUS | Status: DC | PRN

## 2016-05-17 MED ORDER — SODIUM CHLORIDE 0.9% FLUSH
3.0000 mL | Freq: Two times a day (BID) | INTRAVENOUS | Status: DC
  Administered 2016-05-17 – 2016-05-18 (×2): 3 mL via INTRAVENOUS

## 2016-05-17 MED ORDER — SODIUM CHLORIDE 0.9 % IV SOLN: 250.0000 mL | INTRAVENOUS | Status: DC | PRN

## 2016-05-17 MED ORDER — SODIUM CHLORIDE 0.9 % WEIGHT BASED INFUSION
3.0000 mL/kg/h | INTRAVENOUS | Status: DC
  Administered 2016-05-18: 3 mL/kg/h via INTRAVENOUS

## 2016-05-17 MED ORDER — SODIUM CHLORIDE 0.9 % WEIGHT BASED INFUSION: 1.0000 mL/kg/h | INTRAVENOUS | Status: DC

## 2016-05-17 NOTE — Progress Notes (Signed)
Physical Therapy Treatment Patient Details Name: Tamara [name] MRN: [id number] DOB: July 14, 1941 Today's Date: 05/17/2016    History of Present Illness 75 yo female with Right knee OA, s/p TKA on 05/12/16.  Resp issues on 9/18 and transfer to ICU.    PT Comments    Pt presented supine in bed with HOB elevated, awake and willing to participate in therapy session. Pt's daughter was present throughout session as well. Pt making good progress towards achieving her goals and participated in gait training to ambulate approximately 10 ft during session. When on RA pt with decrease in SPO2 to 87%, but quickly recovered to 92-98% when 3 L O2 via [place] replaced. Pt would continue to benefit from skilled physical therapy services at this time while admitted and after d/c to address her limitations in order to improve her overall safety and independence with functional mobility.   Follow Up Recommendations  SNF;Supervision/Assistance - 24 hour     Equipment Recommendations  None recommended by PT    Recommendations for Other Services       Precautions / Restrictions Precautions Precautions: Fall;Knee Precaution Booklet Issued: Yes (comment) Precaution Comments:  reviewed precautions and no pillow under knee. Required Braces or Orthoses: Knee Immobilizer - Right Knee Immobilizer - Right: On when out of bed or walking Restrictions Weight Bearing Restrictions: Yes RLE Weight Bearing: Weight bearing as tolerated    Mobility  Bed Mobility Overal bed mobility: Needs Assistance Bed Mobility: Supine to Sit     Supine to sit: Mod assist;+2 for safety/equipment;HOB elevated     General bed mobility comments: Used pad to scoot pt to EOB.  Mod assist overall.  Transfers Overall transfer level: Needs assistance Equipment used: Rolling walker (2 wheeled) Transfers: Sit to/from [employer] Sit to Stand: Max assist Stand pivot transfers: Max assist;+2 safety/equipment        General transfer comment: Increased time to stabilize in standing; cues for hand placement and technique;assist to power up and to extend hips for upright posture and for hand placement on RW. SPT to chair with Mod A to help with weight shifting, and RW management.Pt maintains flexed posture.   Ambulation/Gait Ambulation/Gait assistance: Min assist;+2 safety/equipment Ambulation Distance (Feet): 10 Feet Assistive device: Rolling walker (2 wheeled) Gait Pattern/deviations: Step-to pattern;Decreased step length - left;Decreased stance time - right;Decreased weight shift to right;Decreased step length - right;Trunk flexed Gait velocity: decreased Gait velocity interpretation: Below normal speed for age/gender General Gait Details: Pt continuing to lean forward over RW on forearms with fatigue. Pt able to advance bilateral LEs forward independently. Pt still maintaining very flexed posture.   Stairs            Wheelchair Mobility    Modified Rankin (Stroke Patients Only)       Balance Overall balance assessment: Needs assistance Sitting-balance support: Feet supported;No upper extremity supported Sitting balance-Leahy Scale: Fair     Standing balance support: During functional activity;Bilateral upper extremity supported Standing balance-Leahy Scale: Poor Standing balance comment: heavily reliant on RW                    Cognition Arousal/Alertness: Awake/alert Behavior During Therapy: WFL for tasks assessed/performed Overall Cognitive Status: Within Functional Limits for tasks assessed                      Exercises Total Joint Exercises Long Arc Quad: AAROM;Right;10 reps;Seated Knee Flexion: AAROM;Right;10 reps;Seated    General Comments  Pertinent Vitals/Pain Pain Assessment: Faces Faces Pain Scale: Hurts even more Pain Location: R knee Pain Descriptors / Indicators: Grimacing;Guarding;Sore Pain Intervention(s): Monitored during  session;Repositioned;Limited activity within patient's tolerance Removed O2 during bed mobility to achieve sitting EOB. O2 dropping to 87% on RA. Replaced O2 at 3L via [place] with vitals returning to 92-98% for the remainder of session. Initial BP: 95/47 in supine Sitting BP: 102/52 After ambulation: 132/60  After sitting 5 mins in chair: 89/49    Home Living                      Prior Function            PT Goals (current goals can now be found in the care plan section) Acute Rehab PT Goals Patient Stated Goal: not stated PT Goal Formulation: With patient Time For Goal Achievement: 05/27/16 Potential to Achieve Goals: Good Progress towards PT goals: Progressing toward goals    Frequency    7X/week      PT Plan Current plan remains appropriate    Co-evaluation             End of Session Equipment Utilized During Treatment: Gait belt;Oxygen;Right knee immobilizer Activity Tolerance: Patient limited by pain;Patient limited by fatigue Patient left: in chair;with call bell/phone within reach;with family/visitor present     Time: [id number] PT Time Calculation (min) (ACUTE ONLY): 33 min  Charges:  $Gait Training: 8-22 mins                    G Codes:      Alessandra BevelsJennifer M Creig Landin 05/17/2016, 3:17 PM Deborah ChalkJennifer Adalynne Steffensmeier, PT, DPT [phone]

## 2016-05-17 NOTE — Progress Notes (Signed)
Subjective: 5 Days Post-Op Procedure(s) (LRB): TOTAL KNEE ARTHROPLASTY (Right) Patient reports pain as mild and as long as shes not moving much..    Objective: Vital signs in last 24 hours: Temp:  [98.2 F (36.8 C)-98.6 F (37 C)] 98.6 F (37 C) (09/20 1217) Pulse Rate:  [82-88] 82 (09/20 0750) Resp:  [15-24] 23 (09/20 0750) BP: (86-100)/(38-61) 86/42 (09/20 1217) SpO2:  [90 %-100 %] 100 % (09/20 0750)  Intake/Output from previous day: 09/19 0701 - 09/20 0700 In: 2635 [P.O.:360; I.V.:2125; IV Piggyback:150] Out: 1275 [Urine:1275] Intake/Output this shift: Total I/O In: 1841.7 [I.V.:1456.7; Blood:335; IV Piggyback:50] Out: 500 [Urine:500]   Recent Labs  05/15/16 0302 05/16/16 0156 05/17/16 0427  HGB 10.2* 9.4* 8.6*    Recent Labs  05/16/16 0156 05/17/16 0427  WBC 11.6* 8.3  RBC 3.43* 3.18*  HCT 30.7* 28.6*  PLT 217 207    Recent Labs  05/16/16 0156 05/17/16 0427  NA 135 138  K 3.9 3.6  CL 104 110  CO2 21* 21*  BUN 33* 23*  CREATININE 1.30* 1.04*  GLUCOSE 125* 105*  CALCIUM 8.0* 7.8*    Recent Labs  05/16/16 0156  INR 2.06    Neurovascular intact Dorsiflexion/Plantar flexion intact Incision: dressing C/D/I  Assessment/Plan: 5 Days Post-Op Procedure(s) (LRB): TOTAL KNEE ARTHROPLASTY (Right) Up with therapy CPM as ordered Pain control as ordered. On eliquis for dvt proph I believe held for cath tomorrow per cardiology Iv abx pneumonia per IM team Transfused rbcs today for ABLA per IM team DM sliding scale insulin as ordered dispo remains in SDU, eventual plan is discharge to SNF Will discuss with IM team about taking over as primary service and we will continue to follow.    Margart SicklesChadwell, Clarinda Obi 05/17/2016, 2:51 PM

## 2016-05-17 NOTE — Progress Notes (Signed)
SLP Cancellation Note  Patient Details Name: Tamara [name] MRN: [id number] DOB: 26-Apr-1941   Cancelled treatment:       Reason Eval/Treat Not Completed: Patient at procedure or test/unavailable.   Blenda MountsCouture, Talley Kreiser Laurice 05/17/2016, 2:54 PM

## 2016-05-17 NOTE — Progress Notes (Addendum)
75 y/o female with borderline cardiomegaly, dyspnea, palpitations and documented SVT, hypothyroidism, type 2 diabetes, borderline dyslipidemia and recent negative NST 02/2016, conducted as part of per-operative assessment prior to knee surgery. She is day 4 s/p right TKR. Post op recovery complicated by sepsis in the setting of RLL PNA. Cardiology consulted for abnormal EKG and an elevated troponin of 1.04.    Subjective: No pain in chest only knee and no SOB.  Receiving 1 uPRBCs now   Objective: Vital signs in last 24 hours: Temp:  [98.2 F (36.8 C)-98.6 F (37 C)] 98.6 F (37 C) (09/20 1217) Pulse Rate:  [82-88] 82 (09/20 0750) Resp:  [15-24] 23 (09/20 0750) BP: (86-100)/(38-61) 86/42 (09/20 1217) SpO2:  [90 %-100 %] 100 % (09/20 0750) Weight change:  Last BM Date: 05/11/16 Intake/Output from previous day:  Not noted 09/19 0701 - 09/20 0700 In: 2635 [P.O.:360; I.V.:2125; IV Piggyback:150] Out: 1275 [Urine:1275] Intake/Output this shift: Total I/O In: 1491.7 [I.V.:1156.7; Blood:335] Out: 500 [Urine:500]  PE: General:Pleasant affect, NAD, eating lunch Skin:Warm and dry, brisk capillary refill HEENT:normocephalic, sclera clear, mucus membranes moist Neck:supple, no JVD  Heart:S1S2 RRR without murmur, gallup, rub or click Lungs:clear, ant without rales, rhonchi, or wheezes OZH:YQMVAbd:soft, non tender, + BS, do not palpate liver spleen or masses Ext:no lower ext edema, 2+ pedal pulses, 2+ radial pulses Neuro:alert and oriented X 3, MAE, follows commands, + facial symmetry Tele:  SR   Lab Results:  Recent Labs  05/16/16 0156 05/17/16 0427  WBC 11.6* 8.3  HGB 9.4* 8.6*  HCT 30.7* 28.6*  PLT 217 207   BMET  Recent Labs  05/16/16 0156 05/17/16 0427  NA 135 138  K 3.9 3.6  CL 104 110  CO2 21* 21*  GLUCOSE 125* 105*  BUN 33* 23*  CREATININE 1.30* 1.04*  CALCIUM 8.0* 7.8*    Recent Labs  05/16/16 2242 05/17/16 0427  TROPONINI 0.39* 0.30*       Studies/Results: Dg Chest Port 1 View  Result Date: 05/15/2016 CLINICAL DATA:  Decreased oxygen saturation. Low blood pressure. History of CHF, hypertension, asthma. EXAM: PORTABLE CHEST 1 VIEW COMPARISON:  05/02/2016 FINDINGS: Increasing linear infiltrates and volume loss in the right mid and lower lungs likely represent developing area of pneumonia. Normal heart size and pulmonary vascularity. No blunting of costophrenic angles. No pneumothorax. Calcification of the aorta. Degenerative changes in the spine and shoulders. IMPRESSION: Increasing linear infiltrates and volume loss in the right mid and lower lungs may represent developing area of pneumonia. Electronically Signed   By: Burman NievesWilliam  Stevens M.D.   On: 05/15/2016 22:16    Medications: I have reviewed the patient's current medications. Scheduled Meds: . docusate sodium  100 mg Oral QHS  . enoxaparin (LOVENOX) injection  40 mg Subcutaneous Once  . guaiFENesin  1,200 mg Oral BID  . insulin aspart  0-20 Units Subcutaneous TID WC  . insulin aspart  0-5 Units Subcutaneous QHS  . insulin aspart  6 Units Subcutaneous TID WC  . levothyroxine  150 mcg Oral QAC breakfast  . mouth rinse  15 mL Mouth Rinse BID  . piperacillin-tazobactam (ZOSYN)  IV  3.375 g Intravenous Q8H  . polyethylene glycol  17 g Oral Daily  . vancomycin  1,500 mg Intravenous Q24H   Continuous Infusions: . sodium chloride 75 mL/hr at 05/17/16 0808   PRN Meds:.acetaminophen **OR** acetaminophen, bisacodyl, HYDROcodone-acetaminophen, menthol-cetylpyridinium **OR** phenol, metoCLOPramide **OR** metoCLOPramide (REGLAN) injection, nitroGLYCERIN, ondansetron **OR** ondansetron (ZOFRAN) IV,  promethazine, senna-docusate, sodium phosphate, traMADol  Assessment/Plan: Principal Problem:   HAP (hospital-acquired pneumonia) Active Problems:   Primary localized osteoarthritis of right knee   Sepsis associated hypotension (HCC)   Acute respiratory failure with hypoxia  (HCC)   NSTEMI (non-ST elevated myocardial infarction) (HCC)   Low oxygen saturation    1. Abnormal EKG + Elevated Troponin: patient denies any recent CP. She had a NST 1 month ago, as part of pre-operative assessment, that was negative for ischemia and infarction. EF estimate was normal at 62%. ? If demand ischemia in the setting of recent surgery, perioperative anemia (Hgb 12>>9) and PNA. 2D echo to assess LVF and wall motion has just been done no results. . Troponin level is still down trending 1.04; 0.72;. 0.46; 0.39; 0.30  -LHC will be done Thursday  Will place on schedule tomorrow after lunch with Dr. Clifton James   2. PNA: CXR shows right sided PNA. Continue antibiotics per IM.  3. Right Knee OA: s/p right TKR. Continue management per Ortho. Currently NWB. Was on Eliquis for DVT prophylaxis.   4. Perioperative Anemia: Drop in Hgb from 12.9, pre surgery, to 9.4 post surgery. Today 8.6 receiving a unit of blood.     LOS: 5 days   Time spent with pt. :15 minutes. Nada Boozer  Nurse Practitioner Certified Pager [phone] or after 5pm and on weekends call [phone] 05/17/2016, 1:42 PM   Patient seen and examined. Agree with assessment and plan. No recurrent chest pain; troponins are trending downward. Eliquis on hold; last dose was yesterday am. Coughing with occasional rhonchi and decreased BS at bases. Will f/u ECG. Plan for cath tomorrow afternoon to allow eliquis washout. The risks and benefits of a cardiac catheterization including, but not limited to, death, stroke, MI, kidney damage and bleeding were discussed with the patient who indicates understanding and agrees to proceed.    Lennette Bihari, MD, [facility] 05/17/2016 2:35 PM

## 2016-05-17 NOTE — Evaluation (Signed)
Clinical/Bedside Swallow Evaluation Patient Details  Name: Tamara [name] MRN: [id number] Date of Birth: 1941/06/14  Today's Date: 05/17/2016 Time: SLP Start Time (ACUTE ONLY): 1514 SLP Stop Time (ACUTE ONLY): 1526 SLP Time Calculation (min) (ACUTE ONLY): 12 min  Past Medical History:  Past Medical History:  Diagnosis Date  . Anemia    distant hx of  . Arthritis    KNEES,HANDS,WRIST  . Arthritis    KNEE PAIN, BONE ON BONE  . Asthma    allergy related  . 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 Bronchitis   . Cardiomegaly   . CHF (congestive heart failure) (HCC)    stopped using diuretics due to kidney issues, watching salt intake  . Chronic cough   . Chronic kidney disease    due to DM  . Complication of anesthesia    BP bottomed out during Achilles Tendon repair >65yrs ago  . Constipation due to pain medication   . Diabetes mellitus without complication (HCC)    type 2  . Diverticulitis   . Gout   . Hepatitis    childhood Hep A 75 yrs old  . History of blood transfusion   . Hypertension   . Hypothyroidism   . 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 Obesity   . Palpitations   . Pneumonia    hx of- 05/02/16- last time approx - 6 years ago  . Shortness of breath dyspnea    with exertion  . SVT (supraventricular tachycardia) (HCC)   . Wheezing    Past Surgical History:  Past Surgical History:  Procedure Laterality Date  . ABDOMINAL HYSTERECTOMY    . ABDOMINAL HYSTERECTOMY  1984  . ACHILLES TENDON REPAIR Left   . CARDIAC CATHETERIZATION     [place]  . CATARACT EXTRACTION W/PHACO Left 02/10/2015   Procedure: CATARACT EXTRACTION PHACO AND INTRAOCULAR LENS PLACEMENT (IOC);  Surgeon: Lockie Mola, MD;  Location: [facility];  Service: Ophthalmology;  Laterality: Left;  DIABETIC  . CATARACT EXTRACTION W/PHACO Right 02/24/2015   Procedure: CATARACT EXTRACTION PHACO AND INTRAOCULAR LENS PLACEMENT (IOC);  Surgeon:  Lockie Mola, MD;  Location: [facility];  Service: Ophthalmology;  Laterality: Right;  . CESAREAN SECTION  1978  . CHOLECYSTECTOMY  1986  . EYE SURGERY Bilateral    Cataract   . TOTAL KNEE ARTHROPLASTY Right 05/12/2016   Procedure: TOTAL KNEE ARTHROPLASTY;  Surgeon: Frederico Hamman, MD;  Location: [facility] OR;  Service: Orthopedics;  Laterality: Right;   HPI:  75 yo female with Right knee OA, s/p TKA on 05/12/16. Resp issues, tachycardia on 9/18; new RLL PNA. On oral diet.    Assessment / Plan / Recommendation Clinical Impression    Pt presents with functional oropharyngeal swallow marked by adequate reciprocity of swallowing and breathing.  RR is well under threshold for concerns re: swallowing/breathing coordination.  Pt is exhaling post-swallow; there are no overt s/s of aspiration.  Mastication is effective, and pt passed 3-oz water test with no deficits.  Recommend continuing regular diet, thin liquids;  SLP f/u not warranted.     Aspiration Risk  No limitations    Diet Recommendation   reg diet, thin liquids  Medication Administration: Whole meds with liquid    Other  Recommendations Oral Care Recommendations: Oral care BID   Follow up Recommendations None      Frequency and Duration  Prognosis        Swallow Study   General Date of Onset: 05/12/16 HPI: 75 yo female with Right knee OA, s/p TKA on 05/12/16. Resp issues, tachycardia on 9/18; new RLL PNA. On oral diet.  Type of Study: Bedside Swallow Evaluation Previous Swallow Assessment: no Diet Prior to this Study: Regular;Thin liquids Temperature Spikes Noted: No Respiratory Status: Nasal cannula Behavior/Cognition: Alert;Cooperative;Pleasant mood Oral Cavity Assessment: Within Functional Limits Oral Care Completed by SLP: No Oral Cavity - Dentition: Adequate natural dentition Vision: Functional for self-feeding Self-Feeding Abilities: Able to feed self Patient Positioning: Upright in  chair Baseline Vocal Quality: Normal Volitional Cough: Strong Volitional Swallow: Able to elicit    Oral/Motor/Sensory Function Overall Oral Motor/Sensory Function: Within functional limits   Ice Chips Ice chips: Within functional limits Presentation: Self Fed   Thin Liquid Thin Liquid: Within functional limits Presentation: Cup;Self Fed;Straw    Nectar Thick Nectar Thick Liquid: Not tested   Honey Thick Honey Thick Liquid: Not tested   Puree Puree: Within functional limits Presentation: Self Fed;Spoon   Solid   GO   Solid: Within functional limits Presentation: Self Fed       Tamara [name], KentuckyMA CCC/SLP Pager [phone]  Blenda MountsCouture, Tamara [name] 05/17/2016,3:26 PM

## 2016-05-17 NOTE — Care Management Important Message (Signed)
Important Message  Patient Details  Name: Tamara [name] MRN: [id number] Date of Birth: 1941-01-07   Medicare Important Message Given:  Yes    [name] 05/17/2016, 9:20 AM

## 2016-05-17 NOTE — Progress Notes (Signed)
  Echocardiogram 2D Echocardiogram has been performed.  Janalyn HarderWest, Chipper Koudelka R 05/17/2016, 1:37 PM

## 2016-05-17 NOTE — Progress Notes (Signed)
Orthopedic Tech Progress Note Patient Details:  Tamara [name] [birth year] [id number]  Patient ID: Tamara [name], female   DOB: [birth year], 75 y.o.   MRN: [id number] Applied cpm 0-55  [name], Joziyah Roblero J 05/17/2016, 6:39 AM

## 2016-05-17 NOTE — Progress Notes (Signed)
SATURATION QUALIFICATIONS: (This note is used to comply with regulatory documentation for home oxygen)  Patient Saturations on Room Air at Rest = 87%  Patient Saturations on Room Air while Ambulating = pt at 87% on RA at rest, did not test at this time.  Patient Saturations on 3 Liters of oxygen while Ambulating = 92-98%  Please briefly explain why patient needs home oxygen: Pt desats on RA at rest and needs O2 to ambulate.  VamoJennifer Mekenzie Modeste, South CarolinaPT, [place]DPT [phone]

## 2016-05-17 NOTE — Progress Notes (Signed)
Occupational Therapy Treatment Patient Details Name: Tamara [name] MRN: [id number] DOB: 14-Feb-1941 Today's Date: 05/17/2016    History of present illness 75 yo female with Right knee OA, s/p TKA on 05/12/16.  Resp issues on 9/18 and transfer to ICU.   OT comments  Pt demonstrates improved functional mobility this date and is able to transfer with +2 mod A   Follow Up Recommendations  SNF    Equipment Recommendations  None recommended by OT    Recommendations for Other Services      Precautions / Restrictions Precautions Precautions: Fall;Knee Precaution Booklet Issued: Yes (comment) Precaution Comments:  reviewed precautions and no pillow under knee. Required Braces or Orthoses: Knee Immobilizer - Right Knee Immobilizer - Right: On when out of bed or walking Restrictions Weight Bearing Restrictions: Yes RLE Weight Bearing: Weight bearing as tolerated       Mobility Bed Mobility Overal bed mobility: Needs Assistance Bed Mobility: Supine to Sit     Supine to sit: Mod assist;+2 for safety/equipment;HOB elevated     General bed mobility comments: Used pad to scoot pt to EOB.  Mod assist overall.  Transfers Overall transfer level: Needs assistance Equipment used: Rolling walker (2 wheeled) Transfers: Sit to/from [employer];Stand Pivot Transfers Sit to Stand: Max assist Stand pivot transfers: Max assist;+2 safety/equipment       General transfer comment: Increased time to stabilize in standing; cues for hand placement and technique;assist to power up and to extend hips for upright posture and for hand placement on RW. SPT to chair with Mod A to help with weight shifting, and RW management.Pt maintains flexed posture.     Balance Overall balance assessment: Needs assistance Sitting-balance support: Feet supported Sitting balance-Leahy Scale: Fair     Standing balance support: During functional activity Standing balance-Leahy Scale: Poor Standing balance comment:  heavily reliant on RW                   ADL Overall ADL's : Needs assistance/impaired                         Toilet Transfer: Moderate assistance;+2 for physical assistance;Stand-pivot;BSC   Toileting- Clothing Manipulation and Hygiene: Total assistance;Sit to/from stand                Vision                     Perception     Praxis      Cognition   Behavior During Therapy: East Alabama Medical CenterWFL for tasks assessed/performed Overall Cognitive Status: Within Functional Limits for tasks assessed                       Extremity/Trunk Assessment               Exercises Total Joint Exercises Long Arc Quad: Tamara BoysAAROM;Right;10 reps;Seated Knee Flexion: AAROM;Right;10 reps;Seated   Shoulder Instructions       General Comments      Pertinent Vitals/ Pain       Pain Assessment: Faces Faces Pain Scale: Hurts even more Pain Location: Rt knee  Pain Descriptors / Indicators: Grimacing;Guarding Pain Intervention(s): Monitored during session  Home Living                                          Prior Functioning/Environment  Frequency  Min 2X/week        Progress Toward Goals  OT Goals(current goals can now be found in the care plan section)  Progress towards OT goals: Progressing toward goals  Acute Rehab OT Goals Patient Stated Goal: not stated ADL Goals Pt Will Perform Lower Body Bathing: with supervision;with adaptive equipment;sit to/from stand Pt Will Perform Lower Body Dressing: with supervision;with adaptive equipment;sit to/from stand Pt Will Transfer to Toilet: with supervision;bedside commode;ambulating Pt Will Perform Toileting - Clothing Manipulation and hygiene: with supervision;sit to/from stand  Plan Discharge plan remains appropriate    Co-evaluation    PT/OT/SLP Co-Evaluation/Treatment: Yes Reason for Co-Treatment: For patient/therapist safety   OT goals addressed during session:  ADL's and self-care      End of Session Equipment Utilized During Treatment: Rolling walker   Activity Tolerance Patient limited by fatigue   Patient Left in chair;with family/visitor present   Nurse Communication Mobility status        Time: 1430-1458 OT Time Calculation (min): 28 min  Charges: OT General Charges $OT Visit: 1 Procedure OT Treatments $Self Care/Home Management : 8-22 mins  Tamara [name] M 05/17/2016, 5:54 PM

## 2016-05-17 NOTE — Progress Notes (Signed)
Consult follow up  PROGRESS NOTE                                                                                                                                                                                                             Patient Demographics:    Tamara [name], is a 75 y.o. female, DOB - 09-Jun-1941, ZOX:[id number]  Admit date - 05/12/2016   Admitting Physician Frederico Hamman, MD  Outpatient Primary MD for the patient is Alan Mulder, MD LOS - 5 No chief complaint on file.     Brief Narrative   Tamara [name] is an 75 y.o. female with H/o DM, Hypothyroidism was admitted to hospital 5 days ago for TKA, underwent surgery 9/15 per Dr.Caffrey, post op she developed productive cough and congestion, mild tachycardia and hypotension. TRH was consulted,  Tachycardia and hypotension are improving with ongoing IVF.  CXR demonstrates RLL PNA. She was on Eliquis for DVT prophylaxis. She was also noted to have Troponin of 1.04 and Abnormal EKG-new T wave changes and hence Cards was consulted, plan for LHC on Thursday   Subjective:    Tamara [name] today feels ok, no chest pain or dyspnea, feels a little weak, got up with PT yesterday  Assessment  & Plan :    Acute hypoxic respiratory failure - This is related to HCAP, low probability for PE given patient has been on Eliquis post op - continue Rx as below, improving  HCAP - continue Vanc and Zosyn today - stop Vanc tomorrow if Cx negative - incentive spirometry, OOB, pulm toilet  NSTEMI -post op, in setting of hypotension, infection and post op anemia -pre op had a negative stress test -due to new EKG changes with abnormal troponin of 1.0, cards consulted, and LHC planned for Thursday after holding Eliquis - troponin trended down further, I held her Eliquis, havent ordered IV heparin yet due to further drop in Hb, will defer to cards -Troponin trending down, no chest pain -will transfuse  1  unit PRBC  Acute post op blood loss anemia -worsened with hemodilution -Hb trended down from 11.4 to 8.4 -no overt bleeding -Will transfuse 1 unit PRBC today  Hypotension - Continue with IV fluids, - hold Lasix and Coreg  - transfuse 1 unit PRBC  Diabetes mellitus - hold oral hypoglycemic agent, continue with insulin sliding  scale  Hypothyroidism - Continue levothyroxine  Status post right TKA - per primary orthopedic team -held Eliquis for Cath tomorrow -lovenox for DVT prophylaxis x1 dose today  Code Status : Full Family Communication  : daughter at bedside Disposition Plan  : keep in SDU  DVT Prophylaxis  :Eliquis held  Lab Results  Component Value Date   PLT 207 05/17/2016    Antibiotics  :   Anti-infectives    Start     Dose/Rate Route Frequency Ordered Stop   05/17/16 0600  vancomycin (VANCOCIN) IVPB 750 mg/150 ml premix  Status:  Discontinued     750 mg 150 mL/hr over 60 Minutes Intravenous Every 24 hours 05/16/16 0308 05/16/16 0746   05/17/16 0400  vancomycin (VANCOCIN) 1,500 mg in sodium chloride 0.9 % 500 mL IVPB     1,500 mg 250 mL/hr over 120 Minutes Intravenous Every 24 hours 05/16/16 0746 05/23/16 2359   05/16/16 1200  ceFEPIme (MAXIPIME) 1 g in dextrose 5 % 50 mL IVPB  Status:  Discontinued    Comments:  Cefepime 1 g IV q12h for CrCl < 60 mL/min   1 g 100 mL/hr over 30 Minutes Intravenous 2 times daily 05/16/16 0310 05/16/16 0720   05/16/16 0800  piperacillin-tazobactam (ZOSYN) IVPB 3.375 g     3.375 g 12.5 mL/hr over 240 Minutes Intravenous Every 8 hours 05/16/16 0746     05/16/16 0230  vancomycin (VANCOCIN) 1,500 mg in sodium chloride 0.9 % 500 mL IVPB     1,500 mg 250 mL/hr over 120 Minutes Intravenous  Once 05/16/16 0212 05/16/16 0556   05/16/16 0200  ceFEPIme (MAXIPIME) 1 g in dextrose 5 % 50 mL IVPB  Status:  Discontinued     1 g 100 mL/hr over 30 Minutes Intravenous Every 8 hours 05/16/16 0113 05/16/16 0310   05/16/16 0030  doxycycline  (VIBRAMYCIN) 100 mg in dextrose 5 % 250 mL IVPB  Status:  Discontinued     100 mg 125 mL/hr over 120 Minutes Intravenous Every 12 hours 05/16/16 0000 05/16/16 0112   05/12/16 1630  ceFAZolin (ANCEF) IVPB 2g/100 mL premix     2 g 200 mL/hr over 30 Minutes Intravenous Every 6 hours 05/12/16 1445 05/12/16 2349   05/12/16 1000  ceFAZolin (ANCEF) 3 g in dextrose 5 % 50 mL IVPB     3 g 130 mL/hr over 30 Minutes Intravenous To [facility] 05/11/16 0828 05/12/16 1056        Objective:   Vitals:   05/16/16 2341 05/16/16 2357 05/17/16 0500 05/17/16 0750  BP: (!) 92/49 (!) 92/49 (!) 93/46 (!) 98/48  Pulse: 88 85 84 82  Resp: 18 18 17  (!) 23  Temp: 98.6 F (37 C)  98.3 F (36.8 C) 98.3 F (36.8 C)  TempSrc: Oral  Axillary Oral  SpO2: 94% 95% 90% 100%  Weight:      Height:        Wt Readings from Last 3 Encounters:  05/16/16 122.6 kg (270 lb 4.5 oz)  05/02/16 115.8 kg (255 lb 4.8 oz)  03/16/16 115.2 kg (254 lb)     Intake/Output Summary (Last 24 hours) at 05/17/16 1141 Last data filed at 05/17/16 1000  Gross per 24 hour  Intake          2881.67 ml  Output             1425 ml  Net          1456.67 ml  Physical Exam  Awake Alert, Oriented X 3,  Supple Neck,No JVD,   Symmetrical Chest wall movement, Good air movement bilaterally, no wheezing RRR,No Gallops,Rubs or new Murmurs, No Parasternal Heave +ve B.Sounds, Abd Soft, No tenderness,  No Cyanosis, Clubbing or edema, Right knee bandaged    Data Review:    CBC  Recent Labs Lab 05/13/16 0340 05/14/16 0402 05/15/16 0302 05/16/16 0156 05/17/16 0427  WBC 11.0* 15.9* 13.6* 11.6* 8.3  HGB 11.1* 11.2* 10.2* 9.4* 8.6*  HCT 37.3 36.6 33.4* 30.7* 28.6*  PLT 238 233 230 217 207  MCV 90.8 89.5 89.8 89.5 89.9  MCH 27.0 27.4 27.4 27.4 27.0  MCHC 29.8* 30.6 30.5 30.6 30.1  RDW 15.5 15.3 15.2 15.3 15.7*    Chemistries   Recent Labs Lab 05/13/16 0340 05/16/16 0156 05/17/16 0427  NA 137 135 138  K 4.8 3.9  3.6  CL 103 104 110  CO2 23 21* 21*  GLUCOSE 130* 125* 105*  BUN 17 33* 23*  CREATININE 1.13* 1.30* 1.04*  CALCIUM 8.9 8.0* 7.8*   ------------------------------------------------------------------------------------------------------------------ No results for input(s): CHOL, HDL, LDLCALC, TRIG, CHOLHDL, LDLDIRECT in the last 72 hours.  No results found for: HGBA1C ------------------------------------------------------------------------------------------------------------------ No results for input(s): TSH, T4TOTAL, T3FREE, THYROIDAB in the last 72 hours.  Invalid input(s): FREET3 ------------------------------------------------------------------------------------------------------------------ No results for input(s): VITAMINB12, FOLATE, FERRITIN, TIBC, IRON, RETICCTPCT in the last 72 hours.  Coagulation profile  Recent Labs Lab 05/16/16 0156  INR 2.06    No results for input(s): DDIMER in the last 72 hours.  Cardiac Enzymes  Recent Labs Lab 05/16/16 1622 05/16/16 2242 05/17/16 0427  TROPONINI 0.46* 0.39* 0.30*   ------------------------------------------------------------------------------------------------------------------ No results found for: BNP  Inpatient Medications  Scheduled Meds: . sodium chloride   Intravenous Once  . docusate sodium  100 mg Oral QHS  . guaiFENesin  1,200 mg Oral BID  . insulin aspart  0-20 Units Subcutaneous TID WC  . insulin aspart  0-5 Units Subcutaneous QHS  . insulin aspart  6 Units Subcutaneous TID WC  . levothyroxine  150 mcg Oral QAC breakfast  . mouth rinse  15 mL Mouth Rinse BID  . piperacillin-tazobactam (ZOSYN)  IV  3.375 g Intravenous Q8H  . polyethylene glycol  17 g Oral Daily  . vancomycin  1,500 mg Intravenous Q24H   Continuous Infusions: . sodium chloride 75 mL/hr at 05/17/16 0808   PRN Meds:.acetaminophen **OR** acetaminophen, bisacodyl, HYDROcodone-acetaminophen, menthol-cetylpyridinium **OR** phenol,  metoCLOPramide **OR** metoCLOPramide (REGLAN) injection, nitroGLYCERIN, ondansetron **OR** ondansetron (ZOFRAN) IV, promethazine, senna-docusate, sodium phosphate, traMADol  Micro Results Recent Results (from the past 240 hour(s))  Culture, blood (routine x 2) Call MD if unable to obtain prior to antibiotics being given     Status: None (Preliminary result)   Collection Time: 05/16/16  1:36 AM  Result Value Ref Range Status   Specimen Description BLOOD RIGHT HAND  Final   Special Requests IN PEDIATRIC BOTTLE 2CC  Final   Culture NO GROWTH < 12 HOURS  Final   Report Status PENDING  Incomplete  Culture, blood (routine x 2) Call MD if unable to obtain prior to antibiotics being given     Status: None (Preliminary result)   Collection Time: 05/16/16  1:40 AM  Result Value Ref Range Status   Specimen Description BLOOD LEFT ANTECUBITAL  Final   Special Requests BOTTLES DRAWN AEROBIC AND ANAEROBIC 5CC  Final   Culture NO GROWTH < 12 HOURS  Final   Report Status PENDING  Incomplete  Radiology Reports Dg Chest 2 View  Result Date: 05/02/2016 CLINICAL DATA:  Preop for knee replacement. EXAM: CHEST  2 VIEW COMPARISON:  11/20/2015 FINDINGS: Linear areas of scarring in the mid and lower lungs bilaterally. Heart is normal size. No confluent opacities or effusions. No acute bony abnormality. IMPRESSION: Areas of scarring in the lungs bilaterally.  No active disease. Electronically Signed   By: Charlett NoseKevin  Dover M.D.   On: 05/02/2016 12:32   Dg Chest Port 1 View  Result Date: 05/15/2016 CLINICAL DATA:  Decreased oxygen saturation. Low blood pressure. History of CHF, hypertension, asthma. EXAM: PORTABLE CHEST 1 VIEW COMPARISON:  05/02/2016 FINDINGS: Increasing linear infiltrates and volume loss in the right mid and lower lungs likely represent developing area of pneumonia. Normal heart size and pulmonary vascularity. No blunting of costophrenic angles. No pneumothorax. Calcification of the aorta. Degenerative  changes in the spine and shoulders. IMPRESSION: Increasing linear infiltrates and volume loss in the right mid and lower lungs may represent developing area of pneumonia. Electronically Signed   By: Burman NievesWilliam  Stevens M.D.   On: 05/15/2016 22:16    No charge  Rori Goar M.D on 05/17/2016 at 11:41 AM  Between 7am to 7pm - Pager - [phone]  After 7pm go to [email or web address] - password [email or web address]  [facility] -  Office  [phone]

## 2016-05-18 ENCOUNTER — Encounter (HOSPITAL_COMMUNITY)
Admission: RE | Disposition: A | Discharge status: Skilled Nursing Facility | Payer: Self-pay | Source: Ambulatory Visit | Attending: Orthopedic Surgery

## 2016-05-18 ENCOUNTER — Encounter (HOSPITAL_COMMUNITY): Payer: Self-pay | Admitting: Cardiology

## 2016-05-18 DIAGNOSIS — R778 Other specified abnormalities of plasma proteins: Secondary | ICD-10-CM

## 2016-05-18 DIAGNOSIS — R7989 Other specified abnormal findings of blood chemistry: Secondary | ICD-10-CM

## 2016-05-18 HISTORY — PX: CARDIAC CATHETERIZATION: SHX172

## 2016-05-18 LAB — CBC
HEMATOCRIT: 32.1 % — AB (ref 36.0–46.0)
HEMOGLOBIN: 9.9 g/dL — AB (ref 12.0–15.0)
MCH: 28 pg (ref 26.0–34.0)
MCHC: 30.8 g/dL (ref 30.0–36.0)
MCV: 90.7 fL (ref 78.0–100.0)
Platelets: 246 10*3/uL (ref 150–400)
RBC: 3.54 MIL/uL — AB (ref 3.87–5.11)
RDW: 15.7 % — ABNORMAL HIGH (ref 11.5–15.5)
WBC: 8.3 10*3/uL (ref 4.0–10.5)

## 2016-05-18 LAB — LIPID PANEL
CHOLESTEROL: 126 mg/dL (ref 0–200)
HDL: 33 mg/dL — AB (ref >40)
LDL CALC: 75 mg/dL (ref 0–99)
TRIGLYCERIDES: 91 mg/dL (ref <150)
Total CHOL/HDL Ratio: 3.8 RATIO
VLDL: 18 mg/dL (ref 0–40)

## 2016-05-18 LAB — GLUCOSE, CAPILLARY
GLUCOSE-CAPILLARY: 123 mg/dL — AB (ref 65–99)
Glucose-Capillary: 92 mg/dL (ref 65–99)
Glucose-Capillary: 93 mg/dL (ref 65–99)

## 2016-05-18 LAB — BASIC METABOLIC PANEL
ANION GAP: 6 (ref 5–15)
BUN: 15 mg/dL (ref 6–20)
CHLORIDE: 110 mmol/L (ref 101–111)
CO2: 23 mmol/L (ref 22–32)
CREATININE: 0.91 mg/dL (ref 0.44–1.00)
Calcium: 8.2 mg/dL — ABNORMAL LOW (ref 8.9–10.3)
GFR calc non Af Amer: >60 mL/min (ref >60)
Glucose, Bld: 92 mg/dL (ref 65–99)
Potassium: 3.6 mmol/L (ref 3.5–5.1)
SODIUM: 139 mmol/L (ref 135–145)

## 2016-05-18 LAB — PROTIME-INR
INR: 1.48
PROTHROMBIN TIME: 18 s — AB (ref 11.4–15.2)

## 2016-05-18 LAB — TYPE AND SCREEN
ABO/RH(D): A POS
ANTIBODY SCREEN: NEGATIVE
Unit division: 0

## 2016-05-18 SURGERY — LEFT HEART CATH AND CORONARY ANGIOGRAPHY
Anesthesia: LOCAL

## 2016-05-18 MED ORDER — HEPARIN SODIUM (PORCINE) 1000 UNIT/ML IJ SOLN
INTRAMUSCULAR | Status: AC
  Filled 2016-05-18: qty 1

## 2016-05-18 MED ORDER — VERAPAMIL HCL 2.5 MG/ML IV SOLN
INTRAVENOUS | Status: AC
  Filled 2016-05-18: qty 2

## 2016-05-18 MED ORDER — HEPARIN (PORCINE) IN NACL 2-0.9 UNIT/ML-% IJ SOLN
INTRAMUSCULAR | Status: DC | PRN
  Administered 2016-05-18: 1500 mL

## 2016-05-18 MED ORDER — SODIUM CHLORIDE 0.9% FLUSH: 3.0000 mL | INTRAVENOUS | Status: DC | PRN

## 2016-05-18 MED ORDER — LIDOCAINE HCL (PF) 1 % IJ SOLN
INTRAMUSCULAR | Status: AC
  Filled 2016-05-18: qty 30

## 2016-05-18 MED ORDER — VERAPAMIL HCL 2.5 MG/ML IV SOLN
INTRAVENOUS | Status: DC | PRN
  Administered 2016-05-18: 10 mL via INTRA_ARTERIAL

## 2016-05-18 MED ORDER — FENTANYL CITRATE (PF) 100 MCG/2ML IJ SOLN
INTRAMUSCULAR | Status: AC
  Filled 2016-05-18: qty 2

## 2016-05-18 MED ORDER — MIDAZOLAM HCL 2 MG/2ML IJ SOLN
INTRAMUSCULAR | Status: AC
  Filled 2016-05-18: qty 2

## 2016-05-18 MED ORDER — MIDAZOLAM HCL 2 MG/2ML IJ SOLN
INTRAMUSCULAR | Status: DC | PRN
  Administered 2016-05-18 (×2): 1 mg via INTRAVENOUS

## 2016-05-18 MED ORDER — HEPARIN (PORCINE) IN NACL 2-0.9 UNIT/ML-% IJ SOLN
INTRAMUSCULAR | Status: AC
  Filled 2016-05-18: qty 1000

## 2016-05-18 MED ORDER — HEPARIN (PORCINE) IN NACL 2-0.9 UNIT/ML-% IJ SOLN
INTRAMUSCULAR | Status: AC
  Filled 2016-05-18: qty 500

## 2016-05-18 MED ORDER — IOPAMIDOL (ISOVUE-370) INJECTION 76%
INTRAVENOUS | Status: DC | PRN
  Administered 2016-05-18: 55 mL via INTRA_ARTERIAL

## 2016-05-18 MED ORDER — SODIUM CHLORIDE 0.9% FLUSH
3.0000 mL | Freq: Two times a day (BID) | INTRAVENOUS | Status: DC
  Administered 2016-05-19 – 2016-05-20 (×4): 3 mL via INTRAVENOUS

## 2016-05-18 MED ORDER — FENTANYL CITRATE (PF) 100 MCG/2ML IJ SOLN
INTRAMUSCULAR | Status: DC | PRN
  Administered 2016-05-18 (×2): 25 ug via INTRAVENOUS
  Administered 2016-05-18: 50 ug via INTRAVENOUS

## 2016-05-18 MED ORDER — IOPAMIDOL (ISOVUE-370) INJECTION 76%
INTRAVENOUS | Status: AC
  Filled 2016-05-18: qty 100

## 2016-05-18 MED ORDER — SODIUM CHLORIDE 0.9 % IV SOLN: 250.0000 mL | INTRAVENOUS | Status: DC | PRN

## 2016-05-18 MED ORDER — LIDOCAINE HCL (PF) 1 % IJ SOLN
INTRAMUSCULAR | Status: DC | PRN
  Administered 2016-05-18: 3 mL

## 2016-05-18 MED ORDER — HEPARIN SODIUM (PORCINE) 1000 UNIT/ML IJ SOLN
INTRAMUSCULAR | Status: DC | PRN
  Administered 2016-05-18: 6000 [IU] via INTRAVENOUS

## 2016-05-18 MED ORDER — SODIUM CHLORIDE 0.9 % IV SOLN
INTRAVENOUS | Status: AC
  Administered 2016-05-18: via INTRAVENOUS

## 2016-05-18 SURGICAL SUPPLY — 11 items
CATH INFINITI 5 FR JL3.5 (CATHETERS) ×1 IMPLANT
CATH INFINITI JR4 5F (CATHETERS) ×1 IMPLANT
DEVICE RAD COMP TR BAND LRG (VASCULAR PRODUCTS) ×1 IMPLANT
GLIDESHEATH SLEND SS 6F .021 (SHEATH) ×1 IMPLANT
HOVERMATT SINGLE USE (MISCELLANEOUS) ×1 IMPLANT
KIT HEART LEFT (KITS) ×2 IMPLANT
PACK CARDIAC CATHETERIZATION (CUSTOM PROCEDURE TRAY) ×2 IMPLANT
TRANSDUCER W/STOPCOCK (MISCELLANEOUS) ×2 IMPLANT
TUBING CIL FLEX 10 FLL-RA (TUBING) ×2 IMPLANT
WIRE HI TORQ VERSACORE-J 145CM (WIRE) ×1 IMPLANT
WIRE SAFE-T 1.5MM-J .035X260CM (WIRE) ×1 IMPLANT

## 2016-05-18 NOTE — Consult Note (Signed)
Medical Consultation   LUZMARIA DEVAUX  WUJ:[id number]  DOB: 1940/11/14  DOA: 05/12/2016  PCP: Alan Mulder, MD    Requesting physician:  Physician Frederico Hamman, MD  Reason for consultation: Multiple medical problems   History of Present Illness: Tamara [name] is an   75 y.o.female with H/o DM, Hypothyroidism wasadmitted to hospital 5 days ago for TKA, underwent surgery 9/15 per Dr.Caffrey, post op she developed productive cough and congestion, mild tachycardia and hypotension. TRH was consulted,  Tachycardia and hypotension are improving with ongoing IVF. CXR demonstrates RLL PNA. She was on Eliquis for DVT prophylaxis. She was also noted to have Troponin of 1.04 and Abnormal EKG-new T wave changes and hence Cards was consulted, plan for LHC on Thursday    Review of Systems:  ROS As per HPI otherwise 10 point review of systems negative.     Past Medical History: Past Medical History:  Diagnosis Date  . Anemia    distant hx of  . Arthritis    KNEES,HANDS,WRIST  . Arthritis    KNEE PAIN, BONE ON BONE  . Asthma    allergy related  . 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 Bronchitis   . Cardiomegaly   . CHF (congestive heart failure) (HCC)    stopped using diuretics due to kidney issues, watching salt intake  . Chronic cough   . Chronic kidney disease    due to DM  . Complication of anesthesia    BP bottomed out during Achilles Tendon repair >81yrs ago  . Constipation due to pain medication   . Diabetes mellitus without complication (HCC)    type 2  . Diverticulitis   . Gout   . Hepatitis    childhood Hep A 75 yrs old  . History of blood transfusion   . Hypertension   . Hypothyroidism   . 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 Obesity   . Palpitations   . Pneumonia    hx of- 05/02/16- last time approx - 6 years ago  . Shortness of breath dyspnea    with exertion  . SVT (supraventricular  tachycardia) (HCC)   . Wheezing     Past Surgical History: Past Surgical History:  Procedure Laterality Date  . ABDOMINAL HYSTERECTOMY    . ABDOMINAL HYSTERECTOMY  1984  . ACHILLES TENDON REPAIR Left   . CARDIAC CATHETERIZATION     [place]  . CATARACT EXTRACTION W/PHACO Left 02/10/2015   Procedure: CATARACT EXTRACTION PHACO AND INTRAOCULAR LENS PLACEMENT (IOC);  Surgeon: Lockie Mola, MD;  Location: [facility];  Service: Ophthalmology;  Laterality: Left;  DIABETIC  . CATARACT EXTRACTION W/PHACO Right 02/24/2015   Procedure: CATARACT EXTRACTION PHACO AND INTRAOCULAR LENS PLACEMENT (IOC);  Surgeon: Lockie Mola, MD;  Location: [facility];  Service: Ophthalmology;  Laterality: Right;  . CESAREAN SECTION  1978  . CHOLECYSTECTOMY  1986  . EYE SURGERY Bilateral    Cataract   . TOTAL KNEE ARTHROPLASTY Right 05/12/2016   Procedure: TOTAL KNEE ARTHROPLASTY;  Surgeon: Frederico Hamman, MD;  Location: [facility] OR;  Service: Orthopedics;  Laterality: Right;     Allergies:   Allergies  Allergen Reactions  . Codeine Nausea And Vomiting     Social History:  reports that she has never smoked. She has quit using smokeless tobacco. She reports that she does not  drink alcohol or use drugs.   Family History: Family History  Problem Relation Age of Onset  . Lung cancer Mother   . Heart attack Mother   . Heart Problems Father   . Hypertension Father   . Other Father     respiratory problems  . Ovarian cancer Maternal Grandmother   . Cancer Maternal Grandfather   . Stroke Paternal Grandmother   . Diabetes Brother   . Liver cancer Brother   . COPD Sister   . Other Sister     sepsis  . Heart attack Son   . Heart attack Son   . Rheum arthritis Daughter   . Rheum arthritis Daughter   . Thyroid cancer Daughter   . Hepatitis C Daughter       Procedures/Significant Events:    Cultures 9/19 blood right hand/left AC NGTD    Antimicrobials: Zosyn  9/19>> Vancomycin 9/19>>   Devices    LINES / TUBES:      Continuous Infusions: . sodium chloride       Physical Exam: Vitals:   05/17/16 2020 05/18/16 0000 05/18/16 0442 05/18/16 0728  BP:  (!) 99/48 (!) 119/97 (!) 115/55  Pulse:  87 71 71  Resp:  (!) 21 18 20   Temp:  99.3 F (37.4 C) 97.8 F (36.6 C) 98 F (36.7 C)  TempSrc:  Oral Oral Oral  SpO2:  90% 94% 100%  Weight: 131.6 kg (290 lb 2 oz)     Height:           Data reviewed:  I have personally reviewed following labs and imaging studies Labs:  CBC:  Recent Labs Lab 05/14/16 0402 05/15/16 0302 05/16/16 0156 05/17/16 0427 05/18/16 0148  WBC 15.9* 13.6* 11.6* 8.3 8.3  HGB 11.2* 10.2* 9.4* 8.6* 9.9*  HCT 36.6 33.4* 30.7* 28.6* 32.1*  MCV 89.5 89.8 89.5 89.9 90.7  PLT 233 230 217 207 246    Basic Metabolic Panel:  Recent Labs Lab 05/13/16 0340 05/16/16 0156 05/17/16 0427 05/18/16 0538  NA 137 135 138 139  K 4.8 3.9 3.6 3.6  CL 103 104 110 110  CO2 23 21* 21* 23  GLUCOSE 130* 125* 105* 92  BUN 17 33* 23* 15  CREATININE 1.13* 1.30* 1.04* 0.91  CALCIUM 8.9 8.0* 7.8* 8.2*   GFR Estimated Creatinine Clearance: 69.7 mL/min (by C-G formula based on SCr of 0.91 mg/dL). Liver Function Tests: No results for input(s): AST, ALT, ALKPHOS, BILITOT, PROT, ALBUMIN in the last 168 hours. No results for input(s): LIPASE, AMYLASE in the last 168 hours. No results for input(s): AMMONIA in the last 168 hours. Coagulation profile  Recent Labs Lab 05/16/16 0156 05/18/16 0148  INR 2.06 1.48    Cardiac Enzymes:  Recent Labs Lab 05/16/16 0203 05/16/16 0741 05/16/16 1622 05/16/16 2242 05/17/16 0427  TROPONINI 1.04* 0.72* 0.46* 0.39* 0.30*   BNP: Invalid input(s): POCBNP CBG:  Recent Labs Lab 05/16/16 2204 05/17/16 0750 05/17/16 1207 05/17/16 1659 05/17/16 2125  GLUCAP 127* 93 132* 101* 118*   D-Dimer No results for input(s): DDIMER in the last 72 hours. Hgb A1c No results for  input(s): HGBA1C in the last 72 hours. Lipid Profile No results for input(s): CHOL, HDL, LDLCALC, TRIG, CHOLHDL, LDLDIRECT in the last 72 hours. Thyroid function studies No results for input(s): TSH, T4TOTAL, T3FREE, THYROIDAB in the last 72 hours.  Invalid input(s): FREET3 Anemia work up No results for input(s): VITAMINB12, FOLATE, FERRITIN, TIBC, IRON, RETICCTPCT in the last 72  hours. Urinalysis    Component Value Date/Time   COLORURINE YELLOW 05/02/2016 1115   APPEARANCEUR CLOUDY (A) 05/02/2016 1115   APPEARANCEUR Clear 08/01/2013 1926   LABSPEC 1.034 (H) 05/02/2016 1115   LABSPEC 1.015 08/01/2013 1926   PHURINE 5.0 05/02/2016 1115   GLUCOSEU >1000 (A) 05/02/2016 1115   GLUCOSEU Negative 08/01/2013 1926   HGBUR MODERATE (A) 05/02/2016 1115   BILIRUBINUR NEGATIVE 05/02/2016 1115   BILIRUBINUR Negative 08/01/2013 1926   KETONESUR NEGATIVE 05/02/2016 1115   PROTEINUR NEGATIVE 05/02/2016 1115   NITRITE NEGATIVE 05/02/2016 1115   LEUKOCYTESUR MODERATE (A) 05/02/2016 1115   LEUKOCYTESUR Negative 08/01/2013 1926     Microbiology Recent Results (from the past 240 hour(s))  Culture, blood (routine x 2) Call MD if unable to obtain prior to antibiotics being given     Status: None (Preliminary result)   Collection Time: 05/16/16  1:36 AM  Result Value Ref Range Status   Specimen Description BLOOD RIGHT HAND  Final   Special Requests IN PEDIATRIC BOTTLE 2CC  Final   Culture NO GROWTH < 12 HOURS  Final   Report Status PENDING  Incomplete  Culture, blood (routine x 2) Call MD if unable to obtain prior to antibiotics being given     Status: None (Preliminary result)   Collection Time: 05/16/16  1:40 AM  Result Value Ref Range Status   Specimen Description BLOOD LEFT ANTECUBITAL  Final   Special Requests BOTTLES DRAWN AEROBIC AND ANAEROBIC 5CC  Final   Culture NO GROWTH < 12 HOURS  Final   Report Status PENDING  Incomplete       Inpatient Medications:   Scheduled Meds: .  docusate sodium  100 mg Oral QHS  . guaiFENesin  1,200 mg Oral BID  . insulin aspart  0-20 Units Subcutaneous TID WC  . insulin aspart  0-5 Units Subcutaneous QHS  . insulin aspart  6 Units Subcutaneous TID WC  . levothyroxine  150 mcg Oral QAC breakfast  . mouth rinse  15 mL Mouth Rinse BID  . piperacillin-tazobactam (ZOSYN)  IV  3.375 g Intravenous Q8H  . polyethylene glycol  17 g Oral Daily  . sodium chloride flush  3 mL Intravenous Q12H  . vancomycin  1,500 mg Intravenous Q24H   Continuous Infusions: . sodium chloride       Radiological Exams on Admission: No results found.  Impression/Recommendations Principal Problem:   HAP (hospital-acquired pneumonia) Active Problems:   Primary localized osteoarthritis of right knee   Sepsis associated hypotension (HCC)   Acute respiratory failure with hypoxia (HCC)   NSTEMI (non-ST elevated myocardial infarction) (HCC)   Low oxygen saturation  PATIENT IS OFF FLOOR: No charge    Acute hypoxic respiratory failure - This is related to HCAP, low probability for PE given patient has been on Eliquis post op - continue Rx as below, improving  HCAP - continue Vanc and Zosyn today - stop Vanc tomorrow if Cx negative - incentive spirometry, OOB, pulm toilet  NSTEMI -post op, in setting of hypotension, infection and post op anemia -pre op had a negative stress test -due to new EKG changes with abnormal troponin of 1.0, cards consulted, and LHC planned for Thursday after holding Eliquis - troponin trended down further, I held her Eliquis, havent ordered IV heparin yet due to further drop in Hb, will defer to cards -Troponin trending down, no chest pain -Transfuse for hemoglobin<8  Acute post op blood loss anemia -worsened with hemodilution -Hb trended down from  11.4 to 8.4 -no overt bleeding -9/20 Transfused 1 unit PRBC   Hypotension - Continue with IV fluids, - hold Lasix and Coreg  - transfuse 1 unit PRBC  Diabetes  mellitus - hold oral hypoglycemic agent, continue with insulin sliding scale  Hypothyroidism - Continue levothyroxine  Status post right TKA - per primary orthopedic team -held Eliquis for Cath tomorrow -lovenox for DVT prophylaxis x1 dose today   Thank you for this consultation.  [facility] hospitalist team will follow the patient with you.   Time Spent: 0 minutes  Haiden Rawlinson, Roselind Messier M.D. [facility] 05/18/2016, 8:14 AM

## 2016-05-18 NOTE — Progress Notes (Signed)
Pt. placed on CPAP auto for h/s, humidity filled, 2 lpm Oxygen added into circuit, tolerating well.

## 2016-05-18 NOTE — Interval H&P Note (Signed)
History and Physical Interval Note:  05/18/2016 4:40 PM  Tamara [name]  has presented today for cardiac cath with the diagnosis of elevated troponin, abnormal EKG. The various methods of treatment have been discussed with the patient and family. After consideration of risks, benefits and other options for treatment, the patient has consented to  Procedure(s): Left Heart Cath and Coronary Angiography (N/A) as a surgical intervention .  The patient's history has been reviewed, patient examined, no change in status, stable for surgery.  I have reviewed the patient's chart and labs.  Questions were answered to the patient's satisfaction.    Cath Lab Visit (complete for each Cath Lab visit)  Clinical Evaluation Leading to the Procedure:   ACS: Yes.    Non-ACS:    Anginal Classification: CCS II  Anti-ischemic medical therapy: Minimal Therapy (1 class of medications)  Non-Invasive Test Results: No non-invasive testing performed  Prior CABG: No previous CABG         Verne Carrowhristopher Costas Sena

## 2016-05-18 NOTE — Progress Notes (Signed)
Orthopedic Tech Progress Note Patient Details:  Tamara [name] May 02, 1941 [id number] Patient off cpm at 10:20 pm Patient ID: Tamara [name], female   DOB: May 02, 1941, 75 y.o.   MRN: [id number]   Tamara [name], Tamara [name] 05/18/2016, 10:42 PM

## 2016-05-18 NOTE — Progress Notes (Signed)
[facility] still on board to take patient when stable- pt scheduled for heart cath today  [facility] tomorrow or over weekend ( would need DC summary tomorrow if wknd DC)- facility #: [phone] cell# [phone]  CSW will continue to follow  Burna SisJenna H. Tiwanda Threats, LCSWA Clinical Social Worker [phone]

## 2016-05-18 NOTE — Progress Notes (Signed)
Patient Name: Tamara [name] Date of Encounter: 05/18/2016    Primary Cardiologist: Dr. Rennis Golden Patient Profile: 75 year old female with a past medical history of borderline cardiomegaly, dyspnea, palpitations and documented SVT, hypothyroidism, type 2 diabetes and borderline dyslipidemia. Had right TKR on 05/12/16, developed cough found to have HAP, troponin elevated, for heart cath today.   SUBJECTIVE: Feels well, had some chest pain and tightness last night after eating. No chest pain currently.   OBJECTIVE Vitals:   05/18/16 0000 05/18/16 0442 05/18/16 0728 05/18/16 1304  BP: (!) 99/48 (!) 119/97 (!) 115/55 (!) 116/52  Pulse: 87 71 71 78  Resp: (!) 21 18 20  (!) 22  Temp: 99.3 F (37.4 C) 97.8 F (36.6 C) 98 F (36.7 C) 97.8 F (36.6 C)  TempSrc: Oral Oral Oral Oral  SpO2: 90% 94% 100% 93%  Weight:      Height:        Intake/Output Summary (Last 24 hours) at 05/18/16 1316 Last data filed at 05/18/16 1305  Gross per 24 hour  Intake           1820.8 ml  Output             1225 ml  Net            595.8 ml   Filed Weights   05/12/16 0836 05/16/16 0238 05/17/16 2020  Weight: 255 lb (115.7 kg) 270 lb 4.5 oz (122.6 kg) 290 lb 2 oz (131.6 kg)    PHYSICAL EXAM General: Well developed, well nourished, obese female in no acute distress. Head: Normocephalic, atraumatic.  Neck: Supple without bruits, hard to assess JVD. Lungs:  Resp regular and unlabored, CTA. Heart: RRR, S1, S2, no S3, S4, or murmur; no rub. Abdomen: Soft, non-tender, non-distended, BS + x 4.  Extremities: No clubbing, cyanosis, no edema.  Neuro: Alert and oriented X 3. Moves all extremities spontaneously. Psych: Normal affect.  LABS: CBC: Recent Labs  05/17/16 0427 05/18/16 0148  WBC 8.3 8.3  HGB 8.6* 9.9*  HCT 28.6* 32.1*  MCV 89.9 90.7  PLT 207 246   INR: Recent Labs  05/18/16 0148  INR 1.48   Basic Metabolic Panel: Recent Labs  05/17/16 0427 05/18/16 0538  NA 138 139  K 3.6 3.6    CL 110 110  CO2 21* 23  GLUCOSE 105* 92  BUN 23* 15  CREATININE 1.04* 0.91  CALCIUM 7.8* 8.2*  Cardiac Enzymes: Recent Labs  05/16/16 1622 05/16/16 2242 05/17/16 0427  TROPONINI 0.46* 0.39* 0.30*   Fasting Lipid Panel: Recent Labs  05/18/16 0820  CHOL 126  HDL 33*  LDLCALC 75  TRIG 91  CHOLHDL 3.8     Current Facility-Administered Medications:  .  0.9 %  sodium chloride infusion, 250 mL, Intravenous, PRN, Leone Brand, NP .  [EXPIRED] 0.9% sodium chloride infusion, 3 mL/kg/hr, Intravenous, Continuous, Last Rate: 367.8 mL/hr at 05/18/16 0602, 3 mL/kg/hr at 05/18/16 0602 **FOLLOWED BY** 0.9% sodium chloride infusion, 1 mL/kg/hr, Intravenous, Continuous, Leone Brand, NP, Last Rate: 122.6 mL/hr at 05/18/16 0700, 1 mL/kg/hr at 05/18/16 0700 .  acetaminophen (TYLENOL) tablet 650 mg, 650 mg, Oral, Q6H PRN, 650 mg at 05/17/16 2202 **OR** acetaminophen (TYLENOL) suppository 650 mg, 650 mg, Rectal, Q6H PRN, Margart Sickles, PA-C .  bisacodyl (DULCOLAX) EC tablet 5 mg, 5 mg, Oral, Daily PRN, Margart Sickles, PA-C .  docusate sodium (COLACE) capsule 100 mg, 100 mg, Oral, QHS, Joshua Chadwell, PA-C, 100 mg at  05/17/16 2202 .  guaiFENesin (MUCINEX) 12 hr tablet 1,200 mg, 1,200 mg, Oral, BID, Starleen Armsawood S Elgergawy, MD, 1,200 mg at 05/17/16 2202 .  HYDROcodone-acetaminophen (NORCO/VICODIN) 5-325 MG per tablet 1 tablet, 1 tablet, Oral, Q4H PRN, Margart SicklesJoshua Chadwell, PA-C, 1 tablet at 05/18/16 0946 .  insulin aspart (novoLOG) injection 0-20 Units, 0-20 Units, Subcutaneous, TID WC, Margart SicklesJoshua Chadwell, PA-C, 3 Units at 05/17/16 1219 .  insulin aspart (novoLOG) injection 0-5 Units, 0-5 Units, Subcutaneous, QHS, Joshua Chadwell, PA-C .  insulin aspart (novoLOG) injection 6 Units, 6 Units, Subcutaneous, TID WC, Joshua Chadwell, PA-C, 6 Units at 05/17/16 1219 .  levothyroxine (SYNTHROID, LEVOTHROID) tablet 150 mcg, 150 mcg, Oral, QAC breakfast, Margart SicklesJoshua Chadwell, PA-C, 150 mcg at 05/18/16 0837 .  MEDLINE  mouth rinse, 15 mL, Mouth Rinse, BID, Frederico Hammananiel Caffrey, MD, 15 mL at 05/17/16 2216 .  menthol-cetylpyridinium (CEPACOL) lozenge 3 mg, 1 lozenge, Oral, PRN **OR** phenol (CHLORASEPTIC) mouth spray 1 spray, 1 spray, Mouth/Throat, PRN, Joshua Chadwell, PA-C .  metoCLOPramide (REGLAN) tablet 5-10 mg, 5-10 mg, Oral, Q8H PRN **OR** metoCLOPramide (REGLAN) injection 5-10 mg, 5-10 mg, Intravenous, Q8H PRN, Margart SicklesJoshua Chadwell, PA-C, 10 mg at 05/13/16 1454 .  nitroGLYCERIN (NITROSTAT) SL tablet 0.4 mg, 0.4 mg, Sublingual, Q5 min PRN, Joshua Chadwell, PA-C .  ondansetron (ZOFRAN) tablet 4 mg, 4 mg, Oral, Q6H PRN **OR** ondansetron (ZOFRAN) injection 4 mg, 4 mg, Intravenous, Q6H PRN, Margart SicklesJoshua Chadwell, PA-C, 4 mg at 05/16/16 0422 .  piperacillin-tazobactam (ZOSYN) IVPB 3.375 g, 3.375 g, Intravenous, Q8H, Gerilyn Nestlehuy D Dang, RPH, 3.375 g at 05/18/16 0837 .  polyethylene glycol (MIRALAX / GLYCOLAX) packet 17 g, 17 g, Oral, Daily, Zannie CovePreetha Joseph, MD, 17 g at 05/17/16 1005 .  promethazine (PHENERGAN) tablet 12.5 mg, 12.5 mg, Oral, Q6H PRN, Joshua Chadwell, PA-C, 12.5 mg at 05/15/16 1031 .  senna-docusate (Senokot-S) tablet 1 tablet, 1 tablet, Oral, QHS PRN, Joshua Chadwell, PA-C .  sodium chloride flush (NS) 0.9 % injection 3 mL, 3 mL, Intravenous, Q12H, Leone BrandLaura R Ingold, NP, 3 mL at 05/18/16 1025 .  sodium chloride flush (NS) 0.9 % injection 3 mL, 3 mL, Intravenous, PRN, Leone BrandLaura R Ingold, NP .  sodium phosphate (FLEET) 7-19 GM/118ML enema 1 enema, 1 enema, Rectal, Once PRN, Margart SicklesJoshua Chadwell, PA-C .  traMADol Janean Sark(ULTRAM) tablet 100 mg, 100 mg, Oral, Q6H PRN, Margart SicklesJoshua Chadwell, PA-C, 100 mg at 05/17/16 2204 .  vancomycin (VANCOCIN) 1,500 mg in sodium chloride 0.9 % 500 mL IVPB, 1,500 mg, Intravenous, Q24H, Gerilyn Nestlehuy D Dang, RPH, 1,500 mg at 05/18/16 0350 . sodium chloride 1 mL/kg/hr (05/18/16 0700)      TELE:      NSR  ECG: NSR, ST depression in anterolateral leads.     Current Medications:  . docusate sodium  100 mg Oral QHS  .  guaiFENesin  1,200 mg Oral BID  . insulin aspart  0-20 Units Subcutaneous TID WC  . insulin aspart  0-5 Units Subcutaneous QHS  . insulin aspart  6 Units Subcutaneous TID WC  . levothyroxine  150 mcg Oral QAC breakfast  . mouth rinse  15 mL Mouth Rinse BID  . piperacillin-tazobactam (ZOSYN)  IV  3.375 g Intravenous Q8H  . polyethylene glycol  17 g Oral Daily  . sodium chloride flush  3 mL Intravenous Q12H  . vancomycin  1,500 mg Intravenous Q24H   . sodium chloride 1 mL/kg/hr (05/18/16 0700)    ASSESSMENT AND PLAN: Principal Problem:   HAP (hospital-acquired pneumonia) Active Problems:   Primary localized osteoarthritis of  right knee   Sepsis associated hypotension (HCC)   Acute respiratory failure with hypoxia (HCC)   NSTEMI (non-ST elevated myocardial infarction) (HCC)   Low oxygen saturation  1. Abnormal EKG + Elevated Troponin: patient denies any recent CP. She had a NST 1 month ago, as part of pre-operative assessment, that was negative for ischemia and infarction. EF estimate was normal at 62%. ? If demand ischemia in the setting of recent surgery, perioperative anemia (Hgb 12>>9) and PNA.   No wall motion abnormalities on Echo yesterday, has diastolic dysfunction.   Troponin level is still down trending 1.04; 0.72;. 0.46; 0.39; 0.30   LHC to be done today.   2. PNA:CXR shows right sided PNA. Continue antibiotics per IM.  3. Right Knee OA: s/p right TKR. Continue management per Ortho. Currently NWB. Was on Eliquis for DVT prophylaxis.   4. Perioperative Anemia: Drop in Hgb from 12.9, pre surgery, to 9.4 post surgery.9.9/32.1 today.    Signed, Little Ishikawa , NP 1:16 PM 05/18/2016 Pager [phone]  Patient seen and examined. Agree with assessment and plan. No recurrent chest pain today, but had some yesterday. ECG today with NSR at 77, IRBBB and precordial T wave inversion V1-6. Pt has not had eliquis for 48 hrs.  Plan cath this afternoon.    Lennette Bihari,  MD, [facility] 05/18/2016 1:45 PM

## 2016-05-18 NOTE — H&P (View-Only) (Signed)
Patient Name: Tamara [name] Date of Encounter: 05/18/2016    Primary Cardiologist: Dr. Rennis Golden Patient Profile: 75 year old female with a past medical history of borderline cardiomegaly, dyspnea, palpitations and documented SVT, hypothyroidism, type 2 diabetes and borderline dyslipidemia. Had right TKR on 05/12/16, developed cough found to have HAP, troponin elevated, for heart cath today.   SUBJECTIVE: Feels well, had some chest pain and tightness last night after eating. No chest pain currently.   OBJECTIVE Vitals:   05/18/16 0000 05/18/16 0442 05/18/16 0728 05/18/16 1304  BP: (!) 99/48 (!) 119/97 (!) 115/55 (!) 116/52  Pulse: 87 71 71 78  Resp: (!) 21 18 20  (!) 22  Temp: 99.3 F (37.4 C) 97.8 F (36.6 C) 98 F (36.7 C) 97.8 F (36.6 C)  TempSrc: Oral Oral Oral Oral  SpO2: 90% 94% 100% 93%  Weight:      Height:        Intake/Output Summary (Last 24 hours) at 05/18/16 1316 Last data filed at 05/18/16 1305  Gross per 24 hour  Intake           1820.8 ml  Output             1225 ml  Net            595.8 ml   Filed Weights   05/12/16 0836 05/16/16 0238 05/17/16 2020  Weight: 255 lb (115.7 kg) 270 lb 4.5 oz (122.6 kg) 290 lb 2 oz (131.6 kg)    PHYSICAL EXAM General: Well developed, well nourished, obese female in no acute distress. Head: Normocephalic, atraumatic.  Neck: Supple without bruits, hard to assess JVD. Lungs:  Resp regular and unlabored, CTA. Heart: RRR, S1, S2, no S3, S4, or murmur; no rub. Abdomen: Soft, non-tender, non-distended, BS + x 4.  Extremities: No clubbing, cyanosis, no edema.  Neuro: Alert and oriented X 3. Moves all extremities spontaneously. Psych: Normal affect.  LABS: CBC: Recent Labs  05/17/16 0427 05/18/16 0148  WBC 8.3 8.3  HGB 8.6* 9.9*  HCT 28.6* 32.1*  MCV 89.9 90.7  PLT 207 246   INR: Recent Labs  05/18/16 0148  INR 1.48   Basic Metabolic Panel: Recent Labs  05/17/16 0427 05/18/16 0538  NA 138 139  K 3.6 3.6    CL 110 110  CO2 21* 23  GLUCOSE 105* 92  BUN 23* 15  CREATININE 1.04* 0.91  CALCIUM 7.8* 8.2*  Cardiac Enzymes: Recent Labs  05/16/16 1622 05/16/16 2242 05/17/16 0427  TROPONINI 0.46* 0.39* 0.30*   Fasting Lipid Panel: Recent Labs  05/18/16 0820  CHOL 126  HDL 33*  LDLCALC 75  TRIG 91  CHOLHDL 3.8     Current Facility-Administered Medications:  .  0.9 %  sodium chloride infusion, 250 mL, Intravenous, PRN, Leone Brand, NP .  [EXPIRED] 0.9% sodium chloride infusion, 3 mL/kg/hr, Intravenous, Continuous, Last Rate: 367.8 mL/hr at 05/18/16 0602, 3 mL/kg/hr at 05/18/16 0602 **FOLLOWED BY** 0.9% sodium chloride infusion, 1 mL/kg/hr, Intravenous, Continuous, Leone Brand, NP, Last Rate: 122.6 mL/hr at 05/18/16 0700, 1 mL/kg/hr at 05/18/16 0700 .  acetaminophen (TYLENOL) tablet 650 mg, 650 mg, Oral, Q6H PRN, 650 mg at 05/17/16 2202 **OR** acetaminophen (TYLENOL) suppository 650 mg, 650 mg, Rectal, Q6H PRN, Margart Sickles, PA-C .  bisacodyl (DULCOLAX) EC tablet 5 mg, 5 mg, Oral, Daily PRN, Margart Sickles, PA-C .  docusate sodium (COLACE) capsule 100 mg, 100 mg, Oral, QHS, Joshua Chadwell, PA-C, 100 mg at  05/17/16 2202 .  guaiFENesin (MUCINEX) 12 hr tablet 1,200 mg, 1,200 mg, Oral, BID, Starleen Armsawood S Elgergawy, MD, 1,200 mg at 05/17/16 2202 .  HYDROcodone-acetaminophen (NORCO/VICODIN) 5-325 MG per tablet 1 tablet, 1 tablet, Oral, Q4H PRN, Margart SicklesJoshua Chadwell, PA-C, 1 tablet at 05/18/16 0946 .  insulin aspart (novoLOG) injection 0-20 Units, 0-20 Units, Subcutaneous, TID WC, Margart SicklesJoshua Chadwell, PA-C, 3 Units at 05/17/16 1219 .  insulin aspart (novoLOG) injection 0-5 Units, 0-5 Units, Subcutaneous, QHS, Joshua Chadwell, PA-C .  insulin aspart (novoLOG) injection 6 Units, 6 Units, Subcutaneous, TID WC, Joshua Chadwell, PA-C, 6 Units at 05/17/16 1219 .  levothyroxine (SYNTHROID, LEVOTHROID) tablet 150 mcg, 150 mcg, Oral, QAC breakfast, Margart SicklesJoshua Chadwell, PA-C, 150 mcg at 05/18/16 0837 .  MEDLINE  mouth rinse, 15 mL, Mouth Rinse, BID, Frederico Hammananiel Caffrey, MD, 15 mL at 05/17/16 2216 .  menthol-cetylpyridinium (CEPACOL) lozenge 3 mg, 1 lozenge, Oral, PRN **OR** phenol (CHLORASEPTIC) mouth spray 1 spray, 1 spray, Mouth/Throat, PRN, Joshua Chadwell, PA-C .  metoCLOPramide (REGLAN) tablet 5-10 mg, 5-10 mg, Oral, Q8H PRN **OR** metoCLOPramide (REGLAN) injection 5-10 mg, 5-10 mg, Intravenous, Q8H PRN, Margart SicklesJoshua Chadwell, PA-C, 10 mg at 05/13/16 1454 .  nitroGLYCERIN (NITROSTAT) SL tablet 0.4 mg, 0.4 mg, Sublingual, Q5 min PRN, Joshua Chadwell, PA-C .  ondansetron (ZOFRAN) tablet 4 mg, 4 mg, Oral, Q6H PRN **OR** ondansetron (ZOFRAN) injection 4 mg, 4 mg, Intravenous, Q6H PRN, Margart SicklesJoshua Chadwell, PA-C, 4 mg at 05/16/16 0422 .  piperacillin-tazobactam (ZOSYN) IVPB 3.375 g, 3.375 g, Intravenous, Q8H, Gerilyn Nestlehuy D Dang, RPH, 3.375 g at 05/18/16 0837 .  polyethylene glycol (MIRALAX / GLYCOLAX) packet 17 g, 17 g, Oral, Daily, Zannie CovePreetha Joseph, MD, 17 g at 05/17/16 1005 .  promethazine (PHENERGAN) tablet 12.5 mg, 12.5 mg, Oral, Q6H PRN, Joshua Chadwell, PA-C, 12.5 mg at 05/15/16 1031 .  senna-docusate (Senokot-S) tablet 1 tablet, 1 tablet, Oral, QHS PRN, Joshua Chadwell, PA-C .  sodium chloride flush (NS) 0.9 % injection 3 mL, 3 mL, Intravenous, Q12H, Leone BrandLaura R Ingold, NP, 3 mL at 05/18/16 1025 .  sodium chloride flush (NS) 0.9 % injection 3 mL, 3 mL, Intravenous, PRN, Leone BrandLaura R Ingold, NP .  sodium phosphate (FLEET) 7-19 GM/118ML enema 1 enema, 1 enema, Rectal, Once PRN, Margart SicklesJoshua Chadwell, PA-C .  traMADol Janean Sark(ULTRAM) tablet 100 mg, 100 mg, Oral, Q6H PRN, Margart SicklesJoshua Chadwell, PA-C, 100 mg at 05/17/16 2204 .  vancomycin (VANCOCIN) 1,500 mg in sodium chloride 0.9 % 500 mL IVPB, 1,500 mg, Intravenous, Q24H, Gerilyn Nestlehuy D Dang, RPH, 1,500 mg at 05/18/16 0350 . sodium chloride 1 mL/kg/hr (05/18/16 0700)      TELE:      NSR  ECG: NSR, ST depression in anterolateral leads.     Current Medications:  . docusate sodium  100 mg Oral QHS  .  guaiFENesin  1,200 mg Oral BID  . insulin aspart  0-20 Units Subcutaneous TID WC  . insulin aspart  0-5 Units Subcutaneous QHS  . insulin aspart  6 Units Subcutaneous TID WC  . levothyroxine  150 mcg Oral QAC breakfast  . mouth rinse  15 mL Mouth Rinse BID  . piperacillin-tazobactam (ZOSYN)  IV  3.375 g Intravenous Q8H  . polyethylene glycol  17 g Oral Daily  . sodium chloride flush  3 mL Intravenous Q12H  . vancomycin  1,500 mg Intravenous Q24H   . sodium chloride 1 mL/kg/hr (05/18/16 0700)    ASSESSMENT AND PLAN: Principal Problem:   HAP (hospital-acquired pneumonia) Active Problems:   Primary localized osteoarthritis of  right knee   Sepsis associated hypotension (HCC)   Acute respiratory failure with hypoxia (HCC)   NSTEMI (non-ST elevated myocardial infarction) (HCC)   Low oxygen saturation  1. Abnormal EKG + Elevated Troponin: patient denies any recent CP. She had a NST 1 month ago, as part of pre-operative assessment, that was negative for ischemia and infarction. EF estimate was normal at 62%. ? If demand ischemia in the setting of recent surgery, perioperative anemia (Hgb 12>>9) and PNA.   No wall motion abnormalities on Echo yesterday, has diastolic dysfunction.   Troponin level is still down trending 1.04; 0.72;. 0.46; 0.39; 0.30   LHC to be done today.   2. PNA:CXR shows right sided PNA. Continue antibiotics per IM.  3. Right Knee OA: s/p right TKR. Continue management per Ortho. Currently NWB. Was on Eliquis for DVT prophylaxis.   4. Perioperative Anemia: Drop in Hgb from 12.9, pre surgery, to 9.4 post surgery.9.9/32.1 today.    Signed, Little Ishikawa , NP 1:16 PM 05/18/2016 Pager [phone]  Patient seen and examined. Agree with assessment and plan. No recurrent chest pain today, but had some yesterday. ECG today with NSR at 77, IRBBB and precordial T wave inversion V1-6. Pt has not had eliquis for 48 hrs.  Plan cath this afternoon.    Lennette Bihari,  MD, [facility] 05/18/2016 1:45 PM

## 2016-05-18 NOTE — Progress Notes (Signed)
Physical Therapy Treatment Patient Details Name: Tamara [name] MRN: [id number] DOB: 05/29/1941 Today's Date: 05/18/2016    History of Present Illness 75 yo female with Right knee OA, s/p TKA on 05/12/16.  Resp issues on 9/18 and transfer to ICU.    PT Comments    Pt admitted with above diagnosis. Pt currently with functional limitations due to the deficits listed below (see PT Problem List). Pt was able to ambulate further today but did not self monitor well and had to bring chair to pt as she fatigued prior to getting back to chair.  Continues to need O2 in place as well.  Progressing but slowly.  For CATH this pm.   Pt will benefit from skilled PT to increase their independence and safety with mobility to allow discharge to the venue listed below.    Follow Up Recommendations  SNF;Supervision/Assistance - 24 hour     Equipment Recommendations  None recommended by PT    Recommendations for Other Services       Precautions / Restrictions Precautions Precautions: Fall;Knee Precaution Booklet Issued: Yes (comment) Precaution Comments:  reviewed precautions and no pillow under knee. Required Braces or Orthoses: Knee Immobilizer - Right Knee Immobilizer - Right: On when out of bed or walking Restrictions Weight Bearing Restrictions: Yes RLE Weight Bearing: Weight bearing as tolerated    Mobility  Bed Mobility Overal bed mobility: Needs Assistance Bed Mobility: Supine to Sit     Supine to sit: Mod assist;+2 for safety/equipment;HOB elevated     General bed mobility comments: Used pad to scoot pt to EOB.  Mod assist overall.  Transfers Overall transfer level: Needs assistance Equipment used: Rolling walker (2 wheeled) Transfers: Sit to/from Stand Sit to Stand: Mod assist;+2 physical assistance         General transfer comment: Pt did better with less time to stabilize in standing; cues for hand placement and technique; less assist to power up and to extend hips for  upright posture and for hand placement on RW. Pt still maintains slightly flexed posture.   Ambulation/Gait Ambulation/Gait assistance: Min assist;+2 safety/equipment;Mod assist Ambulation Distance (Feet): 45 Feet Assistive device: Rolling walker (2 wheeled) Gait Pattern/deviations: Step-to pattern;Decreased step length - left;Decreased stance time - right;Decreased weight shift to right;Decreased step length - right;Trunk flexed;Wide base of support Gait velocity: decreased Gait velocity interpretation: Below normal speed for age/gender General Gait Details: Pt continuing to lean forward over RW on forearms with fatigue. Pt able to advance bilateral LEs forward independently. Pt still maintaining very flexed posture.  Pt incr distance considerably however had to bring chair to pt as she fatigued prior to getting back to chair and could not go further.  Needs cues for self monitoring fatigue.    Stairs            Wheelchair Mobility    Modified Rankin (Stroke Patients Only)       Balance Overall balance assessment: Needs assistance Sitting-balance support: No upper extremity supported;Feet supported Sitting balance-Leahy Scale: Fair     Standing balance support: Bilateral upper extremity supported;During functional activity Standing balance-Leahy Scale: Poor Standing balance comment: still heavily reliant on RW                     Cognition Arousal/Alertness: Awake/alert Behavior During Therapy: WFL for tasks assessed/performed Overall Cognitive Status: Within Functional Limits for tasks assessed  Exercises Total Joint Exercises Ankle Circles/Pumps: Both;10 reps;Supine Quad Sets: Both;10 reps;Supine Heel Slides: AAROM;Right;10 reps;Seated Hip ABduction/ADduction: Right;10 reps;Supine;AAROM Straight Leg Raises: AAROM;Both;10 reps;Supine Long Arc Quad: AAROM;Right;10 reps;Seated Knee Flexion: AAROM;Right;10 reps;Seated Goniometric  ROM: 3-69 right knee    General Comments        Pertinent Vitals/Pain Pain Assessment: 0-10 Pain Score: 7  Pain Location: right knee Pain Descriptors / Indicators: Grimacing;Guarding;Sore Pain Intervention(s): Limited activity within patient's tolerance;Monitored during session;Premedicated before session;Repositioned;Ice applied  77-101 bpm, 96% on 2LO2, had to incr to 4L with activity to keep sats >92%.      Home Living                      Prior Function            PT Goals (current goals can now be found in the care plan section) Progress towards PT goals: Progressing toward goals    Frequency    7X/week      PT Plan Current plan remains appropriate    Co-evaluation             End of Session Equipment Utilized During Treatment: Gait belt;Oxygen;Right knee immobilizer Activity Tolerance: Patient limited by pain;Patient limited by fatigue Patient left: in chair;with call bell/phone within reach     Time: 0931-1003 PT Time Calculation (min) (ACUTE ONLY): 32 min  Charges:  $Gait Training: 8-22 mins $Therapeutic Exercise: 8-22 mins                    G Codes:      Berline Lopes 06/01/16, 1:49 PM [employer] Acute [facility] [phone] (pager)

## 2016-05-18 NOTE — Progress Notes (Signed)
Orthopedic Tech Progress Note Patient Details:  Tamara [name] Lepera 07/22/1941 [id number] Ortho visit put on cpm at 2010. Patient ID: Tamara [name] Pain, female   DOB: 07/22/1941, 75 y.o.   MRN: [id number]   Jennye MoccasinHughes, Tanyia Grabbe Craig 05/18/2016, 8:09 PM

## 2016-05-18 NOTE — Progress Notes (Signed)
RT set up CPAP and placed on patient. Patient resting comfortably at this time. RT will continue to monitor as needed.

## 2016-05-19 ENCOUNTER — Encounter (HOSPITAL_COMMUNITY): Payer: Self-pay | Admitting: Cardiovascular Disease

## 2016-05-19 LAB — GLUCOSE, CAPILLARY
GLUCOSE-CAPILLARY: 106 mg/dL — AB (ref 65–99)
Glucose-Capillary: 98 mg/dL (ref 65–99)
Glucose-Capillary: 92 mg/dL (ref 65–99)
Glucose-Capillary: 110 mg/dL — ABNORMAL HIGH (ref 65–99)

## 2016-05-19 LAB — RNA QUALITATIVE: HIV 1 RNA Qualitative: 1

## 2016-05-19 LAB — HIV ANTIBODY (ROUTINE TESTING W REFLEX)

## 2016-05-19 LAB — BASIC METABOLIC PANEL
Anion gap: 8 (ref 5–15)
BUN: 12 mg/dL (ref 6–20)
CALCIUM: 8.4 mg/dL — AB (ref 8.9–10.3)
CO2: 23 mmol/L (ref 22–32)
CREATININE: 0.82 mg/dL (ref 0.44–1.00)
Chloride: 107 mmol/L (ref 101–111)
GFR calc Af Amer: >60 mL/min (ref >60)
GLUCOSE: 96 mg/dL (ref 65–99)
Potassium: 3.4 mmol/L — ABNORMAL LOW (ref 3.5–5.1)
Sodium: 138 mmol/L (ref 135–145)

## 2016-05-19 LAB — HEMOGLOBIN A1C
Hgb A1c MFr Bld: 6.1 % — ABNORMAL HIGH (ref 4.8–5.6)
Mean Plasma Glucose: 128 mg/dL

## 2016-05-19 LAB — CBC
HEMATOCRIT: 32.3 % — AB (ref 36.0–46.0)
Hemoglobin: 10.1 g/dL — ABNORMAL LOW (ref 12.0–15.0)
MCH: 27.7 pg (ref 26.0–34.0)
MCHC: 31.3 g/dL (ref 30.0–36.0)
MCV: 88.7 fL (ref 78.0–100.0)
PLATELETS: 271 10*3/uL (ref 150–400)
RBC: 3.64 MIL/uL — ABNORMAL LOW (ref 3.87–5.11)
RDW: 15.5 % (ref 11.5–15.5)
WBC: 8 10*3/uL (ref 4.0–10.5)

## 2016-05-19 LAB — HIV 1/2 AB DIFFERENTIATION
HIV 1 AB: NEGATIVE
HIV 2 Ab: NEGATIVE

## 2016-05-19 MED ORDER — LEVOFLOXACIN 500 MG PO TABS: 500.0000 mg | ORAL_TABLET | Freq: Every day | ORAL | 0 refills | Status: DC

## 2016-05-19 MED ORDER — APIXABAN 2.5 MG PO TABS
2.5000 mg | ORAL_TABLET | Freq: Two times a day (BID) | ORAL | Status: DC
  Administered 2016-05-19 – 2016-05-20 (×3): 2.5 mg via ORAL
  Filled 2016-05-19 (×3): qty 1

## 2016-05-19 MED ORDER — LEVOFLOXACIN 750 MG PO TABS
750.0000 mg | ORAL_TABLET | Freq: Every day | ORAL | Status: DC
  Administered 2016-05-19 – 2016-05-20 (×2): 750 mg via ORAL
  Filled 2016-05-19 (×2): qty 1

## 2016-05-19 NOTE — Progress Notes (Signed)
Subjective: 6 days s/p right total knee arthroplasty POD#1 heart cath Patient reports pain as mild and moderate.    Objective: Vital signs in last 24 hours: Temp:  [97.8 F (36.6 C)-98.7 F (37.1 C)] 98.6 F (37 C) (09/22 1216) Pulse Rate:  [0-87] 81 (09/22 1216) Resp:  [0-68] 18 (09/22 1216) BP: (101-144)/(52-77) 104/55 (09/22 1216) SpO2:  [0 %-99 %] 90 % (09/22 1216) Weight:  [125.7 kg (277 lb 1.6 oz)-126 kg (277 lb 12.8 oz)] 125.7 kg (277 lb 1.6 oz) (09/22 0444)  Intake/Output from previous day: 09/21 0701 - 09/22 0700 In: 1408.2 [I.V.:1308.2; IV Piggyback:100] Out: 2050 [Urine:2050] Intake/Output this shift: Total I/O In: 120 [P.O.:120] Out: 950 [Urine:950]   Recent Labs  05/17/16 0427 05/18/16 0148 05/19/16 0309  HGB 8.6* 9.9* 10.1*    Recent Labs  05/18/16 0148 05/19/16 0309  WBC 8.3 8.0  RBC 3.54* 3.64*  HCT 32.1* 32.3*  PLT 246 271    Recent Labs  05/18/16 0538 05/19/16 0309  NA 139 138  K 3.6 3.4*  CL 110 107  CO2 23 23  BUN 15 12  CREATININE 0.91 0.82  GLUCOSE 92 96  CALCIUM 8.2* 8.4*    Recent Labs  05/18/16 0148  INR 1.48    Neurovascular intact Dorsiflexion/Plantar flexion intact Incision: dressing C/D/I dressing changed   Assessment/Plan: 6 days s/p right total knee arthroplasty 1 Day Post-Op Procedure(s) (LRB): Left Heart Cath and Coronary Angiography (N/A) Up with therapy  Continue CPM as ordered DVT proph eliquis Pain control as ordered dsg change prn  HAP- abx per IM, need to touch base on dispo possible date and abx rx needed, plan is still for SNF following.    Margart SicklesChadwell, Queena Monrreal 05/19/2016, 1:01 PM

## 2016-05-19 NOTE — Progress Notes (Signed)
Orthopedic Tech Progress Note Patient Details:  Donalee Citrindna H Northcraft 12-Jun-1941 [id number] Ortho visit put on cpm at 1830 Patient ID: Donalee CitrinEdna H Quinteros, female   DOB: 12-Jun-1941, 75 y.o.   MRN: [id number]   Jennye MoccasinHughes, Jorgeluis Gurganus Craig 05/19/2016, 6:40 PM

## 2016-05-19 NOTE — Progress Notes (Addendum)
Consult follow up  PROGRESS NOTE                                                                                                                                                                                                             Patient Demographics:    Tamara [name], is a 75 y.o. female, DOB - 1940-12-17, ZOX:[id number]  Admit date - 05/12/2016   Admitting Physician Frederico Hammananiel Caffrey, MD  Outpatient Primary MD for the patient is Alan MulderMORAYATI,SHAMIL J, MD LOS - 7 No chief complaint on file.     Brief Narrative   Tamara [name] is an 75 y.o. female with H/o DM, Hypothyroidism was admitted to hospital 5 days ago for TKA, underwent surgery 9/15 per Dr.Caffrey, post op she developed productive cough and congestion, mild tachycardia and hypotension. TRH was consulted,  Tachycardia and hypotension are improving with ongoing IVF.  CXR demonstrates RLL PNA. She was on Eliquis for DVT prophylaxis. She was also noted to have Troponin of 1.04 and Abnormal EKG-new T wave changes and hence Cards was consulted, plan for LHC on Thursday   Subjective:    Tamara [name] Pt has no new complaints. No acute issues overnight.   Assessment  & Plan :    Acute hypoxic respiratory failure - This is related to HCAP, low probability for PE given patient has been on Eliquis post op - continue Rx as below, improving  HCAP - transition to levaquin - incentive spirometry, OOB, pulm toilet  NSTEMI -post op, in setting of hypotension, infection and post op anemia -pre op had a negative stress test -Place back on Eliquis -Troponin trending down, no chest pain, no further intervention planned by cardiology, they will sign off today.  Acute post op blood loss anemia - s/p 1 unit PRBC - will resume eliquis  Hypotension - resolved.  Diabetes mellitus - hold oral hypoglycemic agent, continue with insulin sliding scale  Hypothyroidism - Continue levothyroxine  Status post  right TKA - per primary orthopedic team - lovenox for DVT prophylaxis x1 dose today  Code Status : Full Family Communication  : daughter at bedside Disposition Plan  :  Addendum: Pt is cleared for discharge on oral antibiotic from our end. She would need 3 more days of antibiotics  DVT Prophylaxis : Eliquis   Lab Results  Component Value Date  PLT 271 05/19/2016    Antibiotics  :   Anti-infectives    Start     Dose/Rate Route Frequency Ordered Stop   05/17/16 0600  vancomycin (VANCOCIN) IVPB 750 mg/150 ml premix  Status:  Discontinued     750 mg 150 mL/hr over 60 Minutes Intravenous Every 24 hours 05/16/16 0308 05/16/16 0746   05/17/16 0400  vancomycin (VANCOCIN) 1,500 mg in sodium chloride 0.9 % 500 mL IVPB     1,500 mg 250 mL/hr over 120 Minutes Intravenous Every 24 hours 05/16/16 0746 05/23/16 2359   05/16/16 1200  ceFEPIme (MAXIPIME) 1 g in dextrose 5 % 50 mL IVPB  Status:  Discontinued    Comments:  Cefepime 1 g IV q12h for CrCl < 60 mL/min   1 g 100 mL/hr over 30 Minutes Intravenous 2 times daily 05/16/16 0310 05/16/16 0720   05/16/16 0800  piperacillin-tazobactam (ZOSYN) IVPB 3.375 g     3.375 g 12.5 mL/hr over 240 Minutes Intravenous Every 8 hours 05/16/16 0746     05/16/16 0230  vancomycin (VANCOCIN) 1,500 mg in sodium chloride 0.9 % 500 mL IVPB     1,500 mg 250 mL/hr over 120 Minutes Intravenous  Once 05/16/16 0212 05/16/16 0556   05/16/16 0200  ceFEPIme (MAXIPIME) 1 g in dextrose 5 % 50 mL IVPB  Status:  Discontinued     1 g 100 mL/hr over 30 Minutes Intravenous Every 8 hours 05/16/16 0113 05/16/16 0310   05/16/16 0030  doxycycline (VIBRAMYCIN) 100 mg in dextrose 5 % 250 mL IVPB  Status:  Discontinued     100 mg 125 mL/hr over 120 Minutes Intravenous Every 12 hours 05/16/16 0000 05/16/16 0112   05/12/16 1630  ceFAZolin (ANCEF) IVPB 2g/100 mL premix     2 g 200 mL/hr over 30 Minutes Intravenous Every 6 hours 05/12/16 1445 05/12/16 2349   05/12/16 1000  ceFAZolin  (ANCEF) 3 g in dextrose 5 % 50 mL IVPB     3 g 130 mL/hr over 30 Minutes Intravenous To [facility] 05/11/16 0828 05/12/16 1056        Objective:   Vitals:   05/18/16 2346 05/19/16 0444 05/19/16 0807 05/19/16 1216  BP: 101/71 140/65 (!) 130/56 (!) 104/55  Pulse: 86 87 84 81  Resp: 18 18 17 18   Temp:  98.7 F (37.1 C) 98.6 F (37 C) 98.6 F (37 C)  TempSrc:  Oral Oral Oral  SpO2: 91% 92% 94% 90%  Weight:  125.7 kg (277 lb 1.6 oz)    Height:        Wt Readings from Last 3 Encounters:  05/19/16 125.7 kg (277 lb 1.6 oz)  05/02/16 115.8 kg (255 lb 4.8 oz)  03/16/16 115.2 kg (254 lb)     Intake/Output Summary (Last 24 hours) at 05/19/16 1337 Last data filed at 05/19/16 1125  Gross per 24 hour  Intake           1160.4 ml  Output             2700 ml  Net          -1539.6 ml     Physical Exam  Awake Alert, Oriented X 3,  Supple Neck,No JVD Symmetrical Chest wall movement, Good air movement bilaterally, no wheezing RRR,No Gallops,Rubs or new Murmurs, No Parasternal Heave +ve B.Sounds, Abd Soft, No tenderness, no guarding No Cyanosis, Clubbing or edema, Right knee bandaged    Data Review:    CBC  Recent Labs  Lab 05/15/16 0302 05/16/16 0156 05/17/16 0427 05/18/16 0148 05/19/16 0309  WBC 13.6* 11.6* 8.3 8.3 8.0  HGB 10.2* 9.4* 8.6* 9.9* 10.1*  HCT 33.4* 30.7* 28.6* 32.1* 32.3*  PLT 230 217 207 246 271  MCV 89.8 89.5 89.9 90.7 88.7  MCH 27.4 27.4 27.0 28.0 27.7  MCHC 30.5 30.6 30.1 30.8 31.3  RDW 15.2 15.3 15.7* 15.7* 15.5    Chemistries   Recent Labs Lab 05/13/16 0340 05/16/16 0156 05/17/16 0427 05/18/16 0538 05/19/16 0309  NA 137 135 138 139 138  K 4.8 3.9 3.6 3.6 3.4*  CL 103 104 110 110 107  CO2 23 21* 21* 23 23  GLUCOSE 130* 125* 105* 92 96  BUN 17 33* 23* 15 12  CREATININE 1.13* 1.30* 1.04* 0.91 0.82  CALCIUM 8.9 8.0* 7.8* 8.2* 8.4*    ------------------------------------------------------------------------------------------------------------------  Recent Labs  05/18/16 0820  CHOL 126  HDL 33*  LDLCALC 75  TRIG 91  CHOLHDL 3.8    Lab Results  Component Value Date   HGBA1C 6.1 (H) 05/18/2016   ------------------------------------------------------------------------------------------------------------------ No results for input(s): TSH, T4TOTAL, T3FREE, THYROIDAB in the last 72 hours.  Invalid input(s): FREET3 ------------------------------------------------------------------------------------------------------------------ No results for input(s): VITAMINB12, FOLATE, FERRITIN, TIBC, IRON, RETICCTPCT in the last 72 hours.  Coagulation profile  Recent Labs Lab 05/16/16 0156 05/18/16 0148  INR 2.06 1.48    No results for input(s): DDIMER in the last 72 hours.  Cardiac Enzymes  Recent Labs Lab 05/16/16 1622 05/16/16 2242 05/17/16 0427  TROPONINI 0.46* 0.39* 0.30*   ------------------------------------------------------------------------------------------------------------------ No results found for: BNP  Inpatient Medications  Scheduled Meds: . docusate sodium  100 mg Oral QHS  . guaiFENesin  1,200 mg Oral BID  . insulin aspart  0-20 Units Subcutaneous TID WC  . insulin aspart  0-5 Units Subcutaneous QHS  . insulin aspart  6 Units Subcutaneous TID WC  . levothyroxine  150 mcg Oral QAC breakfast  . mouth rinse  15 mL Mouth Rinse BID  . piperacillin-tazobactam (ZOSYN)  IV  3.375 g Intravenous Q8H  . polyethylene glycol  17 g Oral Daily  . sodium chloride flush  3 mL Intravenous Q12H  . vancomycin  1,500 mg Intravenous Q24H   Continuous Infusions:   PRN Meds:.sodium chloride, acetaminophen **OR** acetaminophen, bisacodyl, HYDROcodone-acetaminophen, menthol-cetylpyridinium **OR** phenol, metoCLOPramide **OR** metoCLOPramide (REGLAN) injection, nitroGLYCERIN, ondansetron **OR** ondansetron  (ZOFRAN) IV, promethazine, senna-docusate, sodium chloride flush, sodium phosphate, traMADol  Micro Results Recent Results (from the past 240 hour(s))  Culture, blood (routine x 2) Call MD if unable to obtain prior to antibiotics being given     Status: None (Preliminary result)   Collection Time: 05/16/16  1:36 AM  Result Value Ref Range Status   Specimen Description BLOOD RIGHT HAND  Final   Special Requests IN PEDIATRIC BOTTLE 2CC  Final   Culture NO GROWTH 2 DAYS  Final   Report Status PENDING  Incomplete  Culture, blood (routine x 2) Call MD if unable to obtain prior to antibiotics being given     Status: None (Preliminary result)   Collection Time: 05/16/16  1:40 AM  Result Value Ref Range Status   Specimen Description BLOOD LEFT ANTECUBITAL  Final   Special Requests BOTTLES DRAWN AEROBIC AND ANAEROBIC 5CC  Final   Culture NO GROWTH 2 DAYS  Final   Report Status PENDING  Incomplete    Radiology Reports Dg Chest 2 View  Result Date: 05/02/2016 CLINICAL DATA:  Preop for knee replacement. EXAM: CHEST  2 VIEW COMPARISON:  11/20/2015 FINDINGS: Linear areas of scarring in the mid and lower lungs bilaterally. Heart is normal size. No confluent opacities or effusions. No acute bony abnormality. IMPRESSION: Areas of scarring in the lungs bilaterally.  No active disease. Electronically Signed   By: Charlett Nose M.D.   On: 05/02/2016 12:32   Dg Chest Port 1 View  Result Date: 05/15/2016 CLINICAL DATA:  Decreased oxygen saturation. Low blood pressure. History of CHF, hypertension, asthma. EXAM: PORTABLE CHEST 1 VIEW COMPARISON:  05/02/2016 FINDINGS: Increasing linear infiltrates and volume loss in the right mid and lower lungs likely represent developing area of pneumonia. Normal heart size and pulmonary vascularity. No blunting of costophrenic angles. No pneumothorax. Calcification of the aorta. Degenerative changes in the spine and shoulders. IMPRESSION: Increasing linear infiltrates and volume  loss in the right mid and lower lungs may represent developing area of pneumonia. Electronically Signed   By: Burman Nieves M.D.   On: 05/15/2016 22:16    Penny Pia M.D on 05/19/2016 at 1:37 PM  Between 7am to 7pm - Pager [phone]  After 7pm go to [email or web address] - password [facility]  [facility] -  Office  [phone]

## 2016-05-19 NOTE — Progress Notes (Signed)
Physical Therapy Treatment Patient Details Name: Tamara [name] MRN: [id number] DOB: 07-11-1941 Today's Date: 05/19/2016    History of Present Illness 75 yo female with Right knee OA, s/p TKA on 05/12/16.  Resp issues on 9/18 and transfer to ICU.    PT Comments    Pt continues to show good progress. Decreased assist required for all mobility this session. Pt fatigues quickly.  Follow Up Recommendations  SNF;Supervision/Assistance - 24 hour     Equipment Recommendations  None recommended by PT    Recommendations for Other Services       Precautions / Restrictions Precautions Precautions: Fall;Knee Precaution Comments:  reviewed precautions and no pillow under knee. Required Braces or Orthoses: Knee Immobilizer - Right Knee Immobilizer - Right: On when out of bed or walking Restrictions RLE Weight Bearing: Weight bearing as tolerated    Mobility  Bed Mobility     Rolling: Modified independent (Device/Increase time)   Supine to sit: Min guard;HOB elevated     General bed mobility comments: +rail, increased time to complete at min guard assist level  Transfers   Equipment used: Rolling walker (2 wheeled)   Sit to Stand: Mod assist Stand pivot transfers: Mod assist;+2 safety/equipment       General transfer comment: verbal cues for hand placement. Assist to power up. Increased time to complete.  Ambulation/Gait Ambulation/Gait assistance: +2 safety/equipment;Min assist Ambulation Distance (Feet): 20 Feet Assistive device: Rolling walker (2 wheeled) Gait Pattern/deviations: Step-to pattern;Decreased stride length;Antalgic;Trunk flexed Gait velocity: decreased Gait velocity interpretation: Below normal speed for age/gender General Gait Details: Pt fatigues quickly. 2 standing rest breaks needed, then chair was brought to pt.   Stairs            Wheelchair Mobility    Modified Rankin (Stroke Patients Only)       Balance                                     Cognition Arousal/Alertness: Awake/alert Behavior During Therapy: WFL for tasks assessed/performed Overall Cognitive Status: Within Functional Limits for tasks assessed                      Exercises Total Joint Exercises Ankle Circles/Pumps: AROM;Both;10 reps;Supine Quad Sets: AROM;Right;Supine Short Arc Quad: AROM;Right;10 reps;Supine Heel Slides: AAROM;Right;10 reps;Supine Hip ABduction/ADduction: AROM;Right;10 reps;Supine Goniometric ROM: 0-75 R knee    General Comments        Pertinent Vitals/Pain Pain Assessment: 0-10 Pain Score: 2  Pain Location: R knee Pain Descriptors / Indicators: Sore Pain Intervention(s): Monitored during session;Repositioned    Home Living                      Prior Function            PT Goals (current goals can now be found in the care plan section)      Frequency    7X/week      PT Plan Current plan remains appropriate    Co-evaluation             End of Session Equipment Utilized During Treatment: Gait belt;Right knee immobilizer Activity Tolerance: Patient limited by fatigue Patient left: in chair;with nursing/sitter in room;with call bell/phone within reach     Time: [id number] PT Time Calculation (min) (ACUTE ONLY): 29 min  Charges:  $Gait Training: 8-22 mins $Therapeutic Exercise: 8-22 mins  G Codes:      Ilda Foil 05/19/2016, 9:21 AM

## 2016-05-19 NOTE — Progress Notes (Signed)
Orthopedic Tech Progress Note Patient Details:  Tamara [name] 19-Nov-1940 [id number] Took cpm at 2020 Patient ID: Tamara [name], female   DOB: 19-Nov-1940, 75 y.o.   MRN: [id number]   Jennye MoccasinHughes, Choua Chalker Craig 05/19/2016, 8:20 PM

## 2016-05-19 NOTE — Clinical Social Work Note (Addendum)
CSW called and left message with secretary at MD's office inquiring about anticipated discharge date. Per previous CSW, SNF needs discharge summary today if patient discharging over the weekend.  Dayton Scrape, CSW [phone]  3:47 pm CSW spoke with PA and MD regarding disposition plan. Both agreeable that patient will be stable for discharge tomorrow. They will get discharge summary entered today so that SNF can have time to review and get medications. CSW met with patient and her daughter. Both were notified and agreeable to plan. Weekend CSW notified. CSW called and left voicemail with admissions coordinator at [facility].  Dayton Scrape, [place]

## 2016-05-19 NOTE — Progress Notes (Signed)
Physical Therapy Treatment Patient Details Name: Tamara [name] MRN: [id number] DOB: June 28, 1941 Today's Date: 05/19/2016    History of Present Illness 75 yo female with Right knee OA, s/p TKA on 05/12/16.  Resp issues on 9/18 and transfer to ICU.    PT Comments    Pt reporting increased pain and fatigue from sitting up in recliner. Unable to attempt further ambulation at this time. Pt assisted to [facility]. She reports not having had a BM since sx. She is requesting to remain seated on [facility] for increased time to hopefully have a BM. RN aware and will assist pt from [facility] when ready. Pt is very motivated to participate with PT but continues to fatigue quickly.  Follow Up Recommendations  SNF;Supervision/Assistance - 24 hour     Equipment Recommendations  None recommended by PT    Recommendations for Other Services       Precautions / Restrictions Precautions Precautions: Fall;Knee Precaution Comments:  reviewed precautions and no pillow under knee. Required Braces or Orthoses: Knee Immobilizer - Right Knee Immobilizer - Right: On when out of bed or walking Restrictions RLE Weight Bearing: Weight bearing as tolerated    Mobility  Bed Mobility     Rolling: Modified independent (Device/Increase time)   Supine to sit: Min guard;HOB elevated     General bed mobility comments: +rail, increased time to complete at min guard assist level  Transfers   Equipment used: Rolling walker (2 wheeled)   Sit to Stand: Min assist Stand pivot transfers: Min assist       General transfer comment: Pt demo propre hand placement and good technique.  Ambulation/Gait Ambulation/Gait assistance: +2 safety/equipment;Min assist Ambulation Distance (Feet): 20 Feet Assistive device: Rolling walker (2 wheeled) Gait Pattern/deviations: Step-to pattern;Decreased stride length;Antalgic;Trunk flexed Gait velocity: decreased Gait velocity interpretation: Below normal speed for age/gender General Gait  Details: Pt fatigues quickly. 2 standing rest breaks needed, then chair was brought to pt.   Stairs            Wheelchair Mobility    Modified Rankin (Stroke Patients Only)       Balance                                    Cognition Arousal/Alertness: Awake/alert Behavior During Therapy: WFL for tasks assessed/performed Overall Cognitive Status: Within Functional Limits for tasks assessed                      Exercises Total Joint Exercises Ankle Circles/Pumps: AROM;Both;10 reps;Supine Quad Sets: AROM;Right;Supine Short Arc Quad: AROM;Right;10 reps;Supine Heel Slides: AAROM;Right;10 reps;Supine Hip ABduction/ADduction: AROM;Right;10 reps;Supine Goniometric ROM: 0-75 R knee    General Comments        Pertinent Vitals/Pain Pain Assessment: 0-10 Pain Score: 6  Pain Location: R knee Pain Descriptors / Indicators: Sore Pain Intervention(s): RN gave pain meds during session;Repositioned;Limited activity within patient's tolerance    Home Living                      Prior Function            PT Goals (current goals can now be found in the care plan section) Acute Rehab PT Goals 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 PT Goal Formulation: With patient Time For Goal Achievement: 05/27/16 Potential to Achieve Goals: Good Progress towards PT goals: Progressing toward goals  Frequency    7X/week      PT Plan Current plan remains appropriate    Co-evaluation             End of Session Equipment Utilized During Treatment: Gait belt;Right knee immobilizer Activity Tolerance: Patient limited by fatigue Patient left: Other (comment);[facility] call bell[facility]phone [facility]     Time: [id number] PT Time Calculation (min) (ACUTE ONLY): 11 min  Charges:  $Gait Training: 8-22 mins $Therapeutic Exercise: 8-22 mins $Therapeutic Activity: 8-22 mins                    G Codes:      Ilda FoilGarrow, Gethsemane Fischler  Rene 05/19/2016, 11:40 AM

## 2016-05-19 NOTE — Progress Notes (Signed)
Patient Name: Tamara [name] H Karen Date of Encounter: 05/19/2016  [facility]  Hospital Problem List     Principal Problem:   HAP (hospital-acquired pneumonia) Active Problems:   Primary localized osteoarthritis of right knee   Sepsis associated hypotension (HCC)   Acute respiratory failure with hypoxia (HCC)   NSTEMI (non-ST elevated myocardial infarction) (HCC)   Low oxygen saturation   Elevated troponin     Subjective  Feels well, has some pain in her knee from the surgery. No chest pain or SOB.   Inpatient Medications    Scheduled Meds: . docusate sodium  100 mg Oral QHS  . guaiFENesin  1,200 mg Oral BID  . insulin aspart  0-20 Units Subcutaneous TID WC  . insulin aspart  0-5 Units Subcutaneous QHS  . insulin aspart  6 Units Subcutaneous TID WC  . levothyroxine  150 mcg Oral QAC breakfast  . mouth rinse  15 mL Mouth Rinse BID  . piperacillin-tazobactam (ZOSYN)  IV  3.375 g Intravenous Q8H  . polyethylene glycol  17 g Oral Daily  . sodium chloride flush  3 mL Intravenous Q12H  . vancomycin  1,500 mg Intravenous Q24H   Continuous Infusions:  PRN Meds:.sodium chloride, acetaminophen **OR** acetaminophen, bisacodyl, HYDROcodone-acetaminophen, menthol-cetylpyridinium **OR** phenol, metoCLOPramide **OR** metoCLOPramide (REGLAN) injection, nitroGLYCERIN, ondansetron **OR** ondansetron (ZOFRAN) IV, promethazine, senna-docusate, sodium chloride flush, sodium phosphate, traMADol   Vital Signs    Vitals:   05/18/16 2313 05/18/16 2346 05/19/16 0444 05/19/16 0807  BP:  101/71 140/65 (!) 130/56  Pulse:  86 87 84  Resp: 16 18 18 17   Temp:   98.7 F (37.1 C) 98.6 F (37 C)  TempSrc:   Oral Oral  SpO2: 93% 91% 92% 94%  Weight:   277 lb 1.6 oz (125.7 kg)   Height:        Intake/Output Summary (Last 24 hours) at 05/19/16 0929 Last data filed at 05/19/16 0928  Gross per 24 hour  Intake           1528.2 ml  Output             2550 ml  Net          -1021.8  ml   Filed Weights   05/17/16 2020 05/18/16 1806 05/19/16 0444  Weight: 290 lb 2 oz (131.6 kg) 277 lb 12.8 oz (126 kg) 277 lb 1.6 oz (125.7 kg)    Physical Exam   GEN: Well nourished, well developed, obese female in no acute distress.  HEENT: Grossly normal.  Neck: Supple, no JVD, carotid bruits, or masses. Cardiac: RRR, no murmurs, rubs, or gallops. No clubbing, cyanosis, edema.  Radials/DP/PT 2+ and equal bilaterally.  Respiratory:  Respirations regular and unlabored, clear to auscultation bilaterally. GI: Soft, nontender, nondistended, BS + x 4. MS: no deformity or atrophy. Skin: warm and dry, no rash. Neuro:  Strength and sensation are intact. Psych: AAOx3.  Normal affect.  Labs    CBC  Recent Labs  05/18/16 0148 05/19/16 0309  WBC 8.3 8.0  HGB 9.9* 10.1*  HCT 32.1* 32.3*  MCV 90.7 88.7  PLT 246 271   Basic Metabolic Panel  Recent Labs  05/18/16 0538 05/19/16 0309  NA 139 138  K 3.6 3.4*  CL 110 107  CO2 23 23  GLUCOSE 92 96  BUN 15 12  CREATININE 0.91 0.82  CALCIUM 8.2* 8.4*  Cardiac Enzymes  Recent Labs  05/16/16 1622 05/16/16 2242 05/17/16 0427  TROPONINI 0.46*  0.39* 0.30*   Hemoglobin A1C  Recent Labs  05/18/16 0820  HGBA1C 6.1*   Fasting Lipid Panel  Recent Labs  05/18/16 0820  CHOL 126  HDL 33*  LDLCALC 75  TRIG 91  CHOLHDL 3.8     Telemetry    NSR - Personally Reviewed  ECG    NSR, T wave inversion in anterolateral leads - Personally Reviewed   Cardiac Studies  Left Heart Cath and Coronary Angiography 05/18/16 1. No angiographic evidence of coronary artery disease 2. Elevated troponin likely due to demand ischemia in setting of pneumonia  Recommendations: No further ischemic workup is indicated.    Patient Profile   75 year old female with a past medical history of borderline cardiomegaly, dyspnea, palpitations and documented SVT, hypothyroidism, type 2 diabetes and borderline dyslipidemia. Had right TKR on  05/12/16, developed cough found to have HAP, troponin elevated, and had abnormal EKG, cath preformed yesterday, no CAD.   Assessment & Plan  1. Abnormal EKG + Elevated Troponin: patient denies any recent CP. She had a NST 1 month ago, as part of pre-operative assessment, that was negative for ischemia and infarction. EF estimate was normal at 62%. ? If demand ischemia in the setting of recent surgery, perioperative anemia (Hgb 12>>9) and PNA.   No wall motion abnormalities on Echo yesterday, has diastolic dysfunction.   Troponin level is still down trending 1.04; 0.72;. 0.46; 0.39; 0.30   LHC showed no CAD.   2. PNA:CXR shows right sided PNA. Continue antibiotics per IM.  3. Right Knee OA: s/p right TKR. Continue management per Ortho. Currently NWB. Was onEliquis for DVT prophylaxis.   4. Perioperative Anemia: Resolved  Signed, Little Ishikawa, NP  05/19/2016, 9:29 AM   Patient seen and examined. Agree with assessment and plan. Cath data reviewed; no significant CAD. Persistent T wave changes and mild troponin elevation probably demand ischemia contributed by pneumonia. R radial site with minimal ecchymosis. No chest pain or dyspnea. Will sign off.   Lennette Bihari, MD, [facility] 05/19/2016 12:15 PM

## 2016-05-19 NOTE — Discharge Summary (Signed)
PATIENT ID: Tamara [name]        MRN:  [id number]          DOB/AGE: 75/20/1942 / 74 y.o.    DISCHARGE SUMMARY  ADMISSION DATE:    05/12/2016 DISCHARGE DATE:   05/20/2016  ADMISSION DIAGNOSIS: OA RIGHT KNEE   DISCHARGE DIAGNOSIS:  OA RIGHT KNEE NSTEMI   Hospital acquired pneumonia Acute blood loss anemia  ADDITIONAL DIAGNOSIS: Principal Problem:   HAP (hospital-acquired pneumonia) Active Problems:   Primary localized osteoarthritis of right knee   Sepsis associated hypotension (HCC)   Acute respiratory failure with hypoxia (HCC)   NSTEMI (non-ST elevated myocardial infarction) (HCC)   Low oxygen saturation   Elevated troponin  Past Medical History:  Diagnosis Date  . Anemia    distant hx of  . Arthritis    KNEES,HANDS,WRIST  . Asthma    allergy related  . 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 Bronchitis   . Cardiomegaly   . CHF (congestive heart failure) (HCC)    stopped using diuretics due to kidney issues, watching salt intake  . Chronic cough   . Chronic kidney disease    due to DM  . Complication of anesthesia    BP bottomed out during Achilles Tendon repair >63yrs ago  . Constipation due to pain medication   . Diabetes mellitus without complication (HCC)    type 2  . Diverticulitis   . Gout   . Hepatitis    childhood Hep A 75 yrs old  . History of blood transfusion   . Hypertension   . Hypothyroidism   . 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 Obesity   . Palpitations   . Pneumonia    hx of- 05/02/16- last time approx - 6 years ago  . Shortness of breath dyspnea    with exertion  . SVT (supraventricular tachycardia) (HCC)   . Wheezing     PROCEDURE: Procedure(s): Right total knee arthroplasty 05/12/2016 Left Heart Cath and Coronary Angiography  on 05/18/2016  CONSULTS: PT, OT, SW, Internal Medicine, Cardiology, respiratory therapy    HISTORY:  See H&P in chart  HOSPITAL COURSE:  [name] is a 75  y.o. admitted on 05/12/2016 and found to have a diagnosis of Right knee OA.  After appropriate laboratory studies were obtained  they were taken to the operating room on 05/12/2016 and underwent  Procedure(s): Right total knee arthroplasty  Right.   Left Heart Cath and Coronary Angiography  on 05/18/2016 They were given perioperative antibiotics:  Anti-infectives    Start     Dose/Rate Route Frequency Ordered Stop   05/19/16 1415  levofloxacin (LEVAQUIN) tablet 750 mg     750 mg Oral Daily 05/19/16 1410     05/19/16 0000  levofloxacin (LEVAQUIN) 500 MG tablet     500 mg Oral Daily 05/19/16 1607     05/17/16 0600  vancomycin (VANCOCIN) IVPB 750 mg/150 ml premix  Status:  Discontinued     750 mg 150 mL/hr over 60 Minutes Intravenous Every 24 hours 05/16/16 0308 05/16/16 0746   05/17/16 0400  vancomycin (VANCOCIN) 1,500 mg in sodium chloride 0.9 % 500 mL IVPB  Status:  Discontinued     1,500 mg 250 mL/hr over 120 Minutes Intravenous Every 24 hours 05/16/16 0746 05/19/16 1408   05/16/16 1200  ceFEPIme (MAXIPIME) 1 g in dextrose 5 % 50 mL IVPB  Status:  Discontinued  Comments:  Cefepime 1 g IV q12h for CrCl < 60 mL/min   1 g 100 mL/hr over 30 Minutes Intravenous 2 times daily 05/16/16 0310 05/16/16 0720   05/16/16 0800  piperacillin-tazobactam (ZOSYN) IVPB 3.375 g  Status:  Discontinued     3.375 g 12.5 mL/hr over 240 Minutes Intravenous Every 8 hours 05/16/16 0746 05/19/16 1408   05/16/16 0230  vancomycin (VANCOCIN) 1,500 mg in sodium chloride 0.9 % 500 mL IVPB     1,500 mg 250 mL/hr over 120 Minutes Intravenous  Once 05/16/16 0212 05/16/16 0556   05/16/16 0200  ceFEPIme (MAXIPIME) 1 g in dextrose 5 % 50 mL IVPB  Status:  Discontinued     1 g 100 mL/hr over 30 Minutes Intravenous Every 8 hours 05/16/16 0113 05/16/16 0310   05/16/16 0030  doxycycline (VIBRAMYCIN) 100 mg in dextrose 5 % 250 mL IVPB  Status:  Discontinued     100 mg 125 mL/hr over 120 Minutes Intravenous Every 12 hours  05/16/16 0000 05/16/16 0112   05/12/16 1630  ceFAZolin (ANCEF) IVPB 2g/100 mL premix     2 g 200 mL/hr over 30 Minutes Intravenous Every 6 hours 05/12/16 1445 05/12/16 2349   05/12/16 1000  ceFAZolin (ANCEF) 3 g in dextrose 5 % 50 mL IVPB     3 g 130 mL/hr over 30 Minutes Intravenous To [facility] 05/11/16 0828 05/12/16 1056    .  Tolerated the procedure well.    POD #1, allowed out of bed to a chair.  PT for ambulation and exercise program.  Was slow to mobilize with PT and family had concerns regarding discharge home so plans made for d/c SNF, SW consulted.  POD #2, continued PT and ambulation.  Remained Hypotensive and somewhat lethargic believed may have been related to medication.  POD#3, still hypotensive, continued PT, transport had come to pick up pt for transfer to SNF and BP in 70's/30's range so was not transported.  Medication changes made.  Patient remained hypotensive even after fluid bolus and started to have diminished O2 sats.  Chest xray ordered revealed RLL PNA.  Hospitalist consulted and patient was transferred to step down unit. Started on IV antibiotics to treat possible sepsis for PNA.     Remainder of hospital course: Respiratory therapy for pulmonary tolieting consulted.  Labs showed elevated troponin 1.04 and abnormal EKG- new T wave changes so Cards was consulted.  2D Echocardiogram performed. ABLA worsened and was transfused 1 unit PRBC.  Held eliquis POD 5 for planned heart cath POD 6 which she underwent and Echo repeated no wall abnormalaties believed to be more demand ischemia secondary to recent surgery perioperative anemia and PNA.  Cards signed off.  Final recommendation made for Antibiotics from IM for the PNA will need to complete a few more days of po Levaquin post discharge.      Continued ambulation and strengthening with PT.   The patient was discharged 8 days post op knee and 1 day post op heart cath in  Stable condition.  Blood products given:  1unit PRBC  DIAGNOSTIC STUDIES: Recent vital signs:  Patient Vitals for the past 24 hrs:  BP Temp Temp src Pulse Resp SpO2 Height Weight  05/19/16 1216 (!) 104/55 98.6 F (37 C) Oral 81 18 90 % - -  05/19/16 0807 (!) 130/56 98.6 F (37 C) Oral 84 17 94 % - -  05/19/16 0444 140/65 98.7 F (37.1 C) Oral 87 18 92 % - 125.7  kg (277 lb 1.6 oz)  05/18/16 2346 101/71 - - 86 18 91 % - -  05/18/16 2313 - - - - 16 93 % - -  05/18/16 1806 113/63 - - 75 20 90 % 5\' 2"  (1.575 m) 126 kg (277 lb 12.8 oz)  05/18/16 1720 135/67 - - 87 (!) 21 99 % - -  05/18/16 1715 127/64 - - 83 (!) 68 98 % - -  05/18/16 1710 (!) 144/77 - - 81 (!) 28 96 % - -  05/18/16 1706 136/76 - - 82 20 93 % - -  05/18/16 1701 139/68 - - 84 (!) 26 91 % - -  05/18/16 1656 133/73 - - 84 (!) 24 91 % - -  05/18/16 1651 124/69 - - 81 (!) 63 (!) 89 % - -  05/18/16 1646 132/70 - - 84 (!) 23 96 % - -  05/18/16 1642 - - - - - 96 % - -  05/18/16 1641 (!) 141/75 - - 82 (!) 21 96 % - -  05/18/16 1637 - - - - - 97 % - -  05/18/16 1636 134/75 - - 82 (!) 25 97 % - -       Recent laboratory studies:  Recent Labs  05/13/16 0340 05/14/16 0402 05/15/16 0302 05/16/16 0156 05/17/16 0427 05/18/16 0148 05/19/16 0309  WBC 11.0* 15.9* 13.6* 11.6* 8.3 8.3 8.0  HGB 11.1* 11.2* 10.2* 9.4* 8.6* 9.9* 10.1*  HCT 37.3 36.6 33.4* 30.7* 28.6* 32.1* 32.3*  PLT 238 233 230 217 207 246 271    Recent Labs  05/13/16 0340 05/16/16 0156 05/17/16 0427 05/18/16 0538 05/19/16 0309  NA 137 135 138 139 138  K 4.8 3.9 3.6 3.6 3.4*  CL 103 104 110 110 107  CO2 23 21* 21* 23 23  BUN 17 33* 23* 15 12  CREATININE 1.13* 1.30* 1.04* 0.91 0.82  GLUCOSE 130* 125* 105* 92 96  CALCIUM 8.9 8.0* 7.8* 8.2* 8.4*   Lab Results  Component Value Date   INR 1.48 05/18/2016   INR 2.06 05/16/2016   INR 1.14 05/02/2016     Recent Radiographic Studies :  Dg Chest 2 View  Result Date: 05/02/2016 CLINICAL DATA:  Preop for knee replacement. EXAM: CHEST  2 VIEW  COMPARISON:  11/20/2015 FINDINGS: Linear areas of scarring in the mid and lower lungs bilaterally. Heart is normal size. No confluent opacities or effusions. No acute bony abnormality. IMPRESSION: Areas of scarring in the lungs bilaterally.  No active disease. Electronically Signed   By: Charlett Nose M.D.   On: 05/02/2016 12:32   Dg Chest Port 1 View  Result Date: 05/15/2016 CLINICAL DATA:  Decreased oxygen saturation. Low blood pressure. History of CHF, hypertension, asthma. EXAM: PORTABLE CHEST 1 VIEW COMPARISON:  05/02/2016 FINDINGS: Increasing linear infiltrates and volume loss in the right mid and lower lungs likely represent developing area of pneumonia. Normal heart size and pulmonary vascularity. No blunting of costophrenic angles. No pneumothorax. Calcification of the aorta. Degenerative changes in the spine and shoulders. IMPRESSION: Increasing linear infiltrates and volume loss in the right mid and lower lungs may represent developing area of pneumonia. Electronically Signed   By: Burman Nieves M.D.   On: 05/15/2016 22:16    DISCHARGE INSTRUCTIONS:   DISCHARGE MEDICATIONS:     Medication List    STOP taking these medications   acetaminophen 500 MG tablet Commonly known as:  TYLENOL   aspirin EC 81 MG tablet  ibuprofen 200 MG tablet Commonly known as:  ADVIL,MOTRIN     TAKE these medications   apixaban 2.5 MG Tabs tablet Commonly known as:  ELIQUIS Take 1 tablet (2.5 mg total) by mouth 2 (two) times daily.   carvedilol 3.125 MG tablet Commonly known as:  COREG Take 3.125 mg by mouth 2 (two) times daily with a meal.   docusate sodium 100 MG capsule Commonly known as:  COLACE Take 100 mg by mouth at bedtime.   furosemide 20 MG tablet Commonly known as:  LASIX Take 20 mg by mouth daily.   glipiZIDE 2.5 MG 24 hr tablet Commonly known as:  GLUCOTROL XL Take 2.5 mg by mouth daily with breakfast.   HYDROcodone-acetaminophen 5-325 MG tablet Commonly known as:   NORCO/VICODIN Take 1 tablet by mouth every 4 (four) hours as needed for moderate pain or severe pain.   INVOKANA 300 MG Tabs tablet Generic drug:  canagliflozin Take 300 mg by mouth daily before breakfast.   levofloxacin 500 MG tablet Commonly known as:  LEVAQUIN Take 1 tablet (500 mg total) by mouth daily. Last dose should be 05/22/16   levothyroxine 150 MCG tablet Commonly known as:  SYNTHROID, LEVOTHROID Take 150 mcg by mouth daily before breakfast.   nitroGLYCERIN 0.4 MG SL tablet Commonly known as:  NITROSTAT Place 0.4 mg under the tongue every 5 (five) minutes as needed for chest pain. Max 3   ondansetron 4 MG tablet Commonly known as:  ZOFRAN Take 1 tablet (4 mg total) by mouth every 6 (six) hours as needed for nausea.   ONE TOUCH ULTRA TEST test strip Generic drug:  glucose blood Apply 1 strip topically as directed.   oxyCODONE 5 MG immediate release tablet Commonly known as:  ROXICODONE 1-2 tabs po q4-6hrs prn pain   traMADol 50 MG tablet Commonly known as:  ULTRAM Take 2 tablets (100 mg total) by mouth every 6 (six) hours as needed. What changed:  how much to take  when to take this       FOLLOW UP VISIT:   Follow-up Information    CAFFREY JR,W D, MD. Schedule an appointment as soon as possible for a visit in 2 week(s).   Specialty:  Orthopedic Surgery Contact information: [address] [place] [address] [phone]        [facility] .   Why:  Home Health Physical Therapy Contact information: [address] [place] [address] [phone]        Alan Mulder, MD. Schedule an appointment as soon as possible for a visit today.   Specialty:  Endocrinology Why:  1-2 weeks  Contact information: [address] [place] [address] [phone]           DISPOSITION:   Skilled Nursing Facility/Rehab  CONDITION:  Stable   Margart Sickles, PA-C [facility] [phone]  05/19/2016 4:35 PM

## 2016-05-20 ENCOUNTER — Other Ambulatory Visit
Admission: RE | Admit: 2016-05-20 | Discharge: 2016-05-20 | Disposition: A | Discharge status: Home / Self Care | Payer: Medicare Other | Source: Ambulatory Visit | Attending: Family Medicine | Admitting: Family Medicine

## 2016-05-20 DIAGNOSIS — R75 Inconclusive laboratory evidence of human immunodeficiency virus [HIV]: Secondary | ICD-10-CM

## 2016-05-20 DIAGNOSIS — J189 Pneumonia, unspecified organism: Secondary | ICD-10-CM | POA: Insufficient documentation

## 2016-05-20 LAB — HIV-1 RNA QUANT-NO REFLEX-BLD: LOG10 HIV-1 RNA: UNDETERMINED {Log_copies}/mL

## 2016-05-20 LAB — GLUCOSE, CAPILLARY
GLUCOSE-CAPILLARY: 102 mg/dL — AB (ref 65–99)
Glucose-Capillary: 99 mg/dL (ref 65–99)

## 2016-05-20 NOTE — Progress Notes (Signed)
Patient ID: Donalee Citrindna H Marcon, female   DOB: 1941-01-03, 75 y.o.   MRN: [id number]     Subjective:  Patient reports pain as mild.  Patient reports improvement overall but some slight increase in pain today.  Denies any CP orSOB  Objective:   VITALS:   Vitals:   05/19/16 1216 05/19/16 2136 05/19/16 2300 05/20/16 0517  BP: (!) 104/55 (!) 110/47  (!) 132/52  Pulse: 81 89 87 84  Resp: 18 17 18 20   Temp: 98.6 F (37 C) 98.6 F (37 C)  98.6 F (37 C)  TempSrc: Oral Axillary  Oral  SpO2: 90% 92% 94% 92%  Weight:    124 kg (273 lb 6.4 oz)  Height:        ABD soft Sensation intact distally Dorsiflexion/Plantar flexion intact Incision: dressing C/D/I and no drainage Good foot and ankle motion patient in CPM  Lab Results  Component Value Date   WBC 8.0 05/19/2016   HGB 10.1 (L) 05/19/2016   HCT 32.3 (L) 05/19/2016   MCV 88.7 05/19/2016   PLT 271 05/19/2016   BMET    Component Value Date/Time   NA 138 05/19/2016 0309   NA 141 05/09/2014 1512   K 3.4 (L) 05/19/2016 0309   K 3.4 (L) 05/09/2014 1512   CL 107 05/19/2016 0309   CL 108 (H) 05/09/2014 1512   CO2 23 05/19/2016 0309   CO2 24 05/09/2014 1512   GLUCOSE 96 05/19/2016 0309   GLUCOSE 105 (H) 05/09/2014 1512   BUN 12 05/19/2016 0309   BUN 19 (H) 05/09/2014 1512   CREATININE 0.82 05/19/2016 0309   CREATININE 1.22 05/09/2014 1512   CALCIUM 8.4 (L) 05/19/2016 0309   CALCIUM 9.1 05/09/2014 1512   GFRNONAA >60 05/19/2016 0309   GFRNONAA 44 (L) 05/09/2014 1512   GFRAA >60 05/19/2016 0309   GFRAA 51 (L) 05/09/2014 1512     Assessment/Plan: 2 Days Post-Op   Principal Problem:   HAP (hospital-acquired pneumonia) Active Problems:   Primary localized osteoarthritis of right knee   Sepsis associated hypotension (HCC)   Acute respiratory failure with hypoxia (HCC)   NSTEMI (non-ST elevated myocardial infarction) (HCC)   Low oxygen saturation   Elevated troponin   Advance diet Up with therapy Discharge home with  home health WBAT Dry dressing PRN   Haskel KhanDOUGLAS Takeshi Teasdale 05/20/2016, 10:32 AM   Mckinley Jewelaniel Murphy, MD

## 2016-05-20 NOTE — Progress Notes (Signed)
Physical Therapy Treatment Patient Details Name: RAHI CHANDONNET MRN: [id number] DOB: [birth year] Today's Date: 05/20/2016    History of Present Illness 75 yo female with Right knee OA, s/p TKA on 05/12/16.  Resp issues on 9/18 and transfer to ICU.    PT Comments    Pt very tearful this AM stating she is just feeling sorry for herself. Pt's feelings validated and explained that feeling overwhelmed is understandable. Rx session focused on ADLs and transfers to help with pt's mood. Pt very grateful for the assist. She demo significant improvements in her transfer ability, requiring decreased assist.  Follow Up Recommendations  SNF;Supervision/Assistance - 24 hour     Equipment Recommendations  None recommended by PT    Recommendations for Other Services       Precautions / Restrictions Precautions Precautions: Fall;Knee Required Braces or Orthoses: Knee Immobilizer - Right Knee Immobilizer - Right: On when out of bed or walking Restrictions RLE Weight Bearing: Weight bearing as tolerated    Mobility  Bed Mobility     Rolling: Modified independent (Device/Increase time)     Sit to supine: Min assist   General bed mobility comments: assist with RLE into bed  Transfers   Equipment used: Rolling walker (2 wheeled)   Sit to Stand: Min assist Stand pivot transfers: Min assist       General transfer comment: Pt demo proper technique without cueing. Assist to power up.  Ambulation/Gait                 Stairs            Wheelchair Mobility    Modified Rankin (Stroke Patients Only)       Balance   Sitting-balance support: No upper extremity supported;Feet supported Sitting balance-Leahy Scale: Good     Standing balance support: Bilateral upper extremity supported;During functional activity Standing balance-Leahy Scale: Fair Standing balance comment: Standing activities at sink for ADLs.                    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

## 2016-05-20 NOTE — Clinical Social Work Note (Signed)
Clinical Social Worker facilitated patient discharge including contacting patient family and facility to confirm patient discharge plans.  Clinical information faxed to facility and family agreeable with plan.  CSW arranged ambulance transport via [facility] to [facility].  RN to call report prior to discharge.  Clinical Social Worker will sign off for now as social work intervention is no longer needed. Please consult us again if new need arises.  Macario GoldsJesse Akaila Rambo, [place] [phone]

## 2016-05-20 NOTE — Progress Notes (Signed)
1713 [facility]

## 2016-05-20 NOTE — Progress Notes (Signed)
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  More explicitly, she has had 3 HIV tests done, only one of which is positive.  Given her age, I would suggest that this is a false positive.  I will f/u her repeat testing which I ordered yesterday.  I would not change her current treatment plan.

## 2016-05-20 NOTE — Progress Notes (Addendum)
1700 [facility]  . Family aware . Pt fully awake alert .no apparent distress

## 2016-05-20 NOTE — Progress Notes (Signed)
Consult follow up  PROGRESS NOTE                                                                                                                                                                                                             Patient Demographics:    Tamara [name], is a 75 y.o. female, DOB - 05-30-1941, ZOX:[id number]  Admit date - 05/12/2016   Admitting Physician Frederico Hammananiel Caffrey, MD  Outpatient Primary MD for the patient is Alan MulderMORAYATI,SHAMIL J, MD LOS - 8 No chief complaint on file.     Brief Narrative   Tamara [name] is an 75 y.o. female with H/o DM, Hypothyroidism was admitted to hospital 5 days ago for TKA, underwent surgery 9/15 per Dr.Caffrey, post op she developed productive cough and congestion, mild tachycardia and hypotension. TRH was consulted,  Tachycardia and hypotension are improving with ongoing IVF.  CXR demonstrates RLL PNA. She was on Eliquis for DVT prophylaxis. She was also noted to have Troponin of 1.04 and Abnormal EKG-new T wave changes and hence Cards was consulted, plan for LHC on Thursday   Subjective:    Tamara [name] Pt has no new complaints.   Assessment  & Plan :    Acute hypoxic respiratory failure - This is related to HCAP, low probability for PE given patient has been on Eliquis post op - improving on antibiotics  HCAP - if discharging recommend oral levaquin for 2 more days to complete a 7 day treatment regimen - incentive spirometry, OOB, pulm toilet  NSTEMI -post op, in setting of hypotension, infection and post op anemia -pre op had a negative stress test -Place back on Eliquis -Troponin trending down, no chest pain, no further intervention planned by cardiology, they will signed off  Acute post op blood loss anemia - s/p 1 unit PRBC - will resume eliquis  Hypotension - resolved.  Diabetes mellitus - hold oral hypoglycemic agent, continue with insulin sliding scale. May continue home medication  regimen on discharge.   Hypothyroidism - Continue levothyroxine  Status post right TKA - per primary orthopedic team - lovenox for DVT prophylaxis x1 dose today  Code Status : Full Family Communication  : daughter at bedside Disposition Plan  :  Addendum: Pt is cleared for discharge on oral antibiotic from our end. She would need 3 more days of antibiotics  DVT Prophylaxis : Eliquis   Lab Results  Component Value Date   PLT 271 05/19/2016    Antibiotics  :   Anti-infectives    Start     Dose/Rate Route Frequency Ordered Stop   05/19/16 1415  levofloxacin (LEVAQUIN) tablet 750 mg     750 mg Oral Daily 05/19/16 1410     05/19/16 0000  levofloxacin (LEVAQUIN) 500 MG tablet     500 mg Oral Daily 05/19/16 1607     05/17/16 0600  vancomycin (VANCOCIN) IVPB 750 mg/150 ml premix  Status:  Discontinued     750 mg 150 mL/hr over 60 Minutes Intravenous Every 24 hours 05/16/16 0308 05/16/16 0746   05/17/16 0400  vancomycin (VANCOCIN) 1,500 mg in sodium chloride 0.9 % 500 mL IVPB  Status:  Discontinued     1,500 mg 250 mL/hr over 120 Minutes Intravenous Every 24 hours 05/16/16 0746 05/19/16 1408   05/16/16 1200  ceFEPIme (MAXIPIME) 1 g in dextrose 5 % 50 mL IVPB  Status:  Discontinued    Comments:  Cefepime 1 g IV q12h for CrCl < 60 mL/min   1 g 100 mL/hr over 30 Minutes Intravenous 2 times daily 05/16/16 0310 05/16/16 0720   05/16/16 0800  piperacillin-tazobactam (ZOSYN) IVPB 3.375 g  Status:  Discontinued     3.375 g 12.5 mL/hr over 240 Minutes Intravenous Every 8 hours 05/16/16 0746 05/19/16 1408   05/16/16 0230  vancomycin (VANCOCIN) 1,500 mg in sodium chloride 0.9 % 500 mL IVPB     1,500 mg 250 mL/hr over 120 Minutes Intravenous  Once 05/16/16 0212 05/16/16 0556   05/16/16 0200  ceFEPIme (MAXIPIME) 1 g in dextrose 5 % 50 mL IVPB  Status:  Discontinued     1 g 100 mL/hr over 30 Minutes Intravenous Every 8 hours 05/16/16 0113 05/16/16 0310   05/16/16 0030  doxycycline  (VIBRAMYCIN) 100 mg in dextrose 5 % 250 mL IVPB  Status:  Discontinued     100 mg 125 mL/hr over 120 Minutes Intravenous Every 12 hours 05/16/16 0000 05/16/16 0112   05/12/16 1630  ceFAZolin (ANCEF) IVPB 2g/100 mL premix     2 g 200 mL/hr over 30 Minutes Intravenous Every 6 hours 05/12/16 1445 05/12/16 2349   05/12/16 1000  ceFAZolin (ANCEF) 3 g in dextrose 5 % 50 mL IVPB     3 g 130 mL/hr over 30 Minutes Intravenous To [facility] 05/11/16 0828 05/12/16 1056        Objective:   Vitals:   05/19/16 1216 05/19/16 2136 05/19/16 2300 05/20/16 0517  BP: (!) 104/55 (!) 110/47  (!) 132/52  Pulse: 81 89 87 84  Resp: 18 17 18 20   Temp: 98.6 F (37 C) 98.6 F (37 C)  98.6 F (37 C)  TempSrc: Oral Axillary  Oral  SpO2: 90% 92% 94% 92%  Weight:    124 kg (273 lb 6.4 oz)  Height:        Wt Readings from Last 3 Encounters:  05/20/16 124 kg (273 lb 6.4 oz)  05/02/16 115.8 kg (255 lb 4.8 oz)  03/16/16 115.2 kg (254 lb)     Intake/Output Summary (Last 24 hours) at 05/20/16 0942 Last data filed at 05/20/16 0734  Gross per 24 hour  Intake              480 ml  Output             2175 ml  Net            -  1695 ml     Physical Exam  Awake Alert, Oriented X 3,  Supple Neck,No JVD Symmetrical Chest wall movement, Good air movement bilaterally, no wheezing RRR,No Gallops,Rubs or new Murmurs, No Parasternal Heave +ve B.Sounds, Abd Soft, No tenderness, no guarding No Cyanosis, Clubbing or edema, Right knee bandaged    Data Review:    CBC  Recent Labs Lab 05/15/16 0302 05/16/16 0156 05/17/16 0427 05/18/16 0148 05/19/16 0309  WBC 13.6* 11.6* 8.3 8.3 8.0  HGB 10.2* 9.4* 8.6* 9.9* 10.1*  HCT 33.4* 30.7* 28.6* 32.1* 32.3*  PLT 230 217 207 246 271  MCV 89.8 89.5 89.9 90.7 88.7  MCH 27.4 27.4 27.0 28.0 27.7  MCHC 30.5 30.6 30.1 30.8 31.3  RDW 15.2 15.3 15.7* 15.7* 15.5    Chemistries   Recent Labs Lab 05/16/16 0156 05/17/16 0427 05/18/16 0538 05/19/16 0309  NA  135 138 139 138  K 3.9 3.6 3.6 3.4*  CL 104 110 110 107  CO2 21* 21* 23 23  GLUCOSE 125* 105* 92 96  BUN 33* 23* 15 12  CREATININE 1.30* 1.04* 0.91 0.82  CALCIUM 8.0* 7.8* 8.2* 8.4*   ------------------------------------------------------------------------------------------------------------------  Recent Labs  05/18/16 0820  CHOL 126  HDL 33*  LDLCALC 75  TRIG 91  CHOLHDL 3.8    Lab Results  Component Value Date   HGBA1C 6.1 (H) 05/18/2016   ------------------------------------------------------------------------------------------------------------------ No results for input(s): TSH, T4TOTAL, T3FREE, THYROIDAB in the last 72 hours.  Invalid input(s): FREET3 ------------------------------------------------------------------------------------------------------------------ No results for input(s): VITAMINB12, FOLATE, FERRITIN, TIBC, IRON, RETICCTPCT in the last 72 hours.  Coagulation profile  Recent Labs Lab 05/16/16 0156 05/18/16 0148  INR 2.06 1.48    No results for input(s): DDIMER in the last 72 hours.  Cardiac Enzymes  Recent Labs Lab 05/16/16 1622 05/16/16 2242 05/17/16 0427  TROPONINI 0.46* 0.39* 0.30*   ------------------------------------------------------------------------------------------------------------------ No results found for: BNP  Inpatient Medications  Scheduled Meds: . apixaban  2.5 mg Oral BID  . docusate sodium  100 mg Oral QHS  . guaiFENesin  1,200 mg Oral BID  . insulin aspart  0-20 Units Subcutaneous TID WC  . insulin aspart  0-5 Units Subcutaneous QHS  . insulin aspart  6 Units Subcutaneous TID WC  . levofloxacin  750 mg Oral Daily  . levothyroxine  150 mcg Oral QAC breakfast  . mouth rinse  15 mL Mouth Rinse BID  . polyethylene glycol  17 g Oral Daily  . sodium chloride flush  3 mL Intravenous Q12H   Continuous Infusions:   PRN Meds:.sodium chloride, acetaminophen **OR** acetaminophen, bisacodyl,  HYDROcodone-acetaminophen, menthol-cetylpyridinium **OR** phenol, metoCLOPramide **OR** metoCLOPramide (REGLAN) injection, nitroGLYCERIN, ondansetron **OR** ondansetron (ZOFRAN) IV, promethazine, senna-docusate, sodium chloride flush, sodium phosphate, traMADol  Micro Results Recent Results (from the past 240 hour(s))  Culture, blood (routine x 2) Call MD if unable to obtain prior to antibiotics being given     Status: None (Preliminary result)   Collection Time: 05/16/16  1:36 AM  Result Value Ref Range Status   Specimen Description BLOOD RIGHT HAND  Final   Special Requests IN PEDIATRIC BOTTLE 2CC  Final   Culture NO GROWTH 3 DAYS  Final   Report Status PENDING  Incomplete  Culture, blood (routine x 2) Call MD if unable to obtain prior to antibiotics being given     Status: None (Preliminary result)   Collection Time: 05/16/16  1:40 AM  Result Value Ref Range Status   Specimen Description BLOOD LEFT ANTECUBITAL  Final  Special Requests BOTTLES DRAWN AEROBIC AND ANAEROBIC 5CC  Final   Culture NO GROWTH 3 DAYS  Final   Report Status PENDING  Incomplete    Radiology Reports Dg Chest 2 View  Result Date: 05/02/2016 CLINICAL DATA:  Preop for knee replacement. EXAM: CHEST  2 VIEW COMPARISON:  11/20/2015 FINDINGS: Linear areas of scarring in the mid and lower lungs bilaterally. Heart is normal size. No confluent opacities or effusions. No acute bony abnormality. IMPRESSION: Areas of scarring in the lungs bilaterally.  No active disease. Electronically Signed   By: Charlett Nose M.D.   On: 05/02/2016 12:32   Dg Chest Port 1 View  Result Date: 05/15/2016 CLINICAL DATA:  Decreased oxygen saturation. Low blood pressure. History of CHF, hypertension, asthma. EXAM: PORTABLE CHEST 1 VIEW COMPARISON:  05/02/2016 FINDINGS: Increasing linear infiltrates and volume loss in the right mid and lower lungs likely represent developing area of pneumonia. Normal heart size and pulmonary vascularity. No blunting  of costophrenic angles. No pneumothorax. Calcification of the aorta. Degenerative changes in the spine and shoulders. IMPRESSION: Increasing linear infiltrates and volume loss in the right mid and lower lungs may represent developing area of pneumonia. Electronically Signed   By: Burman Nieves M.D.   On: 05/15/2016 22:16    Penny Pia M.D on 05/20/2016 at 9:42 AM  Between 7am to 7pm - Pager [phone]  After 7pm go to [email or web address] - password [facility]  [facility] -  Office  [phone]

## 2016-05-20 NOTE — Clinical Social Work Placement (Signed)
   CLINICAL SOCIAL WORK PLACEMENT  NOTE  Date:  05/20/2016  Patient Details  Name: Tamara [name] MRN: [id number] Date of Birth: 05-24-1941  Clinical Social Work is seeking post-discharge placement for this patient at the Skilled  Nursing Facility level of care (*CSW will initial, date and re-position this form in  chart as items are completed):  Yes   Patient/family provided with [place] Clinical Social Work Department's list of facilities offering this level of care within the geographic area requested by the patient (or if unable, by the patient's family).  Yes   Patient/family informed of their freedom to choose among providers that offer the needed level of care, that participate in Medicare, Medicaid or managed care program needed by the patient, have an available bed and are willing to accept the patient.  Yes   Patient/family informed of [facility] ownership interest in [facility] and [facility], as well as of the fact that they are under no obligation to receive care at these facilities.  PASRR submitted to EDS on 05/15/16     PASRR number received on 05/15/16     Existing PASRR number confirmed on       FL2 transmitted to all facilities in geographic area requested by pt/family on 05/15/16     FL2 transmitted to all facilities within larger geographic area on       Patient informed that his/her managed care company has contracts with or will negotiate with certain facilities, including the following:        Yes   Patient/family informed of bed offers received.  Patient chooses bed at [facility]     Physician recommends and patient chooses bed at      Patient to be transferred to [facility] on 05/20/16.  Patient to be transferred to facility by Ambulance     Patient family notified on 05/20/16 of transfer.  Name of family member notified:  Patient family at bedside     PHYSICIAN Please prepare priority discharge summary,  including medications     Additional Comment:    Macario GoldsJesse Lorelie Biermann, LCSW [phone]

## 2016-05-21 LAB — CULTURE, BLOOD (ROUTINE X 2)
CULTURE: NO GROWTH
Culture: NO GROWTH

## 2016-05-22 ENCOUNTER — Telehealth: Payer: Self-pay | Admitting: Infectious Disease

## 2016-05-22 LAB — LEGIONELLA PNEUMOPHILA SEROGP 1 UR AG: L. pneumophila Serogp 1 Ur Ag: NEGATIVE

## 2016-05-22 NOTE — Telephone Encounter (Signed)
Patient had false + HIV test as borne out by HIV quant and qual RNA being negative

## 2016-05-24 LAB — HIV 1/2 AB DIFFERENTIATION
HIV 1 Ab: NEGATIVE
HIV 2 Ab: NEGATIVE

## 2016-05-24 LAB — RNA QUALITATIVE: HIV 1 RNA Qualitative: 1

## 2016-05-24 LAB — HIV ANTIBODY (ROUTINE TESTING W REFLEX)

## 2016-05-25 ENCOUNTER — Telehealth: Payer: Self-pay

## 2016-05-25 NOTE — Telephone Encounter (Signed)
Duplicate/error

## 2016-05-25 NOTE — Telephone Encounter (Signed)
Tamara ReamsSteven Reed with [employer] left v/m requesting verification that pt was admitted to [facility] on 05/15/16. I called  [facility] and was advised that pt is not a resident there. I advised Mr Renato GailsReed of that info and he wants to know where pt is; I called pt and she wants to speak with Mr Renato GailsReed and gave me permission to give Mr Renato GailsReed her cell #. Mr Renato GailsReed voiced understanding.

## 2016-11-17 ENCOUNTER — Telehealth: Payer: Self-pay | Admitting: Internal Medicine

## 2016-11-17 NOTE — Telephone Encounter (Signed)
Tamara [name] [facility] requests clearance for: 1. Type of surgery: left total knee replacement 2. Date of surgery: 12/29/2016 3. Surgeon: Dr. Frederico Hammananiel Caffrey 4. Medications that need to be held & how long: none specified - patient is on eliquis 5. Fax and/or Phone: (p) [phone]  (f) [phone] -- attn: Tresa EndoKelly  ** request for most recent note, labs, EKG or special studies

## 2016-11-17 NOTE — Telephone Encounter (Signed)
Clearance routed via EPIC 

## 2016-11-17 NOTE — Telephone Encounter (Signed)
Low risk for knee surgery - I cleared her last year. She has had a cath since then which showed normal coronaries. I'm not sure why she is still on Eliquis? Apparently it was started for DVT prophylaxis with knee surgery last year - I would advise she stop it at least 3 days prior to surgery - but likely can come off it completely. Will check with Dr. Madelon Lipsaffrey.  Dr. Rennis GoldenHilty

## 2016-12-15 ENCOUNTER — Ambulatory Visit: Payer: Self-pay | Admitting: Physician Assistant

## 2016-12-15 NOTE — H&P (Signed)
TOTAL KNEE ADMISSION H&P  Patient is being admitted for left total knee arthroplasty.  Subjective:  Chief Complaint:left knee pain.  HPI: Tamara [name], 76 y.o. female, has a history of pain and functional disability in the left knee due to arthritis and has failed non-surgical conservative treatments for greater than 12 weeks to includeNSAID's and/or analgesics, corticosteriod injections and activity modification.  Onset of symptoms was gradual, starting 6 years ago with gradually worsening course since that time. The patient noted no past surgery on the left knee(s).  Patient currently rates pain in the left knee(s) at 8 out of 10 with activity. Patient has night pain, worsening of pain with activity and weight bearing, pain that interferes with activities of daily living, pain with passive range of motion, crepitus and joint swelling.  Patient has evidence of periarticular osteophytes and joint space narrowing by imaging studies.There is no active infection.  Patient Active Problem List   Diagnosis Date Noted  . Elevated troponin   . HAP (hospital-acquired pneumonia) 05/16/2016  . Sepsis associated hypotension (HCC) 05/16/2016  . Acute respiratory failure with hypoxia (HCC) 05/16/2016  . NSTEMI (non-ST elevated myocardial infarction) (HCC)   . Low oxygen saturation   . Primary localized osteoarthritis of right knee 05/12/2016  . Paroxysmal SVT (supraventricular tachycardia) (HCC) 03/01/2016  . Preprocedural cardiovascular examination 03/01/2016  . Pain in the chest 03/01/2016   Past Medical History:  Diagnosis Date  . Anemia    distant hx of  . Arthritis    KNEES,HANDS,WRIST  . Asthma    allergy related  . 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 Bronchitis   . Cardiomegaly   . CHF (congestive heart failure) (HCC)    stopped using diuretics due to kidney issues, watching salt intake  . Chronic cough   . Chronic kidney disease    due to DM  . Complication of  anesthesia    BP bottomed out during Achilles Tendon repair >72yrs ago  . Constipation due to pain medication   . Diabetes mellitus without complication (HCC)    type 2  . Diverticulitis   . Gout   . 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 History of blood transfusion   . Hypertension   . Hypothyroidism   . 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 Obesity   . Palpitations   . Pneumonia    hx of- 05/02/16- last time approx - 6 years ago  . Shortness of breath dyspnea    with exertion  . SVT (supraventricular tachycardia) (HCC)   . Wheezing     Past Surgical History:  Procedure Laterality Date  . ABDOMINAL HYSTERECTOMY    . ABDOMINAL HYSTERECTOMY  1984  . ACHILLES TENDON REPAIR Left   . CARDIAC CATHETERIZATION       . CARDIAC CATHETERIZATION N/A 05/18/2016   Procedure: Left Heart Cath and Coronary Angiography;  Surgeon: Kathleene Hazel, MD;  Location: [facility] INVASIVE CV LAB;  Service: Cardiovascular;  Laterality: N/A;  . CATARACT EXTRACTION W/PHACO Left 02/10/2015   Procedure: CATARACT EXTRACTION PHACO AND INTRAOCULAR LENS PLACEMENT (IOC);  Surgeon: Lockie Mola, MD;  Location: [facility]GERY [facility]TR;  Service: Ophthalmology;  Laterality: Left;  DIABETIC  . CATARACT EXTRACTION W/PHACO Right 02/24/2015   Procedure: CATARACT EXTRACTION PHACO AND INTRAOCULAR LENS PLACEMENT (IOC);  Surgeon: Lockie Mola, MD;  [facility];  Service: Ophthalmology;  Laterality: Right;  .  CESAREAN SECTION  1978  . CHOLECYSTECTOMY  1986  . EYE SURGERY Bilateral    Cataract   . TOTAL KNEE ARTHROPLASTY Right 05/12/2016   Procedure: TOTAL KNEE ARTHROPLASTY;  Surgeon: Frederico Hamman, MD;  [facility];  Service: Orthopedics;  Laterality: Right;     (Not in a hospital admission) Allergies  Allergen Reactions  . Codeine Nausea And Vomiting    Social History  Substance Use Topics  . Smoking status: Never Smoker  . Smokeless tobacco:  Former Neurosurgeon  . Alcohol use No    Family History  Problem Relation Age of Onset  . Lung cancer Mother   . Heart attack Mother   . Heart Problems Father   . Hypertension Father   . Other Father     respiratory problems  . Ovarian cancer Maternal Grandmother   . Cancer Maternal Grandfather   . Stroke Paternal Grandmother   . Diabetes Brother   . Liver cancer Brother   . COPD Sister   . Other Sister     sepsis  . Heart attack Son   . Heart attack Son   . Rheum arthritis Daughter   . Rheum arthritis Daughter   . Thyroid cancer Daughter   . Hepatitis C Daughter      Review of Systems  Constitutional: Positive for weight loss.  HENT: Positive for hearing loss.   Respiratory: Positive for shortness of breath.   Cardiovascular: Positive for chest pain, palpitations and leg swelling.  Gastrointestinal: Positive for constipation, diarrhea, nausea and vomiting.  Genitourinary: Positive for dysuria, frequency, hematuria and urgency.  Musculoskeletal: Positive for joint pain.  Skin: Positive for rash.  Neurological: Positive for dizziness and headaches.  Endo/Heme/Allergies: Bruises/bleeds easily.  Psychiatric/Behavioral: The patient is nervous/anxious.     Objective:  Physical Exam  Constitutional: She is oriented to person, place, and time. She appears well-developed and well-nourished. No distress.  HENT:  Head: Normocephalic and atraumatic.  Nose: Nose normal.  Eyes: Conjunctivae and EOM are normal. Pupils are equal, round, and reactive to light.  Neck: Normal range of motion. Neck supple.  Cardiovascular: Normal rate, regular rhythm, normal heart sounds and intact distal pulses.   Respiratory: Effort normal and breath sounds normal. No respiratory distress. She has no wheezes.  GI: Soft. Bowel sounds are normal. She exhibits no distension. There is no tenderness.  Musculoskeletal:       Left knee: She exhibits normal range of motion. Tenderness found.  Lymphadenopathy:     She has no cervical adenopathy.  Neurological: She is alert and oriented to person, place, and time. No cranial nerve deficit.  Skin: Skin is warm and dry. No rash noted. No erythema.  Psychiatric: She has a normal mood and affect. Her behavior is normal.    Vital signs in last 24 hours: @  Labs:   Estimated body mass index is 50.01 kg/m as calculated from the following:   Height as of 05/18/16:  (1.575 m).   Weight as of 05/20/16: 124 kg (273 lb 6.4 oz).   Imaging Review Plain radiographs demonstrate severe degenerative joint disease of the left knee(s). The overall alignment issignificant varus. The bone quality appears to be good for age and reported activity level.  Assessment/Plan:  End stage arthritis, left knee   The patient history, physical examination, clinical judgment of the provider and imaging studies are consistent with end stage degenerative joint disease of the left knee(s) and total knee arthroplasty is deemed medically necessary. The treatment  options including medical management, injection therapy arthroscopy and arthroplasty were discussed at length. The risks and benefits of total knee arthroplasty were presented and reviewed. The risks due to aseptic loosening, infection, stiffness, patella tracking problems, thromboembolic complications and other imponderables were discussed. The patient acknowledged the explanation, agreed to proceed with the plan and consent was signed. Patient is being admitted for inpatient treatment for surgery, pain control, PT, OT, prophylactic antibiotics, VTE prophylaxis, progressive ambulation and ADL's and discharge planning. The patient is planning to be discharged to skilled nursing facility

## 2016-12-19 ENCOUNTER — Encounter (HOSPITAL_COMMUNITY): Payer: Self-pay

## 2016-12-19 ENCOUNTER — Other Ambulatory Visit (HOSPITAL_COMMUNITY): Payer: Self-pay | Admitting: *Deleted

## 2016-12-19 ENCOUNTER — Ambulatory Visit (HOSPITAL_COMMUNITY)
Admission: RE | Admit: 2016-12-19 | Discharge: 2016-12-19 | Disposition: A | Discharge status: Home / Self Care | Payer: Medicare Other | Source: Ambulatory Visit | Attending: Physician Assistant | Admitting: Physician Assistant

## 2016-12-19 ENCOUNTER — Encounter (HOSPITAL_COMMUNITY)
Admission: RE | Admit: 2016-12-19 | Discharge: 2016-12-19 | Disposition: A | Discharge status: Home / Self Care | Payer: Medicare Other | Source: Ambulatory Visit | Attending: Orthopedic Surgery | Admitting: Orthopedic Surgery

## 2016-12-19 DIAGNOSIS — Z0183 Encounter for blood typing: Secondary | ICD-10-CM | POA: Diagnosis not present

## 2016-12-19 DIAGNOSIS — Z01818 Encounter for other preprocedural examination: Secondary | ICD-10-CM | POA: Diagnosis not present

## 2016-12-19 DIAGNOSIS — Z01812 Encounter for preprocedural laboratory examination: Secondary | ICD-10-CM | POA: Insufficient documentation

## 2016-12-19 DIAGNOSIS — M1712 Unilateral primary osteoarthritis, left knee: Secondary | ICD-10-CM

## 2016-12-19 HISTORY — DX: Sleep apnea, unspecified: G47.30

## 2016-12-19 HISTORY — DX: Headache: R51

## 2016-12-19 HISTORY — DX: Cardiac arrhythmia, unspecified: I49.9

## 2016-12-19 HISTORY — DX: Headache, unspecified: R51.9

## 2016-12-19 LAB — COMPREHENSIVE METABOLIC PANEL
ALK PHOS: 61 U/L (ref 38–126)
ALT: 16 U/L (ref 14–54)
ANION GAP: 10 (ref 5–15)
AST: 20 U/L (ref 15–41)
Albumin: 3.1 g/dL — ABNORMAL LOW (ref 3.5–5.0)
BUN: 20 mg/dL (ref 6–20)
CALCIUM: 9.5 mg/dL (ref 8.9–10.3)
CHLORIDE: 102 mmol/L (ref 101–111)
CO2: 26 mmol/L (ref 22–32)
CREATININE: 0.96 mg/dL (ref 0.44–1.00)
GFR, EST NON AFRICAN AMERICAN: 56 mL/min — AB (ref >60)
Glucose, Bld: 121 mg/dL — ABNORMAL HIGH (ref 65–99)
Potassium: 4.1 mmol/L (ref 3.5–5.1)
Sodium: 138 mmol/L (ref 135–145)
Total Bilirubin: 0.7 mg/dL (ref 0.3–1.2)
Total Protein: 7.1 g/dL (ref 6.5–8.1)

## 2016-12-19 LAB — CBC WITH DIFFERENTIAL/PLATELET
Basophils Absolute: 0 10*3/uL (ref 0.0–0.1)
Basophils Relative: 0 %
EOS PCT: 0 %
Eosinophils Absolute: 0 10*3/uL (ref 0.0–0.7)
HCT: 39.3 % (ref 36.0–46.0)
Hemoglobin: 12.2 g/dL (ref 12.0–15.0)
LYMPHS ABS: 1.9 10*3/uL (ref 0.7–4.0)
LYMPHS PCT: 19 %
MCH: 27.5 pg (ref 26.0–34.0)
MCHC: 31 g/dL (ref 30.0–36.0)
MCV: 88.7 fL (ref 78.0–100.0)
MONOS PCT: 6 %
Monocytes Absolute: 0.6 10*3/uL (ref 0.1–1.0)
Neutro Abs: 7.6 10*3/uL (ref 1.7–7.7)
Neutrophils Relative %: 75 %
PLATELETS: 283 10*3/uL (ref 150–400)
RBC: 4.43 MIL/uL (ref 3.87–5.11)
RDW: 14.3 % (ref 11.5–15.5)
WBC: 10.2 10*3/uL (ref 4.0–10.5)

## 2016-12-19 LAB — URINALYSIS, ROUTINE W REFLEX MICROSCOPIC
Bilirubin Urine: NEGATIVE
Glucose, UA: NEGATIVE mg/dL
HGB URINE DIPSTICK: NEGATIVE
Ketones, ur: NEGATIVE mg/dL
Leukocytes, UA: NEGATIVE
NITRITE: NEGATIVE
PROTEIN: NEGATIVE mg/dL
Specific Gravity, Urine: 1.019 (ref 1.005–1.030)
pH: 5 (ref 5.0–8.0)

## 2016-12-19 LAB — SURGICAL PCR SCREEN
MRSA, PCR: NEGATIVE
STAPHYLOCOCCUS AUREUS: NEGATIVE

## 2016-12-19 LAB — PROTIME-INR
INR: 1.11
PROTHROMBIN TIME: 14.3 s (ref 11.4–15.2)

## 2016-12-19 LAB — APTT: aPTT: 35 seconds (ref 24–36)

## 2016-12-19 NOTE — Progress Notes (Signed)
PCP  Dr. Judith Part  Cardiologist  Dr. Rennis Golden   Clearance letter in chart  CXR repeated because PCP told pt. Her diaphragm had moved since she had pneumonia in September.

## 2016-12-19 NOTE — Pre-Procedure Instructions (Signed)
Tamara [name]  12/19/2016      [id number] - [place], [place] - [address] AT [facility] OF [address] [facility] [place] [address] Phone: [phone] Fax: [phone]    Your procedure is scheduled on 12-29-2016 Friday .  Report to [facility] Admitting at 5:30 A.M.   Call this number if you have problems the morning of surgery:  [phone]   Remember:  Do not eat food or drink liquids after midnight.   Take these medicines the morning of surgery with A SIP OF WATER Tylenol if needed,carvedilol(Coreg),Flonase nasal spray if needed,levothyroxine(Synthroid),Tramadol if needed,    STOP ASPIRIN,ANTIINFLAMATORIES (IBUPROFEN,ALEVE,MOTRIN,ADVIL,GOODY'S POWDERS),HERBAL SUPPLEMENTS,FISH OIL,AND VITAMINS 5-7 DAYS PRIOR TO SURGERY   Do not wear jewelry, make-up or nail polish.  Do not wear lotions, powders, or perfumes, or deoderant.  Do not shave 48 hours prior to surgery.  Men may shave face and neck.  Do not bring valuables to the hospital.  [facility] is not responsible for any belongings or valuables.  Contacts, dentures or bridgework may not be worn into surgery.  Leave your suitcase in the car.  After surgery it may be brought to your room.  For patients admitted to the hospital, discharge time will be determined by your treatment team.  Patients discharged the day of surgery will not be allowed to drive home.    [facility] - Preparing for Surgery  Before surgery, you can play an important role.  Because skin is not sterile, your skin needs to be as free of germs as possible.  You can reduce the number of germs on you skin by washing with CHG (chlorahexidine gluconate) soap before surgery.  CHG is an antiseptic cleaner which kills germs and bonds with the skin to continue killing germs even after washing.  Please DO NOT use if you have an allergy to CHG or antibacterial soaps.  If your skin becomes reddened/irritated stop  using the CHG and inform your nurse when you arrive at Short Stay.  Do not shave (including legs and underarms) for at least 48 hours prior to the first CHG shower.  You may shave your face.  Please follow these instructions carefully:   1.  Shower with CHG Soap the night before surgery and the                                morning of Surgery.  2.  If you choose to wash your hair, wash your hair first as usual with your       normal shampoo.  3.  After you shampoo, rinse your hair and body thoroughly to remove the                      Shampoo.  4.  Use CHG as you would any other liquid soap.  You can apply chg directly       to the skin and wash gently with scrungie or a clean washcloth.  5.  Apply the CHG Soap to your body ONLY FROM THE NECK DOWN.        Do not use on open wounds or open sores.  Avoid contact with your eyes,       ears, mouth and genitals (private parts).  Wash genitals (private parts)       with your normal soap.  6.  Wash thoroughly, paying special attention to the area where your surgery        will be performed.  7.  Thoroughly rinse your body with warm water from the neck down.  8.  DO NOT shower/wash with your normal soap after using and rinsing off       the CHG Soap.  9.  Pat yourself dry with a clean towel.            10.  Wear clean pajamas.            11.  Place clean sheets on your bed the night of your first shower and do not        sleep with pets.  Day of Surgery  Do not apply any lotions/deoderants the morning of surgery.  Please wear clean clothes to the hospital/surgery center.    Please read over the following fact sheets that you were given. MRSA Information and Surgical Site Infection Prevention,Incentive Spirometry

## 2016-12-20 LAB — HEMOGLOBIN A1C
HEMOGLOBIN A1C: 7.7 % — AB (ref 4.8–5.6)
Mean Plasma Glucose: 174 mg/dL

## 2016-12-20 LAB — URINE CULTURE: CULTURE: NO GROWTH

## 2016-12-28 MED ORDER — SODIUM CHLORIDE 0.9 % IV SOLN: INTRAVENOUS | Status: DC

## 2016-12-28 MED ORDER — TRANEXAMIC ACID 1000 MG/10ML IV SOLN
1000.0000 mg | INTRAVENOUS | Status: AC
  Administered 2016-12-29: 1000 mg via INTRAVENOUS
  Filled 2016-12-28: qty 10

## 2016-12-28 MED ORDER — BUPIVACAINE LIPOSOME 1.3 % IJ SUSP
20.0000 mL | INTRAMUSCULAR | Status: AC
  Administered 2016-12-29: 20 mL
  Filled 2016-12-28: qty 20

## 2016-12-28 MED ORDER — DEXTROSE 5 % IV SOLN
3.0000 g | INTRAVENOUS | Status: AC
  Administered 2016-12-29: 3 g via INTRAVENOUS
  Filled 2016-12-28: qty 3000

## 2016-12-29 ENCOUNTER — Encounter (HOSPITAL_COMMUNITY)
Admission: RE | Disposition: A | Discharge status: Skilled Nursing Facility | Payer: Self-pay | Source: Ambulatory Visit | Attending: Orthopedic Surgery

## 2016-12-29 ENCOUNTER — Inpatient Hospital Stay (HOSPITAL_COMMUNITY): Payer: Medicare Other | Admitting: Certified Registered Nurse Anesthetist

## 2016-12-29 ENCOUNTER — Encounter (HOSPITAL_COMMUNITY): Payer: Self-pay | Admitting: Certified Registered Nurse Anesthetist

## 2016-12-29 ENCOUNTER — Inpatient Hospital Stay (HOSPITAL_COMMUNITY)
Admission: RE | Admit: 2016-12-29 | Discharge: 2017-01-01 | DRG: 470 | Disposition: A | Discharge status: Skilled Nursing Facility | Payer: Medicare Other | Source: Ambulatory Visit | Attending: Orthopedic Surgery | Admitting: Orthopedic Surgery

## 2016-12-29 DIAGNOSIS — I471 Supraventricular tachycardia: Secondary | ICD-10-CM | POA: Diagnosis present

## 2016-12-29 DIAGNOSIS — E1140 Type 2 diabetes mellitus with diabetic neuropathy, unspecified: Secondary | ICD-10-CM | POA: Diagnosis present

## 2016-12-29 DIAGNOSIS — E119 Type 2 diabetes mellitus without complications: Secondary | ICD-10-CM

## 2016-12-29 DIAGNOSIS — E1165 Type 2 diabetes mellitus with hyperglycemia: Secondary | ICD-10-CM | POA: Diagnosis not present

## 2016-12-29 DIAGNOSIS — Z79899 Other long term (current) drug therapy: Secondary | ICD-10-CM

## 2016-12-29 DIAGNOSIS — Z6841 Body Mass Index (BMI) 40.0 and over, adult: Secondary | ICD-10-CM

## 2016-12-29 DIAGNOSIS — B372 Candidiasis of skin and nail: Secondary | ICD-10-CM | POA: Diagnosis present

## 2016-12-29 DIAGNOSIS — I2510 Atherosclerotic heart disease of native coronary artery without angina pectoris: Secondary | ICD-10-CM | POA: Diagnosis present

## 2016-12-29 DIAGNOSIS — B356 Tinea cruris: Secondary | ICD-10-CM

## 2016-12-29 DIAGNOSIS — Z96651 Presence of right artificial knee joint: Secondary | ICD-10-CM

## 2016-12-29 DIAGNOSIS — E1122 Type 2 diabetes mellitus with diabetic chronic kidney disease: Secondary | ICD-10-CM | POA: Diagnosis present

## 2016-12-29 DIAGNOSIS — G4730 Sleep apnea, unspecified: Secondary | ICD-10-CM | POA: Diagnosis present

## 2016-12-29 DIAGNOSIS — N183 Chronic kidney disease, stage 3 (moderate): Secondary | ICD-10-CM | POA: Diagnosis present

## 2016-12-29 DIAGNOSIS — E031 Congenital hypothyroidism without goiter: Secondary | ICD-10-CM

## 2016-12-29 DIAGNOSIS — M25562 Pain in left knee: Secondary | ICD-10-CM | POA: Diagnosis present

## 2016-12-29 DIAGNOSIS — I110 Hypertensive heart disease with heart failure: Secondary | ICD-10-CM | POA: Diagnosis present

## 2016-12-29 DIAGNOSIS — I5032 Chronic diastolic (congestive) heart failure: Secondary | ICD-10-CM | POA: Diagnosis present

## 2016-12-29 DIAGNOSIS — I10 Essential (primary) hypertension: Secondary | ICD-10-CM | POA: Diagnosis present

## 2016-12-29 DIAGNOSIS — J45909 Unspecified asthma, uncomplicated: Secondary | ICD-10-CM | POA: Diagnosis present

## 2016-12-29 DIAGNOSIS — Z885 Allergy status to narcotic agent status: Secondary | ICD-10-CM

## 2016-12-29 DIAGNOSIS — I5030 Unspecified diastolic (congestive) heart failure: Secondary | ICD-10-CM | POA: Diagnosis present

## 2016-12-29 DIAGNOSIS — M1712 Unilateral primary osteoarthritis, left knee: Secondary | ICD-10-CM | POA: Diagnosis present

## 2016-12-29 DIAGNOSIS — I252 Old myocardial infarction: Secondary | ICD-10-CM | POA: Diagnosis not present

## 2016-12-29 DIAGNOSIS — E1136 Type 2 diabetes mellitus with diabetic cataract: Secondary | ICD-10-CM | POA: Diagnosis present

## 2016-12-29 DIAGNOSIS — E039 Hypothyroidism, unspecified: Secondary | ICD-10-CM | POA: Diagnosis present

## 2016-12-29 HISTORY — DX: Nosocomial condition: Y95

## 2016-12-29 HISTORY — DX: Hypotension, unspecified: I95.9

## 2016-12-29 HISTORY — DX: Non-ST elevation (NSTEMI) myocardial infarction: I21.4

## 2016-12-29 HISTORY — DX: Pneumonia, unspecified organism: J18.9

## 2016-12-29 HISTORY — DX: Sepsis, unspecified organism: A41.9

## 2016-12-29 HISTORY — PX: TOTAL KNEE ARTHROPLASTY: SHX125

## 2016-12-29 LAB — GLUCOSE, CAPILLARY
GLUCOSE-CAPILLARY: 151 mg/dL — AB (ref 65–99)
GLUCOSE-CAPILLARY: 163 mg/dL — AB (ref 65–99)
Glucose-Capillary: 134 mg/dL — ABNORMAL HIGH (ref 65–99)
Glucose-Capillary: 168 mg/dL — ABNORMAL HIGH (ref 65–99)
Glucose-Capillary: 135 mg/dL — ABNORMAL HIGH (ref 65–99)

## 2016-12-29 LAB — TYPE AND SCREEN
ABO/RH(D): A POS
ANTIBODY SCREEN: POSITIVE

## 2016-12-29 SURGERY — ARTHROPLASTY, KNEE, TOTAL
Anesthesia: Spinal | Laterality: Left

## 2016-12-29 MED ORDER — HYDROCODONE-ACETAMINOPHEN 5-325 MG PO TABS: 1.0000 | ORAL_TABLET | ORAL | 0 refills | Status: DC | PRN

## 2016-12-29 MED ORDER — TRAMADOL HCL 50 MG PO TABS: 100.0000 mg | ORAL_TABLET | Freq: Four times a day (QID) | ORAL | 0 refills | Status: DC | PRN

## 2016-12-29 MED ORDER — INSULIN ASPART 100 UNIT/ML ~~LOC~~ SOLN: 0.0000 [IU] | Freq: Every day | SUBCUTANEOUS | Status: DC

## 2016-12-29 MED ORDER — ALUM & MAG HYDROXIDE-SIMETH 200-200-20 MG/5ML PO SUSP: 30.0000 mL | ORAL | Status: DC | PRN

## 2016-12-29 MED ORDER — HYDROMORPHONE HCL 1 MG/ML IJ SOLN
INTRAMUSCULAR | Status: AC
  Filled 2016-12-29: qty 1

## 2016-12-29 MED ORDER — FENTANYL CITRATE (PF) 100 MCG/2ML IJ SOLN
INTRAMUSCULAR | Status: AC
  Filled 2016-12-29: qty 2

## 2016-12-29 MED ORDER — LEVOTHYROXINE SODIUM 75 MCG PO TABS
150.0000 ug | ORAL_TABLET | Freq: Every day | ORAL | Status: DC
  Administered 2016-12-30 – 2017-01-01 (×3): 150 ug via ORAL
  Filled 2016-12-29 (×4): qty 2

## 2016-12-29 MED ORDER — CHLORHEXIDINE GLUCONATE 4 % EX LIQD: 60.0000 mL | Freq: Once | CUTANEOUS | Status: DC

## 2016-12-29 MED ORDER — FENTANYL CITRATE (PF) 100 MCG/2ML IJ SOLN
25.0000 ug | INTRAMUSCULAR | Status: DC | PRN
  Administered 2016-12-29: 25 ug via INTRAVENOUS

## 2016-12-29 MED ORDER — LACTATED RINGERS IV SOLN
INTRAVENOUS | Status: DC | PRN
  Administered 2016-12-29: 07:00:00 via INTRAVENOUS

## 2016-12-29 MED ORDER — MENTHOL 3 MG MT LOZG: 1.0000 | LOZENGE | OROMUCOSAL | Status: DC | PRN

## 2016-12-29 MED ORDER — FLUTICASONE PROPIONATE 50 MCG/ACT NA SUSP: 1.0000 | Freq: Two times a day (BID) | NASAL | Status: DC | PRN

## 2016-12-29 MED ORDER — PHENOL 1.4 % MT LIQD: 1.0000 | OROMUCOSAL | Status: DC | PRN

## 2016-12-29 MED ORDER — PROMETHAZINE HCL 25 MG PO TABS
12.5000 mg | ORAL_TABLET | Freq: Four times a day (QID) | ORAL | Status: DC | PRN
  Administered 2016-12-29 – 2016-12-31 (×5): 12.5 mg via ORAL
  Filled 2016-12-29 (×5): qty 1

## 2016-12-29 MED ORDER — BUPIVACAINE-EPINEPHRINE (PF) 0.5% -1:200000 IJ SOLN
INTRAMUSCULAR | Status: AC
  Filled 2016-12-29: qty 30

## 2016-12-29 MED ORDER — TRANEXAMIC ACID 1000 MG/10ML IV SOLN
2000.0000 mg | INTRAVENOUS | Status: AC
  Administered 2016-12-29: 2000 mg via TOPICAL
  Filled 2016-12-29: qty 20

## 2016-12-29 MED ORDER — MIDAZOLAM HCL 2 MG/2ML IJ SOLN
INTRAMUSCULAR | Status: AC
  Filled 2016-12-29: qty 2

## 2016-12-29 MED ORDER — ALBUTEROL SULFATE HFA 108 (90 BASE) MCG/ACT IN AERS
INHALATION_SPRAY | RESPIRATORY_TRACT | Status: DC | PRN
  Administered 2016-12-29 (×2): 2 via RESPIRATORY_TRACT

## 2016-12-29 MED ORDER — HYDROMORPHONE HCL 1 MG/ML IJ SOLN
INTRAMUSCULAR | Status: DC | PRN
  Administered 2016-12-29 (×2): 0.5 mg via INTRAVENOUS

## 2016-12-29 MED ORDER — PHENYLEPHRINE HCL 10 MG/ML IJ SOLN
INTRAVENOUS | Status: DC | PRN
  Administered 2016-12-29: 25 ug/min via INTRAVENOUS

## 2016-12-29 MED ORDER — PROPOFOL 10 MG/ML IV BOLUS
INTRAVENOUS | Status: AC
  Filled 2016-12-29: qty 20

## 2016-12-29 MED ORDER — OXYCODONE HCL 5 MG/5ML PO SOLN: 5.0000 mg | Freq: Once | ORAL | Status: DC | PRN

## 2016-12-29 MED ORDER — SUCCINYLCHOLINE CHLORIDE 200 MG/10ML IV SOSY
PREFILLED_SYRINGE | INTRAVENOUS | Status: AC
  Filled 2016-12-29: qty 10

## 2016-12-29 MED ORDER — ALBUTEROL SULFATE HFA 108 (90 BASE) MCG/ACT IN AERS
INHALATION_SPRAY | RESPIRATORY_TRACT | Status: AC
  Filled 2016-12-29: qty 6.7

## 2016-12-29 MED ORDER — PROPOFOL 500 MG/50ML IV EMUL
INTRAVENOUS | Status: DC | PRN
  Administered 2016-12-29: 25 ug/kg/min via INTRAVENOUS

## 2016-12-29 MED ORDER — ONDANSETRON HCL 4 MG/2ML IJ SOLN
INTRAMUSCULAR | Status: DC | PRN
  Administered 2016-12-29: 4 mg via INTRAVENOUS

## 2016-12-29 MED ORDER — HYDROMORPHONE HCL 1 MG/ML IJ SOLN: 0.5000 mg | INTRAMUSCULAR | Status: DC | PRN

## 2016-12-29 MED ORDER — APIXABAN 2.5 MG PO TABS
2.5000 mg | ORAL_TABLET | Freq: Two times a day (BID) | ORAL | Status: DC
  Administered 2016-12-30 – 2017-01-01 (×5): 2.5 mg via ORAL
  Filled 2016-12-29 (×5): qty 1

## 2016-12-29 MED ORDER — METOCLOPRAMIDE HCL 5 MG PO TABS: 5.0000 mg | ORAL_TABLET | Freq: Three times a day (TID) | ORAL | Status: DC | PRN

## 2016-12-29 MED ORDER — INSULIN ASPART 100 UNIT/ML ~~LOC~~ SOLN
0.0000 [IU] | Freq: Three times a day (TID) | SUBCUTANEOUS | Status: DC
  Administered 2016-12-29 – 2016-12-30 (×4): 4 [IU] via SUBCUTANEOUS
  Administered 2016-12-30: 3 [IU] via SUBCUTANEOUS
  Administered 2016-12-31 (×2): 4 [IU] via SUBCUTANEOUS
  Administered 2017-01-01: 3 [IU] via SUBCUTANEOUS

## 2016-12-29 MED ORDER — SODIUM CHLORIDE 0.9% FLUSH
INTRAVENOUS | Status: DC | PRN
  Administered 2016-12-29: 50 mL via INTRAVENOUS

## 2016-12-29 MED ORDER — NYSTATIN 100000 UNIT/GM EX POWD
Freq: Three times a day (TID) | CUTANEOUS | Status: DC
  Administered 2016-12-29 – 2017-01-01 (×9): via TOPICAL
  Filled 2016-12-29: qty 15

## 2016-12-29 MED ORDER — TRAMADOL HCL 50 MG PO TABS
100.0000 mg | ORAL_TABLET | Freq: Four times a day (QID) | ORAL | Status: DC | PRN
  Administered 2016-12-29 – 2016-12-31 (×6): 100 mg via ORAL
  Filled 2016-12-29 (×6): qty 2

## 2016-12-29 MED ORDER — ALBUMIN HUMAN 5 % IV SOLN
INTRAVENOUS | Status: DC | PRN
  Administered 2016-12-29: 08:00:00 via INTRAVENOUS

## 2016-12-29 MED ORDER — CEFAZOLIN SODIUM-DEXTROSE 2-4 GM/100ML-% IV SOLN
2.0000 g | Freq: Four times a day (QID) | INTRAVENOUS | Status: AC
  Administered 2016-12-29 (×2): 2 g via INTRAVENOUS
  Filled 2016-12-29 (×2): qty 100

## 2016-12-29 MED ORDER — FLUCONAZOLE 100 MG PO TABS
100.0000 mg | ORAL_TABLET | Freq: Every day | ORAL | Status: DC
  Administered 2016-12-29 – 2017-01-01 (×4): 100 mg via ORAL
  Filled 2016-12-29 (×4): qty 1

## 2016-12-29 MED ORDER — LIDOCAINE 2% (20 MG/ML) 5 ML SYRINGE
INTRAMUSCULAR | Status: AC
  Filled 2016-12-29: qty 5

## 2016-12-29 MED ORDER — LIDOCAINE HCL (CARDIAC) 20 MG/ML IV SOLN
INTRAVENOUS | Status: DC | PRN
  Administered 2016-12-29: 60 mg via INTRATRACHEAL

## 2016-12-29 MED ORDER — FUROSEMIDE 10 MG/ML IJ SOLN
20.0000 mg | Freq: Once | INTRAMUSCULAR | Status: AC
  Administered 2016-12-29: 20 mg via INTRAVENOUS
  Filled 2016-12-29: qty 2

## 2016-12-29 MED ORDER — NITROGLYCERIN 0.4 MG SL SUBL: 0.4000 mg | SUBLINGUAL_TABLET | SUBLINGUAL | Status: DC | PRN

## 2016-12-29 MED ORDER — TRANEXAMIC ACID 1000 MG/10ML IV SOLN
1000.0000 mg | Freq: Once | INTRAVENOUS | Status: AC
  Administered 2016-12-29: 1000 mg via INTRAVENOUS
  Filled 2016-12-29: qty 10

## 2016-12-29 MED ORDER — APIXABAN 2.5 MG PO TABS: 2.5000 mg | ORAL_TABLET | Freq: Two times a day (BID) | ORAL | 0 refills | Status: DC

## 2016-12-29 MED ORDER — OXYMETAZOLINE HCL 0.05 % NA SOLN
NASAL | Status: AC
  Filled 2016-12-29: qty 15

## 2016-12-29 MED ORDER — HYDROCODONE-ACETAMINOPHEN 5-325 MG PO TABS
1.0000 | ORAL_TABLET | ORAL | Status: DC | PRN
  Administered 2016-12-29 – 2017-01-01 (×7): 2 via ORAL
  Filled 2016-12-29 (×7): qty 2

## 2016-12-29 MED ORDER — SODIUM CHLORIDE 0.9 % IR SOLN
Status: DC | PRN
  Administered 2016-12-29: 1000 mL

## 2016-12-29 MED ORDER — MICONAZOLE NITRATE 2 % EX CREA: 1.0000 "application " | TOPICAL_CREAM | Freq: Two times a day (BID) | CUTANEOUS | 0 refills | Status: DC

## 2016-12-29 MED ORDER — FENTANYL CITRATE (PF) 250 MCG/5ML IJ SOLN
INTRAMUSCULAR | Status: DC | PRN
  Administered 2016-12-29: 50 ug via INTRAVENOUS
  Administered 2016-12-29 (×8): 25 ug via INTRAVENOUS

## 2016-12-29 MED ORDER — ROPIVACAINE HCL 5 MG/ML IJ SOLN
INTRAMUSCULAR | Status: DC | PRN
  Administered 2016-12-29: 20 mL via PERINEURAL

## 2016-12-29 MED ORDER — FENTANYL CITRATE (PF) 250 MCG/5ML IJ SOLN
INTRAMUSCULAR | Status: AC
  Filled 2016-12-29: qty 5

## 2016-12-29 MED ORDER — ONDANSETRON HCL 4 MG/2ML IJ SOLN
INTRAMUSCULAR | Status: AC
  Filled 2016-12-29: qty 2

## 2016-12-29 MED ORDER — METOCLOPRAMIDE HCL 5 MG/ML IJ SOLN
5.0000 mg | Freq: Three times a day (TID) | INTRAMUSCULAR | Status: DC | PRN
  Administered 2016-12-29: 10 mg via INTRAVENOUS
  Filled 2016-12-29: qty 2

## 2016-12-29 MED ORDER — CARVEDILOL 3.125 MG PO TABS
3.1250 mg | ORAL_TABLET | Freq: Two times a day (BID) | ORAL | Status: DC
  Administered 2016-12-29 – 2016-12-31 (×5): 3.125 mg via ORAL
  Filled 2016-12-29 (×6): qty 1

## 2016-12-29 MED ORDER — OXYMETAZOLINE HCL 0.05 % NA SOLN
NASAL | Status: DC | PRN
  Administered 2016-12-29: 2 via NASAL

## 2016-12-29 MED ORDER — ONDANSETRON HCL 4 MG/2ML IJ SOLN: 4.0000 mg | Freq: Four times a day (QID) | INTRAMUSCULAR | Status: DC | PRN

## 2016-12-29 MED ORDER — OXYCODONE HCL 5 MG PO TABS: 5.0000 mg | ORAL_TABLET | Freq: Once | ORAL | Status: DC | PRN

## 2016-12-29 MED ORDER — KETAMINE HCL-SODIUM CHLORIDE 100-0.9 MG/10ML-% IV SOSY
PREFILLED_SYRINGE | INTRAVENOUS | Status: AC
  Filled 2016-12-29: qty 10

## 2016-12-29 MED ORDER — PROPOFOL 500 MG/50ML IV EMUL
INTRAVENOUS | Status: AC
  Filled 2016-12-29: qty 50

## 2016-12-29 MED ORDER — SODIUM CHLORIDE 0.9 % IV SOLN
INTRAVENOUS | Status: DC
  Administered 2016-12-29 – 2016-12-30 (×2): via INTRAVENOUS

## 2016-12-29 MED ORDER — ARTIFICIAL TEARS OPHTHALMIC OINT
TOPICAL_OINTMENT | OPHTHALMIC | Status: AC
  Filled 2016-12-29: qty 3.5

## 2016-12-29 MED ORDER — BUPIVACAINE IN DEXTROSE 0.75-8.25 % IT SOLN
INTRATHECAL | Status: DC | PRN
  Administered 2016-12-29: 1.7 mL via INTRATHECAL

## 2016-12-29 MED ORDER — METOPROLOL TARTRATE 5 MG/5ML IV SOLN
INTRAVENOUS | Status: AC
  Filled 2016-12-29: qty 5

## 2016-12-29 MED ORDER — GLYCOPYRROLATE 0.2 MG/ML IJ SOLN
INTRAMUSCULAR | Status: DC | PRN
  Administered 2016-12-29: 0.2 mg via INTRAVENOUS

## 2016-12-29 MED ORDER — BUPIVACAINE-EPINEPHRINE 0.5% -1:200000 IJ SOLN
INTRAMUSCULAR | Status: DC | PRN
  Administered 2016-12-29: 30 mL

## 2016-12-29 MED ORDER — PROPOFOL 10 MG/ML IV BOLUS
INTRAVENOUS | Status: DC | PRN
  Administered 2016-12-29: 40 mg via INTRAVENOUS
  Administered 2016-12-29: 20 mg via INTRAVENOUS
  Administered 2016-12-29 (×2): 30 mg via INTRAVENOUS
  Administered 2016-12-29: 40 mg via INTRAVENOUS
  Administered 2016-12-29 (×2): 20 mg via INTRAVENOUS

## 2016-12-29 MED ORDER — MICONAZOLE NITRATE 2 % EX CREA
TOPICAL_CREAM | Freq: Two times a day (BID) | CUTANEOUS | Status: DC
  Administered 2016-12-29 – 2017-01-01 (×7): via TOPICAL
  Filled 2016-12-29: qty 14

## 2016-12-29 MED ORDER — INSULIN ASPART 100 UNIT/ML ~~LOC~~ SOLN
6.0000 [IU] | Freq: Three times a day (TID) | SUBCUTANEOUS | Status: DC
  Administered 2016-12-30 – 2017-01-01 (×7): 6 [IU] via SUBCUTANEOUS

## 2016-12-29 MED ORDER — METOPROLOL TARTARATE 1 MG/ML SYRINGE (5ML)
Status: DC | PRN
  Administered 2016-12-29 (×2): 2 mg via INTRAVENOUS
  Administered 2016-12-29: 1 mg via INTRAVENOUS

## 2016-12-29 SURGICAL SUPPLY — 70 items
BANDAGE ACE 4X5 VEL STRL LF (GAUZE/BANDAGES/DRESSINGS) ×1 IMPLANT
BANDAGE ACE 6X5 VEL STRL LF (GAUZE/BANDAGES/DRESSINGS) ×3 IMPLANT
BANDAGE ESMARK 6X9 LF (GAUZE/BANDAGES/DRESSINGS) ×1 IMPLANT
BLADE SAGITTAL 13X1.27X60 (BLADE) ×1 IMPLANT
BLADE SAGITTAL 13X1.27X60MM (BLADE) ×1
BLADE SAGITTAL 25.0X1.19X90 (BLADE) ×2 IMPLANT
BLADE SAGITTAL 25.0X1.19X90MM (BLADE) ×1
BLADE SAW SAG 90X13X1.27 (BLADE) ×3 IMPLANT
BNDG CMPR 9X6 STRL LF SNTH (GAUZE/BANDAGES/DRESSINGS) ×1
BNDG ESMARK 6X9 LF (GAUZE/BANDAGES/DRESSINGS) ×3
BONE CEMENT GENTAMICIN (Cement) ×6 IMPLANT
BOWL SMART MIX CTS (DISPOSABLE) ×3 IMPLANT
CAP KNEE TOTAL 3 SIGMA ×2 IMPLANT
CEMENT BONE GENTAMICIN 40 (Cement) IMPLANT
COVER SURGICAL LIGHT HANDLE (MISCELLANEOUS) ×3 IMPLANT
CUFF TOURNIQUET SINGLE 34IN LL (TOURNIQUET CUFF) ×3 IMPLANT
CUFF TOURNIQUET SINGLE 44IN (TOURNIQUET CUFF) IMPLANT
DRAPE INCISE IOBAN 66X45 STRL (DRAPES) ×2 IMPLANT
DRAPE ORTHO SPLIT 77X108 STRL (DRAPES) ×6
DRAPE SURG ORHT 6 SPLT 77X108 (DRAPES) ×2 IMPLANT
DRAPE U-SHAPE 47X51 STRL (DRAPES) ×3 IMPLANT
DRSG ADAPTIC 3X8 NADH LF (GAUZE/BANDAGES/DRESSINGS) ×3 IMPLANT
DRSG PAD ABDOMINAL 8X10 ST (GAUZE/BANDAGES/DRESSINGS) ×6 IMPLANT
DURAPREP 26ML APPLICATOR (WOUND CARE) ×5 IMPLANT
ELECT REM PT RETURN 9FT ADLT (ELECTROSURGICAL) ×3
ELECTRODE REM PT RTRN 9FT ADLT (ELECTROSURGICAL) ×1 IMPLANT
EVACUATOR 1/8 PVC DRAIN (DRAIN) IMPLANT
FACESHIELD WRAPAROUND (MASK) ×6 IMPLANT
FACESHIELD WRAPAROUND OR TEAM (MASK) ×2 IMPLANT
FLOSEAL 10ML (HEMOSTASIS) IMPLANT
GAUZE SPONGE 4X4 12PLY STRL (GAUZE/BANDAGES/DRESSINGS) ×3 IMPLANT
GLOVE BIOGEL PI IND STRL 8 (GLOVE) ×4 IMPLANT
GLOVE BIOGEL PI INDICATOR 8 (GLOVE) ×4
GLOVE ORTHO TXT STRL SZ7.5 (GLOVE) ×3 IMPLANT
GLOVE SURG ORTHO 8.0 STRL STRW (GLOVE) ×3 IMPLANT
GOWN STRL REUS W/ TWL LRG LVL3 (GOWN DISPOSABLE) ×2 IMPLANT
GOWN STRL REUS W/ TWL XL LVL3 (GOWN DISPOSABLE) ×1 IMPLANT
GOWN STRL REUS W/TWL 2XL LVL3 (GOWN DISPOSABLE) ×3 IMPLANT
GOWN STRL REUS W/TWL LRG LVL3 (GOWN DISPOSABLE) ×6
GOWN STRL REUS W/TWL XL LVL3 (GOWN DISPOSABLE) ×3
HANDPIECE INTERPULSE COAX TIP (DISPOSABLE) ×3
HOOD PEEL AWAY FACE SHEILD DIS (HOOD) ×1 IMPLANT
IMMOBILIZER KNEE 22 UNIV (SOFTGOODS) ×2 IMPLANT
KIT BASIN OR (CUSTOM PROCEDURE TRAY) ×3 IMPLANT
KIT ROOM TURNOVER OR (KITS) ×3 IMPLANT
MANIFOLD NEPTUNE II (INSTRUMENTS) ×3 IMPLANT
NEEDLE 22X1 1/2 (OR ONLY) (NEEDLE) ×6 IMPLANT
NS IRRIG 1000ML POUR BTL (IV SOLUTION) ×3 IMPLANT
PACK TOTAL JOINT (CUSTOM PROCEDURE TRAY) ×3 IMPLANT
PAD ARMBOARD 7.5X6 YLW CONV (MISCELLANEOUS) ×6 IMPLANT
PAD CAST 4YDX4 CTTN HI CHSV (CAST SUPPLIES) ×1 IMPLANT
PADDING CAST COTTON 4X4 STRL (CAST SUPPLIES) ×3
PADDING CAST COTTON 6X4 STRL (CAST SUPPLIES) ×3 IMPLANT
PIN STEINMAN FIXATION KNEE (PIN) IMPLANT
SET HNDPC FAN SPRY TIP SCT (DISPOSABLE) ×1 IMPLANT
STAPLER VISISTAT 35W (STAPLE) ×1 IMPLANT
SUCTION FRAZIER HANDLE 10FR (MISCELLANEOUS) ×2
SUCTION TUBE FRAZIER 10FR DISP (MISCELLANEOUS) ×1 IMPLANT
SUT ETHIBOND NAB CT1 #1 30IN (SUTURE) ×9 IMPLANT
SUT VIC AB 0 CT1 27 (SUTURE) ×3
SUT VIC AB 0 CT1 27XBRD ANBCTR (SUTURE) ×1 IMPLANT
SUT VIC AB 2-0 CT1 27 (SUTURE) ×6
SUT VIC AB 2-0 CT1 TAPERPNT 27 (SUTURE) ×2 IMPLANT
SYR CONTROL 10ML LL (SYRINGE) ×6 IMPLANT
TOWEL OR 17X24 6PK STRL BLUE (TOWEL DISPOSABLE) ×3 IMPLANT
TOWEL OR 17X26 10 PK STRL BLUE (TOWEL DISPOSABLE) ×3 IMPLANT
TRAY CATH 16FR W/PLASTIC CATH (SET/KITS/TRAYS/PACK) IMPLANT
TRAY FOLEY BAG SILVER LF 14FR (CATHETERS) ×2 IMPLANT
TRAY FOLEY W/METER SILVER 16FR (SET/KITS/TRAYS/PACK) IMPLANT
WATER STERILE IRR 1000ML POUR (IV SOLUTION) ×1 IMPLANT

## 2016-12-29 NOTE — Consult Note (Signed)
WOC Nurse wound consult note Reason for Consult:Erythematous satellite lesions to left flank under abdominal pannus, consistent with fungal overgrowth.  Is receiving oral Diflucan and Nystatin topically.  Will implement Interdry Ag skin fold management to combat yeast and bacteria and wick away excess moisture.  Wound type:Moisture associated skin damage to skin fold, abdomen Pressure Injury POA: N/A Measurement:4 cm x 3 cm to abdomen and 4 cm x 2.4 cm to upper thigh beneath pannus.  Wound RUE:AVWUbed:pink and moist Drainage (amount, consistency, odor) none noted.  Periwound:intact Dressing procedure/placement/frequency: Measure and cut length of InterDry Ag+ to fit in skin folds that have skin breakdown Tuck InterDry  Ag+ fabric into skin folds in a single layer, allow for 2 inches of overhang from skin edges to allow for wicking to occur May remove to bathe; dry area thoroughly and then tuck into affected areas again Do not apply any creams or ointments when using InterDry Ag+ DO NOT THROW AWAY FOR 5 DAYS unless soiled with stool DO NOT [facility] product, this will inactivate the silver in the material  New sheet of Interdry Ag+ should be applied after 5 days of use if patient continues to have skin breakdown   Will not follow at this time.  Please re-consult if needed.  Maple HudsonKaren Randi Poullard RN BSN CWON Pager [phone]

## 2016-12-29 NOTE — Anesthesia Procedure Notes (Signed)
Spinal  Patient location during procedure: OR Start time: 12/29/2016 7:35 AM End time: 12/29/2016 7:40 AM Staffing Anesthesiologist: Chaney MallingHODIERNE, Mister Krahenbuhl Performed: anesthesiologist  Preanesthetic Checklist Completed: patient identified, surgical consent, pre-op evaluation, timeout performed, IV checked, risks and benefits discussed and monitors and equipment checked Spinal Block Patient position: sitting Prep: DuraPrep Patient monitoring: cardiac monitor, continuous pulse ox and blood pressure Approach: midline Location: L3-4 Injection technique: single-shot Needle Needle type: Pencan  Needle gauge: 24 G Needle length: 9 cm Assessment Sensory level: T10 Additional Notes Functioning IV was confirmed and monitors were applied. Sterile prep and drape, including hand hygiene and sterile gloves were used. The patient was positioned and the spine was prepped. The skin was anesthetized with lidocaine.  Free flow of clear CSF was obtained prior to injecting local anesthetic into the CSF.  The spinal needle aspirated freely following injection.  The needle was carefully withdrawn.  The patient tolerated the procedure well.

## 2016-12-29 NOTE — Anesthesia Preprocedure Evaluation (Signed)
Anesthesia Evaluation  Patient identified by MRN, date of birth, ID band Patient awake    Reviewed: Allergy & Precautions, H&P , NPO status , Patient's Chart, lab work & pertinent test results  Airway Mallampati: II   Neck ROM: full    Dental   Pulmonary shortness of breath, asthma , sleep apnea ,    breath sounds clear to auscultation       Cardiovascular hypertension, +CHF  + dysrhythmias Supra Ventricular Tachycardia  Rhythm:regular Rate:Normal     Neuro/Psych  Headaches,  Neuromuscular disease    GI/Hepatic   Endo/Other  diabetes, Type 2Hypothyroidism Morbid obesity  Renal/GU Renal InsufficiencyRenal disease     Musculoskeletal  (+) Arthritis ,   Abdominal   Peds  Hematology   Anesthesia Other Findings   Reproductive/Obstetrics                             Anesthesia Physical Anesthesia Plan  ASA: III  Anesthesia Plan: Spinal   Post-op Pain Management:  Regional for Post-op pain   Induction: Intravenous  Airway Management Planned: Simple Face Mask  Additional Equipment:   Intra-op Plan:   Post-operative Plan:   Informed Consent: I have reviewed the patients History and Physical, chart, labs and discussed the procedure including the risks, benefits and alternatives for the proposed anesthesia with the patient or authorized representative who has indicated his/her understanding and acceptance.     Plan Discussed with: CRNA, Anesthesiologist and Surgeon  Anesthesia Plan Comments:         Anesthesia Quick Evaluation

## 2016-12-29 NOTE — Transfer of Care (Signed)
Immediate Anesthesia Transfer of Care Note  Patient: Tamara [name]  Procedure(s) Performed: Procedure(s): TOTAL KNEE ARTHROPLASTY (Left)  Patient Location: PACU  Anesthesia Type:Spinal  Level of Consciousness: awake, alert  and patient cooperative  Airway & Oxygen Therapy: Patient Spontanous Breathing and Patient connected to nasal cannula oxygen  Post-op Assessment: Report given to RN, Post -op Vital signs reviewed and stable, Patient moving all extremities X 4 and Patient able to stick tongue midline  Post vital signs: Reviewed and stable  Last Vitals:  Vitals:   12/29/16 0550 12/29/16 1000  BP: (!) 127/48   Pulse: 82   Resp: 20   Temp: 36.5 C (!) 36 C    Last Pain:  Vitals:   12/29/16 0550  TempSrc: Oral      Patients Stated Pain Goal: 1 (12/29/16 0606)  Complications: No apparent anesthesia complications

## 2016-12-29 NOTE — Anesthesia Postprocedure Evaluation (Signed)
Anesthesia Post Note  Patient: Tamara [name]  Procedure(s) Performed: Procedure(s) (LRB): TOTAL KNEE ARTHROPLASTY (Left)  Patient location during evaluation: PACU Anesthesia Type: Spinal Level of consciousness: oriented and awake and alert Pain management: pain level controlled Vital Signs Assessment: post-procedure vital signs reviewed and stable Respiratory status: spontaneous breathing, respiratory function stable and patient connected to nasal cannula oxygen Cardiovascular status: blood pressure returned to baseline and stable Postop Assessment: no headache and no backache Anesthetic complications: no       Last Vitals:  Vitals:   12/29/16 1101 12/29/16 1122  BP:  117/60  Pulse:  75  Resp:  18  Temp: 36.1 C 36.6 C    Last Pain:  Vitals:   12/29/16 1122  TempSrc: Oral  PainSc:                  Sarthak Rubenstein S

## 2016-12-29 NOTE — Evaluation (Signed)
Physical Therapy Evaluation Patient Details Name: Tamara [name] MRN: [id number] DOB: [birth year] Today's Date: 12/29/2016   History of Present Illness  Pt is s/p elective L TKA secondary to L Knee OA. Pt also has wound to L flank under abdominal pannus. 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   Clinical Impression  Pt is s/p elective surgery above with deficits below. PTA, pt was independent with mobility using a RW. Upon evaluation, pt reporting she didn't feel good, but was willing to participate. Pt also limited by post op pain and weakness. Pt requiring min A for mobility this session. Pt reports she will be going to SNF at d/c to increase independence with functional mobility. Agree with d/c disposition. Will continue to follow acutely and progress mobility according to pt tolerance.     Follow Up Recommendations SNF;Supervision/Assistance - 24 hour    Equipment Recommendations  None recommended by PT    Recommendations for Other Services       Precautions / Restrictions Precautions Precautions: Knee Precaution Booklet Issued: Yes (comment) Precaution Comments: Reviewed supine ther ex with pt. Restrictions Weight Bearing Restrictions: Yes LLE Weight Bearing: Weight bearing as tolerated      Mobility  Bed Mobility Overal bed mobility: Needs Assistance Bed Mobility: Supine to Sit     Supine to sit: Min assist;HOB elevated     General bed mobility comments: Min A for LLE management. Pt relying heavily on use of bed rails and elevated HOB for trunk elevation.   Transfers Overall transfer level: Needs assistance Equipment used: Rolling walker (2 wheeled) Transfers: Sit to/from Stand Sit to Stand: Min assist         General transfer comment: Min A for lift assist. Required extra time to complete transfer. Verbal cues for appropriate hand placement.    Ambulation/Gait Ambulation/Gait assistance: Min assist Ambulation Distance (Feet): 5 Feet Assistive device: Rolling walker (2 wheeled) Gait Pattern/deviations: Step-to pattern;Decreased step length - left;Decreased weight shift to left;Antalgic;Wide base of support;Trunk flexed Gait velocity: Decreased Gait velocity interpretation: Below normal speed for age/gender General Gait Details: Slow, antalgic gait. Distance limited to chair secondary to nausea and pain. Verbal cues for upright posture and proximity to device.   Stairs            Wheelchair Mobility    Modified Rankin (Stroke Patients Only)       Balance Overall balance assessment: Needs assistance Sitting-balance support: No upper extremity supported;Feet supported Sitting balance-Leahy Scale: Good     Standing balance support: Bilateral upper extremity supported;During functional activity Standing balance-Leahy Scale: Poor Standing balance comment: Reliant on RW for stability                              Pertinent Vitals/Pain Pain Assessment: Faces Faces Pain Scale: Hurts even more Pain Location: L knee Pain Descriptors / Indicators: Aching;Grimacing;Operative site guarding Pain Intervention(s): Limited activity within patient's tolerance;Monitored during session;Repositioned    Home Living Family/patient expects to be discharged to:: Skilled nursing facility                 Additional Comments: Plan to go to [facility] prior to return home.    Prior Function Level of Independence: Independent with assistive device(s)         Comments: Was using RW PTA     Hand Dominance   Dominant Hand:  Right    Extremity/Trunk Assessment   Upper Extremity Assessment Upper Extremity Assessment: Defer to OT evaluation    Lower Extremity Assessment Lower Extremity Assessment: LLE deficits/detail LLE Deficits / Details: Sensory in tact. Deficits consistent with post op pain and weakness.      Cervical / Trunk Assessment Cervical / Trunk Assessment: Kyphotic  Communication   Communication: No difficulties  Cognition Arousal/Alertness: Awake/alert Behavior During Therapy: WFL for tasks assessed/performed Overall Cognitive Status: Within Functional Limits for tasks assessed                                        General Comments General comments (skin integrity, edema, etc.): Pt's daughter present throughout session.     Exercises Total Joint Exercises Ankle Circles/Pumps: AROM;Both;10 reps;Supine Quad Sets: AROM;Left;10 reps;Supine Towel Squeeze: AROM;Both;10 reps;Supine Hip ABduction/ADduction: AROM;Left;10 reps;Supine   Assessment/Plan    PT Assessment Patient needs continued PT services  PT Problem List Decreased strength;Decreased range of motion;Decreased activity tolerance;Decreased balance;Decreased mobility;Decreased knowledge of use of DME;Decreased knowledge of precautions;Pain       PT Treatment Interventions DME instruction;Gait training;Functional mobility training;Therapeutic activities;Balance training;Therapeutic exercise;Neuromuscular re-education;Patient/family education    PT Goals (Current goals can be found in the Care Plan section)  Acute Rehab PT Goals Patient Stated Goal: to go to rehab to get stronger PT Goal Formulation: With patient Time For Goal Achievement: 01/05/17 Potential to Achieve Goals: Good    Frequency 7X/week   Barriers to discharge        Co-evaluation               AM-PAC PT "6 Clicks" Daily Activity  Outcome Measure Difficulty turning over in bed (including adjusting bedclothes, sheets and blankets)?: A Lot Difficulty moving from lying on back to sitting on the side of the bed? : Total Difficulty sitting down on and standing up from a chair with arms (e.g., wheelchair, bedside commode, etc,.)?: Total Help needed moving to and from a bed to chair (including a wheelchair)?: A Little Help  needed walking in hospital room?: A Little Help needed climbing 3-5 steps with a railing? : A Lot 6 Click Score: 12    End of Session Equipment Utilized During Treatment: Gait belt;Left knee immobilizer Activity Tolerance: Patient limited by pain Patient left: in chair;with call bell/phone within reach;with family/visitor present Nurse Communication: Mobility status PT Visit Diagnosis: Other abnormalities of gait and mobility (R26.89);Pain Pain - Right/Left: Left Pain - part of body: Knee    Time: [id number] PT Time Calculation (min) (ACUTE ONLY): 18 min   Charges:   PT Evaluation $PT Eval Low Complexity: 1 Procedure PT Treatments $Gait Training: 8-22 mins   PT G Codes:        Margot ChimesBrittany Smith, PT, DPT  Acute Rehabilitation Services  Pager: [phone]   Melvyn NovasBrittany L Smith 12/29/2016, 4:48 PM

## 2016-12-29 NOTE — Op Note (Signed)
Tamara [name], Tamara [name]                ACCOUNT NO.:  [id number]  MEDICAL RECORD NO.:  [id number]  LOCATION:                                 FACILITY:  PHYSICIAN:  Dyke Brackett, M.D.         DATE OF BIRTH:  DATE OF PROCEDURE:  12/29/2016 DATE OF DISCHARGE:                              OPERATIVE REPORT   PREOPERATIVE DIAGNOSIS:  Left knee osteoarthritis with varus deformity and flexion contracture.  POSTOPERATIVE DIAGNOSIS:  Left knee osteoarthritis with varus deformity and flexion contracture.  OPERATION:  Left total knee replacement (Sigma cemented size femur 3, tibia 3, patella is 35 mm all-poly 3 peg patella with 12.5 mm bearing).  SURGEON:  Dyke Brackett, M.D.  ASSISTANT:  Margart Sickles, PA-C.  TOURNIQUET TIME:  60 minutes.  ANESTHESIA:  Spinal anesthetic with local supplementation.  DESCRIPTION OF PROCEDURE:  Supine positioning, tourniquet with inflation to 350.  Exsanguination of the leg.  Straight skin incision with medial parapatellar approach to the knee made.  We cut an 11 mm 5-degree valgus cut on the distal femur.  The most diseased medial compartment was the medial side, did a moderate amount of stripping due to the varus deformity on the medial side of the knee, cut about 3-4 mm below the most diseased medial compartment.  With this, did an additional 2 mm resection to obtain full extension.  Extension gap measured at 12.5 mm. Femur was sized to be size 3 placement of all-in-1 cutting block and appropriate degree of external rotation with anterior and posterior chamfers as well as the anterior and posterior cuts.  PCL was released. Excess menisci were removed from the posterior aspect of the knee with stripping of the capsule.  Flexion gap equaled the extension gap at 12.5 mm.  Cut a keel for the tibia.  Tibial trial base plate was placed as well.  Then, the box cut for the femur.  Trials were placed on the femur and tibia.  Cut the patella for the size of a 35  mm all-poly trial leaving about 15-16 mm of native patella.  Again, the patient with the trials and had full extension, excellent stability, good balancing of the ligaments, no tendency for bearing spin out.  Cement was inserted in the doughy state.  The patient had a history of diabetes and UTIs, we elected to use antibiotic-impregnated cement with gentamicin 1 g per each batch for a total of 2 batches.  Mixture of Exparel and Marcaine with epinephrine infiltrated into the capsule and soft tissues as well. Final components were inserted, tibia followed by femur and patella. Cement was allowed to harden with placement of the trial bearing.  Trial bearing was removed.  Excess cement was removed from posterior aspect of the knee.  Tourniquet was released.  There was no excessive bleeding noted.  Final bearing was placed and all parameters deemed to be acceptable.  Closure of the retinaculum and capsule with interrupted #1 Ethibond with subcutaneous tissues with 0 and 2-0 Vicryl and skin with stapling device.  Taken to recovery room in stable condition.  Lightly compressive sterile dressing and the knee immobilizer applied.  Dyke BrackettW. D. Khaalid Lefkowitz, M.D.     WDC/MEDQ  D:  12/29/2016  T:  12/29/2016  Job:  [id number]

## 2016-12-29 NOTE — Consult Note (Signed)
Medical Consultation   Tamara [name]  ZOX:[id number]  DOB: 08-07-1941  DOA: 12/29/2016  PCP: Alan MulderMORAYATI,SHAMIL J, MD   Outpatient Specialists: Orthopedic service  Requesting physician: Madelon Lipsaffrey, MD  Reason for consultation: left groin rash    History of Present Illness: Tamara Citrindna H Seivert is an 76 y.o. female with previous history of DM and CKD associated with that, hypothroydism, CHF-grade 2 diastolic dysfunction, normal EF 60-65% by echo in 2017, hypertension, PSVT, right TKA, non-ST elevation MI during the postoperative recovery after the right knee replacement  in September 2017, lung scarring  with history of with history of recurrent pneumonia and bronchitis who underwent total left knee arthroplasty today and we were asked to see patient in consultation to address the left groin rash.  Patient reported that recently she has been treated with ampicillin for sinusitis and ended up having vaginal yeast infection and left groin erythema with weeping yellowish exudate. She was treated herself with clotrimazole intravaginally and externally and applied nystatin powder to the groin site. Patient reported improvement in symptoms but still has persistent erythema and complains of itching and burning in that area but denied any vaginal discomfort.   She denied chest pain and shortness of breath, nausea vomiting, constipations or diarrhea.  Patient reported that been instructed to discontinue glipizide p.m. on single and the Hemoglobin A1cwas 6.2%  at that time. She had recent follow-up with PCP and her hemoglobin A1c increased to 7.2%, but she has not been instructed to restart glipizide   Review of Systems:  ROS As per HPI otherwise 10 point review of systems negative.    Past Medical History: Past Medical History:  Diagnosis Date  . Anemia    distant hx of  . Arthritis    KNEES,HANDS,WRIST  . Asthma    allergy related  . 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 Bronchitis   . Cardiomegaly   . CHF (congestive heart failure) (HCC)    stopped using diuretics due to kidney issues, watching salt intake  . Chronic cough   . Chronic kidney disease    due to DM  . Complication of anesthesia    BP bottomed out during Achilles Tendon repair >4632yrs ago  . Constipation due to pain medication   . Diabetes mellitus without complication (HCC)    type 2  not on medications  . Diverticulitis   . Dysrhythmia   . Gout   . Headache    Sinus headaches  . 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 History of blood transfusion   . Hypertension   . Hypothyroidism   . 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 Obesity   . Palpitations   . Pneumonia    hx of- 05/02/16- last time approx - 6 years ago  . Shortness of breath dyspnea    with exertion  . Sleep apnea    does not use CPAP  . SVT (supraventricular tachycardia) (HCC)   . Wheezing     Past Surgical History: Past Surgical History:  Procedure Laterality Date  . ABDOMINAL HYSTERECTOMY    . ABDOMINAL HYSTERECTOMY  1984  . ACHILLES TENDON REPAIR Left   . CARDIAC CATHETERIZATION     [place]  . CARDIAC CATHETERIZATION N/A 05/18/2016   Procedure: Left Heart Cath and Coronary Angiography;  Surgeon: Nile Dearhristopher D  Clifton James, MD;  Location: [facility] INVASIVE CV LAB;  Service: Cardiovascular;  Laterality: N/A;  . CATARACT EXTRACTION W/PHACO Left 02/10/2015   Procedure: CATARACT EXTRACTION PHACO AND INTRAOCULAR LENS PLACEMENT (IOC);  Surgeon: Lockie Mola, MD;  Location: [facility];  Service: Ophthalmology;  Laterality: Left;  DIABETIC  . CATARACT EXTRACTION W/PHACO Right 02/24/2015   Procedure: CATARACT EXTRACTION PHACO AND INTRAOCULAR LENS PLACEMENT (IOC);  Surgeon: Lockie Mola, MD;  Location: [facility];  Service: Ophthalmology;  Laterality: Right;  . CESAREAN SECTION  1978  . CHOLECYSTECTOMY  1986  . EYE SURGERY Bilateral    Cataract   . TOTAL KNEE  ARTHROPLASTY Right 05/12/2016   Procedure: TOTAL KNEE ARTHROPLASTY;  Surgeon: Frederico Hamman, MD;  Location: [facility] OR;  Service: Orthopedics;  Laterality: Right;     Allergies:   Allergies  Allergen Reactions  . Codeine Nausea And Vomiting     Social History:  reports that she has never smoked. She has quit using smokeless tobacco. She reports that she does not drink alcohol or use drugs.   Family History: Family History  Problem Relation Age of Onset  . Lung cancer Mother   . Heart attack Mother   . Heart Problems Father   . Hypertension Father   . Other Father     respiratory problems  . Ovarian cancer Maternal Grandmother   . Cancer Maternal Grandfather   . Stroke Paternal Grandmother   . Diabetes Brother   . Liver cancer Brother   . COPD Sister   . Other Sister     sepsis  . Heart attack Son   . Heart attack Son   . Rheum arthritis Daughter   . Rheum arthritis Daughter   . Thyroid cancer Daughter   . Hepatitis C Daughter       Physical Exam: Vitals:   12/29/16 1059 12/29/16 1100 12/29/16 1101 12/29/16 1122  BP: 122/67   117/60  Pulse: 74 71  75  Resp: 16 16  18   Temp:   97 F (36.1 C) 97.9 F (36.6 C)  TempSrc:    Oral  SpO2: 95% 92%  98%  Weight:        Constitutional: Alert and awake, oriented x3, not in any acute distress. Eyes: PERLA, EOMI, irises appear normal, anicteric sclera  ENMT: external ears and nose appear normal, normal hearing            Lips appears normal, oropharynx mucosa, tongue, posterior pharynx appear normal  Neck: neck appears normal, no masses, normal ROM, no thyromegaly, no JVD  CVS: S1-S2 clear, no murmur rubs or gallops,1+ right LE edema, normal pedal pulses  Respiratory: dry basilar crackles to auscultation bilaterally, no wheezing, rales or rhonchi.             Respiratory effort normal. No accessory muscle use.  Abdomen: soft, obese nontender, nondistended, normal bowel sounds,          unable to appreciate  hepatosplenomegaly or masses due to body habitus  Musculoskeletal: no cyanosis, clubbing, right pretibial edema, left LE in dependent position, wrapped in ACE wrap                                Joint/bones/muscle exam, strength, contractures or atrophy Neuro: Cranial nerves II-XII intact, strength, sensation, reflexes Psych: judgement and insight appear normal, stable mood and affect, mental status Skin: dark red erythema of the left groin, skin remains moist and  denuded in around the groin creased  Data reviewed:  I have personally reviewed following labs and imaging studies  Labs:  CBC: No results for input(s): WBC, NEUTROABS, HGB, HCT, MCV, PLT in the last 168 hours.  Basic Metabolic Panel: No results for input(s): NA, K, CL, CO2, GLUCOSE, BUN, CREATININE, CALCIUM, MG, PHOS in the last 168 hours. GFR Estimated Creatinine Clearance: 62.2 mL/min (by C-G formula based on SCr of 0.96 mg/dL). Liver Function Tests: No results for input(s): AST, ALT, ALKPHOS, BILITOT, PROT, ALBUMIN in the last 168 hours. No results for input(s): LIPASE, AMYLASE in the last 168 hours. No results for input(s): AMMONIA in the last 168 hours. Coagulation profile No results for input(s): INR, PROTIME in the last 168 hours.  Cardiac Enzymes: No results for input(s): CKTOTAL, CKMB, CKMBINDEX, TROPONINI in the last 168 hours. BNP: Invalid input(s): POCBNP CBG:  Recent Labs Lab 12/29/16 0556 12/29/16 1022 12/29/16 1148  GLUCAP 134* 168* 151*   D-Dimer No results for input(s): DDIMER in the last 72 hours. Hgb A1c No results for input(s): HGBA1C in the last 72 hours. Lipid Profile No results for input(s): CHOL, HDL, LDLCALC, TRIG, CHOLHDL, LDLDIRECT in the last 72 hours. Thyroid function studies No results for input(s): TSH, T4TOTAL, T3FREE, THYROIDAB in the last 72 hours.  Invalid input(s): FREET3 Anemia work up No results for input(s): VITAMINB12, FOLATE, FERRITIN, TIBC, IRON, RETICCTPCT in the  last 72 hours. Urinalysis    Component Value Date/Time   COLORURINE YELLOW 12/19/2016 1255   APPEARANCEUR CLEAR 12/19/2016 1255   APPEARANCEUR Clear 08/01/2013 1926   LABSPEC 1.019 12/19/2016 1255   LABSPEC 1.015 08/01/2013 1926   PHURINE 5.0 12/19/2016 1255   GLUCOSEU NEGATIVE 12/19/2016 1255   GLUCOSEU Negative 08/01/2013 1926   HGBUR NEGATIVE 12/19/2016 1255   BILIRUBINUR NEGATIVE 12/19/2016 1255   BILIRUBINUR Negative 08/01/2013 1926   KETONESUR NEGATIVE 12/19/2016 1255   PROTEINUR NEGATIVE 12/19/2016 1255   NITRITE NEGATIVE 12/19/2016 1255   LEUKOCYTESUR NEGATIVE 12/19/2016 1255   LEUKOCYTESUR Negative 08/01/2013 1926     Microbiology Recent Results (from the past 240 hour(s))  Surgical pcr screen     Status: None   Collection Time: 12/19/16 12:47 PM  Result Value Ref Range Status   MRSA, PCR NEGATIVE NEGATIVE Final   Staphylococcus aureus NEGATIVE NEGATIVE Final    Comment:        The Xpert SA Assay (FDA approved for NASAL specimens in patients over 84 years of age), is one component of a comprehensive surveillance program.  Test performance has been validated by [facility] for patients greater than or equal to 22 year old. It is not intended to diagnose infection nor to guide or monitor treatment.   Urine culture     Status: None   Collection Time: 12/19/16 12:55 PM  Result Value Ref Range Status   Specimen Description URINE, CLEAN CATCH  Final   Special Requests NONE  Final   Culture NO GROWTH  Final   Report Status 12/20/2016 FINAL  Final       Inpatient Medications:   Scheduled Meds: . [START ON 12/30/2016] apixaban  2.5 mg Oral Q12H  . carvedilol  3.125 mg Oral BID WC  . fluconazole  100 mg Oral Daily  . insulin aspart  0-20 Units Subcutaneous TID WC  . insulin aspart  0-5 Units Subcutaneous QHS  . insulin aspart  6 Units Subcutaneous TID WC  . [START ON 12/30/2016] levothyroxine  150 mcg Oral QAC breakfast  .  miconazole   Topical BID  .  nystatin   Topical TID   Continuous Infusions: . sodium chloride 75 mL/hr at 12/29/16 1150  .  ceFAZolin (ANCEF) IV    . tranexamic acid       Radiological Exams on Admission: No results found.  Impression/Recommendations Principal Problem:   Primary localized osteoarthritis of left knee Active Problems:   Paroxysmal SVT (supraventricular tachycardia) (HCC)   Hypothyroidism   Diabetes mellitus (HCC)   CHF with left ventricular diastolic dysfunction, NYHA class 2 (HCC)   Yeast infection of the skin   Hypertension   Status post left-sided total knee arthroplasty - managed by orthopedic service  Skin yeast infection - will initiate oral Diflucan, topically continue nystatin powder Obtain wound care consult  Diabetes mellitus type 2  Continue sliding scale insulin and carb controlled diet  Hypothyroidism Continue Synthroid  CHF with known LV diastolic dysfunction-grade 2 Lasix was discontinued d/t low BP, but she has pretibial swelling on the right Will give a low-dose IV Lasix and transitioned to home oral dose  Monitor I/O, daily weight  PSVT - will check EKG, continue Coreg with parameters   Thank you for this consultation.  Our [facility] hospitalist team will follow the patient with you.   Time Spent: 65 minutes  Raymon Mutton M.D. [facility] 12/29/2016, 12:42 PM

## 2016-12-29 NOTE — Brief Op Note (Signed)
12/29/2016  10:00 AM  PATIENT:  Donalee CitrinEdna H Maute  76 y.o. female  PRE-OPERATIVE DIAGNOSIS:  OA LEFT KNEE  POST-OPERATIVE DIAGNOSIS:  OA LEFT KNEE  PROCEDURE:  Procedure(s): TOTAL KNEE ARTHROPLASTY (Left)  SURGEON:  Surgeon(s) and Role:    * Frederico Hammananiel Caffrey, MD - Primary  PHYSICIAN ASSISTANT: Margart SicklesJoshua Shirlyn Savin, PA-C  ASSISTANTS: OR staff x1   ANESTHESIA:   local, regional, spinal and IV sedation  EBL:  Total I/O In: 1050 [I.V.:800; IV Piggyback:250] Out: 150 [Urine:100; Blood:50]  BLOOD ADMINISTERED:none  DRAINS: none   LOCAL MEDICATIONS USED:  MARCAINE     SPECIMEN:  No Specimen  DISPOSITION OF SPECIMEN:  N/A  COUNTS:  YES  TOURNIQUET:   Total Tourniquet Time Documented: Thigh (Left) - 63 minutes Total: Thigh (Left) - 63 minutes   DICTATION: .Other Dictation: Dictation Number unknown  PLAN OF CARE: Admit to inpatient   PATIENT DISPOSITION:  PACU - hemodynamically stable.   Delay start of Pharmacological VTE agent (>24hrs) due to surgical blood loss or risk of bleeding: yes

## 2016-12-29 NOTE — Care Management Note (Signed)
Case Management Note  Patient Details  Name: Tamara [name] MRN: [id number] Date of Birth: 08-13-1941  Subjective/Objective: 76 yr old female s/p left total knee arthroplasty.                   Action/Plan: Case manger spoke with patient concerning discharge plan and DME needs. Patient states she has Rolling walker and 3in1 at home, but will be going to [facility] in [place] for shortterm rehab. Patient lives alone and will have no support at discharge. Case manager notified Social worker of discharge plan.    Expected Discharge Date:    pending              Expected Discharge Plan:  Skilled Nursing Facility  In-House Referral:  Clinical Social Work  Discharge planning Services  CM Consult  Post Acute Care Choice:  NA Choice offered to:  Patient  DME Arranged:  CPM (Has RW and 3in1 at home) DME Agency:  [employer]  HH Arranged:  NA HH Agency:  [facility]  Status of Service:  In process, will continue to follow  If discussed at Long Length of Stay Meetings, dates discussed:    Additional Comments:  Durenda GuthrieBrady, Toneshia Coello Naomi, RN 12/29/2016, 4:11 PM

## 2016-12-29 NOTE — Progress Notes (Signed)
Orthopedic Tech Progress Note Patient Details:  Tamara [name] October 20, 1940 [id number]  CPM Left Knee CPM Left Knee: On Left Knee Flexion (Degrees): 90 Left Knee Extension (Degrees): 0 Additional Comments: trapeze bar patient helper   Nikki DomCrawford, Jaelen Gellerman 12/29/2016, 10:34 AM Viewed order from doctor's order list

## 2016-12-29 NOTE — Interval H&P Note (Signed)
History and Physical Interval Note:  12/29/2016 7:31 AM  Tamara CitrinEdna H Crotteau  has presented today for surgery, with the diagnosis of OA LEFT KNEE  The various methods of treatment have been discussed with the patient and family. After consideration of risks, benefits and other options for treatment, the patient has consented to  Procedure(s): TOTAL KNEE ARTHROPLASTY (Left) as a surgical intervention .  The patient's history has been reviewed, patient examined, no change in status, stable for surgery.  I have reviewed the patient's chart and labs.  Questions were answered to the patient's satisfaction.     Therisa Mennella JR,W D

## 2016-12-29 NOTE — H&P (View-Only) (Signed)
TOTAL KNEE ADMISSION H&P  Patient is being admitted for left total knee arthroplasty.  Subjective:  Chief Complaint:left knee pain.  HPI: Tamara [name], 76 y.o. female, has a history of pain and functional disability in the left knee due to arthritis and has failed non-surgical conservative treatments for greater than 12 weeks to includeNSAID's and/or analgesics, corticosteriod injections and activity modification.  Onset of symptoms was gradual, starting 6 years ago with gradually worsening course since that time. The patient noted no past surgery on the left knee(s).  Patient currently rates pain in the left knee(s) at 8 out of 10 with activity. Patient has night pain, worsening of pain with activity and weight bearing, pain that interferes with activities of daily living, pain with passive range of motion, crepitus and joint swelling.  Patient has evidence of periarticular osteophytes and joint space narrowing by imaging studies.There is no active infection.  Patient Active Problem List   Diagnosis Date Noted  . Elevated troponin   . HAP (hospital-acquired pneumonia) 05/16/2016  . Sepsis associated hypotension (HCC) 05/16/2016  . Acute respiratory failure with hypoxia (HCC) 05/16/2016  . NSTEMI (non-ST elevated myocardial infarction) (HCC)   . Low oxygen saturation   . Primary localized osteoarthritis of right knee 05/12/2016  . Paroxysmal SVT (supraventricular tachycardia) (HCC) 03/01/2016  . Preprocedural cardiovascular examination 03/01/2016  . Pain in the chest 03/01/2016   Past Medical History:  Diagnosis Date  . Anemia    distant hx of  . Arthritis    KNEES,HANDS,WRIST  . Asthma    allergy related  . 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 Bronchitis   . Cardiomegaly   . CHF (congestive heart failure) (HCC)    stopped using diuretics due to kidney issues, watching salt intake  . Chronic cough   . Chronic kidney disease    due to DM  . Complication of  anesthesia    BP bottomed out during Achilles Tendon repair >1973yrs ago  . Constipation due to pain medication   . Diabetes mellitus without complication (HCC)    type 2  . Diverticulitis   . Gout   . 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 History of blood transfusion   . Hypertension   . Hypothyroidism   . 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 Obesity   . Palpitations   . Pneumonia    hx of- 05/02/16- last time approx - 6 years ago  . Shortness of breath dyspnea    with exertion  . SVT (supraventricular tachycardia) (HCC)   . Wheezing     Past Surgical History:  Procedure Laterality Date  . ABDOMINAL HYSTERECTOMY    . ABDOMINAL HYSTERECTOMY  1984  . ACHILLES TENDON REPAIR Left   . CARDIAC CATHETERIZATION     [place]  . CARDIAC CATHETERIZATION N/A 05/18/2016   Procedure: Left Heart Cath and Coronary Angiography;  Surgeon: Kathleene Hazelhristopher D McAlhany, MD;  [facility];  Service: Cardiovascular;  Laterality: N/A;  . CATARACT EXTRACTION W/PHACO Left 02/10/2015   Procedure: CATARACT EXTRACTION PHACO AND INTRAOCULAR LENS PLACEMENT (IOC);  Surgeon: Lockie Molahadwick Brasington, MD;  [facility];  Service: Ophthalmology;  Laterality: Left;  DIABETIC  . CATARACT EXTRACTION W/PHACO Right 02/24/2015   Procedure: CATARACT EXTRACTION PHACO AND INTRAOCULAR LENS PLACEMENT (IOC);  Surgeon: Lockie Molahadwick Brasington, MD;  [facility];  Service: Ophthalmology;  Laterality: Right;  .  CESAREAN SECTION  1978  . CHOLECYSTECTOMY  1986  . EYE SURGERY Bilateral    Cataract   . TOTAL KNEE ARTHROPLASTY Right 05/12/2016   Procedure: TOTAL KNEE ARTHROPLASTY;  Surgeon: Frederico Hamman, MD;  [facility];  Service: Orthopedics;  Laterality: Right;     (Not in a hospital admission) Allergies  Allergen Reactions  . Codeine Nausea And Vomiting    Social History  Substance Use Topics  . Smoking status: Never Smoker  . Smokeless tobacco:  Former Neurosurgeon  . Alcohol use No    Family History  Problem Relation Age of Onset  . Lung cancer Mother   . Heart attack Mother   . Heart Problems Father   . Hypertension Father   . Other Father     respiratory problems  . Ovarian cancer Maternal Grandmother   . Cancer Maternal Grandfather   . Stroke Paternal Grandmother   . Diabetes Brother   . Liver cancer Brother   . COPD Sister   . Other Sister     sepsis  . Heart attack Son   . Heart attack Son   . Rheum arthritis Daughter   . Rheum arthritis Daughter   . Thyroid cancer Daughter   . Hepatitis C Daughter      Review of Systems  Constitutional: Positive for weight loss.  HENT: Positive for hearing loss.   Respiratory: Positive for shortness of breath.   Cardiovascular: Positive for chest pain, palpitations and leg swelling.  Gastrointestinal: Positive for constipation, diarrhea, nausea and vomiting.  Genitourinary: Positive for dysuria, frequency, hematuria and urgency.  Musculoskeletal: Positive for joint pain.  Skin: Positive for rash.  Neurological: Positive for dizziness and headaches.  Endo/Heme/Allergies: Bruises/bleeds easily.  Psychiatric/Behavioral: The patient is nervous/anxious.     Objective:  Physical Exam  Constitutional: She is oriented to person, place, and time. She appears well-developed and well-nourished. No distress.  HENT:  Head: Normocephalic and atraumatic.  Nose: Nose normal.  Eyes: Conjunctivae and EOM are normal. Pupils are equal, round, and reactive to light.  Neck: Normal range of motion. Neck supple.  Cardiovascular: Normal rate, regular rhythm, normal heart sounds and intact distal pulses.   Respiratory: Effort normal and breath sounds normal. No respiratory distress. She has no wheezes.  GI: Soft. Bowel sounds are normal. She exhibits no distension. There is no tenderness.  Musculoskeletal:       Left knee: She exhibits normal range of motion. Tenderness found.  Lymphadenopathy:     She has no cervical adenopathy.  Neurological: She is alert and oriented to person, place, and time. No cranial nerve deficit.  Skin: Skin is warm and dry. No rash noted. No erythema.  Psychiatric: She has a normal mood and affect. Her behavior is normal.    Vital signs in last 24 hours: @  Labs:   Estimated body mass index is 50.01 kg/m as calculated from the following:   Height as of 05/18/16:  (1.575 m).   Weight as of 05/20/16: 124 kg (273 lb 6.4 oz).   Imaging Review Plain radiographs demonstrate severe degenerative joint disease of the left knee(s). The overall alignment issignificant varus. The bone quality appears to be good for age and reported activity level.  Assessment/Plan:  End stage arthritis, left knee   The patient history, physical examination, clinical judgment of the provider and imaging studies are consistent with end stage degenerative joint disease of the left knee(s) and total knee arthroplasty is deemed medically necessary. The treatment  options including medical management, injection therapy arthroscopy and arthroplasty were discussed at length. The risks and benefits of total knee arthroplasty were presented and reviewed. The risks due to aseptic loosening, infection, stiffness, patella tracking problems, thromboembolic complications and other imponderables were discussed. The patient acknowledged the explanation, agreed to proceed with the plan and consent was signed. Patient is being admitted for inpatient treatment for surgery, pain control, PT, OT, prophylactic antibiotics, VTE prophylaxis, progressive ambulation and ADL's and discharge planning. The patient is planning to be discharged to skilled nursing facility  

## 2016-12-29 NOTE — Anesthesia Procedure Notes (Signed)
Anesthesia Regional Block: Adductor canal block   Pre-Anesthetic Checklist: ,, timeout performed, Correct Patient, Correct Site, Correct Laterality, Correct Procedure, Correct Position, site marked, Risks and benefits discussed,  Surgical consent,  Pre-op evaluation,  At surgeon's request and post-op pain management  Laterality: Left  Prep: chloraprep       Needles:  Injection technique: Single-shot  Needle Type: Echogenic Needle     Needle Length: 9cm  Needle Gauge: 21     Additional Needles:   Procedures: ultrasound guided,,,,,,,,  Narrative:  Start time: 12/29/2016 7:02 AM End time: 12/29/2016 7:08 AM Injection made incrementally with aspirations every 5 mL.  Performed by: Personally  Anesthesiologist: Pandora Mccrackin  Additional Notes: Pt tolerated the procedure well.

## 2016-12-30 ENCOUNTER — Encounter (HOSPITAL_COMMUNITY): Payer: Self-pay | Admitting: Internal Medicine

## 2016-12-30 DIAGNOSIS — M1712 Unilateral primary osteoarthritis, left knee: Principal | ICD-10-CM

## 2016-12-30 DIAGNOSIS — E039 Hypothyroidism, unspecified: Secondary | ICD-10-CM

## 2016-12-30 DIAGNOSIS — B372 Candidiasis of skin and nail: Secondary | ICD-10-CM

## 2016-12-30 DIAGNOSIS — I471 Supraventricular tachycardia: Secondary | ICD-10-CM

## 2016-12-30 LAB — CBC
HEMATOCRIT: 34.3 % — AB (ref 36.0–46.0)
HEMOGLOBIN: 10.8 g/dL — AB (ref 12.0–15.0)
MCH: 28.4 pg (ref 26.0–34.0)
MCHC: 31.5 g/dL (ref 30.0–36.0)
MCV: 90.3 fL (ref 78.0–100.0)
Platelets: 246 10*3/uL (ref 150–400)
RBC: 3.8 MIL/uL — AB (ref 3.87–5.11)
RDW: 14.3 % (ref 11.5–15.5)
WBC: 12.1 10*3/uL — AB (ref 4.0–10.5)

## 2016-12-30 LAB — BASIC METABOLIC PANEL
Anion gap: 8 (ref 5–15)
BUN: 13 mg/dL (ref 6–20)
CHLORIDE: 98 mmol/L — AB (ref 101–111)
CO2: 26 mmol/L (ref 22–32)
CREATININE: 0.92 mg/dL (ref 0.44–1.00)
Calcium: 8.3 mg/dL — ABNORMAL LOW (ref 8.9–10.3)
GFR calc non Af Amer: 59 mL/min — ABNORMAL LOW (ref >60)
Glucose, Bld: 162 mg/dL — ABNORMAL HIGH (ref 65–99)
POTASSIUM: 4 mmol/L (ref 3.5–5.1)
SODIUM: 132 mmol/L — AB (ref 135–145)

## 2016-12-30 LAB — GLUCOSE, CAPILLARY
GLUCOSE-CAPILLARY: 161 mg/dL — AB (ref 65–99)
GLUCOSE-CAPILLARY: 129 mg/dL — AB (ref 65–99)
Glucose-Capillary: 155 mg/dL — ABNORMAL HIGH (ref 65–99)
Glucose-Capillary: 95 mg/dL (ref 65–99)

## 2016-12-30 NOTE — Discharge Instructions (Signed)
Information on my medicine - ELIQUIS® (apixaban) ° °This medication education was reviewed with me or my healthcare representative as part of my discharge preparation.  The pharmacist that spoke with me during my hospital stay was:  Keierra Nudo P, RPH ° °Why was Eliquis® prescribed for you? °Eliquis® was prescribed for you to reduce the risk of blood clots forming after orthopedic surgery.   ° °What do You need to know about Eliquis®? °Take your Eliquis® TWICE DAILY - one tablet in the morning and one tablet in the evening with or without food.  It would be best to take the dose about the same time each day. ° °If you have difficulty swallowing the tablet whole please discuss with your pharmacist how to take the medication safely. ° °Take Eliquis® exactly as prescribed by your doctor and DO NOT stop taking Eliquis® without talking to the doctor who prescribed the medication.  Stopping without other medication to take the place of Eliquis® may increase your risk of developing a clot. ° °After discharge, you should have regular check-up appointments with your healthcare provider that is prescribing your Eliquis®. ° °What do you do if you miss a dose? °If a dose of ELIQUIS® is not taken at the scheduled time, take it as soon as possible on the same day and twice-daily administration should be resumed.  The dose should not be doubled to make up for a missed dose.  Do not take more than one tablet of ELIQUIS at the same time. ° °Important Safety Information °A possible side effect of Eliquis® is bleeding. You should call your healthcare provider right away if you experience any of the following: °? Bleeding from an injury or your nose that does not stop. °? Unusual colored urine (red or dark brown) or unusual colored stools (red or black). °? Unusual bruising for unknown reasons. °? A serious fall or if you hit your head (even if there is no bleeding). ° °Some medicines may interact with Eliquis® and might increase  your risk of bleeding or clotting while on Eliquis®. To help avoid this, consult your healthcare provider or pharmacist prior to using any new prescription or non-prescription medications, including herbals, vitamins, non-steroidal anti-inflammatory drugs (NSAIDs) and supplements. ° °This website has more information on Eliquis® (apixaban): http://www.eliquis.com/eliquis/home ° °INSTRUCTIONS AFTER JOINT REPLACEMENT  ° °o Remove items at home which could result in a fall. This includes throw rugs or furniture in walking pathways °o ICE to the affected joint every three hours while awake for 30 minutes at a time, for at least the first 3-5 days, and then as needed for pain and swelling.  Continue to use ice for pain and swelling. You may notice swelling that will progress down to the foot and ankle.  This is normal after surgery.  Elevate your leg when you are not up walking on it.   °o Continue to use the breathing machine you got in the hospital (incentive spirometer) which will help keep your temperature down.  It is common for your temperature to cycle up and down following surgery, especially at night when you are not up moving around and exerting yourself.  The breathing machine keeps your lungs expanded and your temperature down. ° ° °DIET:  As you were doing prior to hospitalization, we recommend a well-balanced diet. ° °DRESSING / WOUND CARE / SHOWERING ° °You may change your dressing 3-5 days after surgery.  Then change the dressing every day with sterile gauze.  Please use   good hand washing techniques before changing the dressing.  Do not use any lotions or creams on the incision until instructed by your surgeon. ° °ACTIVITY ° °o Increase activity slowly as tolerated, but follow the weight bearing instructions below.   °o No driving for 6 weeks or until further direction given by your physician.  You cannot drive while taking narcotics.  °o No lifting or carrying greater than 10 lbs. until further directed  by your surgeon. °o Avoid periods of inactivity such as sitting longer than an hour when not asleep. This helps prevent blood clots.  °o You may return to work once you are authorized by your doctor.  ° ° ° °WEIGHT BEARING  ° °Weight bearing as tolerated with assist device (walker, cane, etc) as directed, use it as long as suggested by your surgeon or therapist, typically at least 4-6 weeks. ° ° °EXERCISES ° °Results after joint replacement surgery are often greatly improved when you follow the exercise, range of motion and muscle strengthening exercises prescribed by your doctor. Safety measures are also important to protect the joint from further injury. Any time any of these exercises cause you to have increased pain or swelling, decrease what you are doing until you are comfortable again and then slowly increase them. If you have problems or questions, call your caregiver or physical therapist for advice.  ° °Rehabilitation is important following a joint replacement. After just a few days of immobilization, the muscles of the leg can become weakened and shrink (atrophy).  These exercises are designed to build up the tone and strength of the thigh and leg muscles and to improve motion. Often times heat used for twenty to thirty minutes before working out will loosen up your tissues and help with improving the range of motion but do not use heat for the first two weeks following surgery (sometimes heat can increase post-operative swelling).  ° °These exercises can be done on a training (exercise) mat, on the floor, on a table or on a bed. Use whatever works the best and is most comfortable for you.    Use music or television while you are exercising so that the exercises are a pleasant break in your day. This will make your life better with the exercises acting as a break in your routine that you can look forward to.   Perform all exercises about fifteen times, three times per day or as directed.  You should  exercise both the operative leg and the other leg as well. ° °Exercises include: °  °• Quad Sets - Tighten up the muscle on the front of the thigh (Quad) and hold for 5-10 seconds.   °• Straight Leg Raises - With your knee straight (if you were given a brace, keep it on), lift the leg to 60 degrees, hold for 3 seconds, and slowly lower the leg.  Perform this exercise against resistance later as your leg gets stronger.  °• Leg Slides: Lying on your back, slowly slide your foot toward your buttocks, bending your knee up off the floor (only go as far as is comfortable). Then slowly slide your foot back down until your leg is flat on the floor again.  °• Angel Wings: Lying on your back spread your legs to the side as far apart as you can without causing discomfort.  °• Hamstring Strength:  Lying on your back, push your heel against the floor with your leg straight by tightening up the muscles of your buttocks.    Repeat, but this time bend your knee to a comfortable angle, and push your heel against the floor.  You may put a pillow under the heel to make it more comfortable if necessary.  ° °A rehabilitation program following joint replacement surgery can speed recovery and prevent re-injury in the future due to weakened muscles. Contact your doctor or a physical therapist for more information on knee rehabilitation.  ° ° °CONSTIPATION ° °Constipation is defined medically as fewer than three stools per week and severe constipation as less than one stool per week.  Even if you have a regular bowel pattern at home, your normal regimen is likely to be disrupted due to multiple reasons following surgery.  Combination of anesthesia, postoperative narcotics, change in appetite and fluid intake all can affect your bowels.  ° °YOU MUST use at least one of the following options; they are listed in order of increasing strength to get the job done.  They are all available over the counter, and you may need to use some, POSSIBLY even  all of these options:   ° °Drink plenty of fluids (prune juice may be helpful) and high fiber foods °Colace 100 mg by mouth twice a day  °Senokot for constipation as directed and as needed Dulcolax (bisacodyl), take with full glass of water  °Miralax (polyethylene glycol) once or twice a day as needed. ° °If you have tried all these things and are unable to have a bowel movement in the first 3-4 days after surgery call either your surgeon or your primary doctor.   ° °If you experience loose stools or diarrhea, hold the medications until you stool forms back up.  If your symptoms do not get better within 1 week or if they get worse, check with your doctor.  If you experience "the worst abdominal pain ever" or develop nausea or vomiting, please contact the office immediately for further recommendations for treatment. ° ° °ITCHING:  If you experience itching with your medications, try taking only a single pain pill, or even half a pain pill at a time.  You can also use Benadryl over the counter for itching or also to help with sleep.  ° °TED HOSE STOCKINGS:  Use stockings on both legs until for at least 2 weeks or as directed by physician office. They may be removed at night for sleeping. ° °MEDICATIONS:  See your medication summary on the “After Visit Summary” that nursing will review with you.  You may have some home medications which will be placed on hold until you complete the course of blood thinner medication.  It is important for you to complete the blood thinner medication as prescribed. ° °PRECAUTIONS:  If you experience chest pain or shortness of breath - call 911 immediately for transfer to the hospital emergency department.  ° °If you develop a fever greater that 101 F, purulent drainage from wound, increased redness or drainage from wound, foul odor from the wound/dressing, or calf pain - CONTACT YOUR SURGEON.   °                                                °FOLLOW-UP APPOINTMENTS:  If you do not  already have a post-op appointment, please call the office for an appointment to be seen by your surgeon.  Guidelines for how soon to be seen are   listed in your “After Visit Summary”, but are typically between 1-4 weeks after surgery. ° °OTHER INSTRUCTIONS:  ° °Knee Replacement:  Do not place pillow under knee, focus on keeping the knee straight while resting. CPM instructions: 0-90 degrees, 2 hours in the morning, 2 hours in the afternoon, and 2 hours in the evening. Place foam block, curve side up under heel at all times except when in CPM or when walking.  DO NOT modify, tear, cut, or change the foam block in any way. ° °MAKE SURE YOU:  °• Understand these instructions.  °• Get help right away if you are not doing well or get worse.  ° ° °Thank you for letting us be a part of your medical care team.  It is a privilege we respect greatly.  We hope these instructions will help you stay on track for a fast and full recovery!  ° °

## 2016-12-30 NOTE — Progress Notes (Signed)
SPORTS MEDICINE AND JOINT REPLACEMENT  Georgena SpurlingStephen Lucey, MD    Laurier Nancyolby Fan, PA-C [address], [place], [place]  [id number]                             [phone]   PROGRESS NOTE  Subjective:  negative for Chest Pain  negative for Shortness of Breath  negative for Nausea/Vomiting   negative for Calf Pain  negative for Bowel Movement   Tolerating Diet: yes         Patient reports pain as 4 on 0-10 scale.    Objective: Vital signs in last 24 hours:   Patient Vitals for the past 24 hrs:  BP Temp Temp src Pulse Resp SpO2  12/30/16 0618 (!) 117/54 99.5 F (37.5 C) Oral 86 - 96 %  12/30/16 0000 (!) 109/52 98.5 F (36.9 C) Oral 81 - 98 %  12/29/16 2046 (!) 107/45 98.6 F (37 C) Oral 75 - 93 %  12/29/16 1122 117/60 97.9 F (36.6 C) Oral 75 18 98 %  12/29/16 1101 - 97 F (36.1 C) - - - -  12/29/16 1100 - - - 71 16 92 %  12/29/16 1059 122/67 - - 74 16 95 %  12/29/16 1045 - - - 73 17 94 %  12/29/16 1044 (!) 107/58 - - 71 15 96 %  12/29/16 1030 (!) 112/59 - - 74 12 92 %  12/29/16 1015 - 97.7 F (36.5 C) - 80 16 93 %  12/29/16 1014 (!) 108/36 - - 84 15 97 %  12/29/16 1000 - (!) 96.8 F (36 C) - 77 12 100 %  12/29/16 0959 (!) 112/31 - - 78 12 100 %    @flow {1959:LAST@   Intake/Output from previous day:   05/04 0701 - 05/05 0700 In: 1687.5 [I.V.:1337.5] Out: 2275 [Urine:2225]   Intake/Output this shift:   No intake/output data recorded.   Intake/Output      05/04 0701 - 05/05 0700 05/05 0701 - 05/06 0700   I.V. (mL/kg) 1337.5 (11.4)    IV Piggyback 350    Total Intake(mL/kg) 1687.5 (14.4)    Urine (mL/kg/hr) 2225 (0.8)    Blood 50 (0)    Total Output 2275     Net -587.5             LABORATORY DATA: No results for input(s): WBC, HGB, HCT, PLT in the last 168 hours. No results for input(s): NA, K, CL, CO2, BUN, CREATININE, GLUCOSE, CALCIUM in the last 168 hours. Lab Results  Component Value Date   INR 1.11 12/19/2016   INR 1.48 05/18/2016   INR 2.06  05/16/2016    Examination:  General appearance: alert, cooperative and no distress Extremities: extremities normal, atraumatic, no cyanosis or edema  Wound Exam: clean, dry, intact   Drainage:  None: wound tissue dry  Motor Exam: Quadriceps and Hamstrings Intact  Sensory Exam: Superficial Peroneal, Deep Peroneal and Tibial normal   Assessment:    1 Day Post-Op  Procedure(s) (LRB): TOTAL KNEE ARTHROPLASTY (Left)  ADDITIONAL DIAGNOSIS:  Principal Problem:   Primary localized osteoarthritis of left knee Active Problems:   Paroxysmal SVT (supraventricular tachycardia) (HCC)   Hypothyroidism   Diabetes mellitus (HCC)   CHF with left ventricular diastolic dysfunction, NYHA class 2 (HCC)   Yeast infection of the skin   Hypertension     Plan: Physical Therapy as ordered Weight Bearing as Tolerated (WBAT)  DVT Prophylaxis:  Eloquis  DISCHARGE PLAN: SNF  DISCHARGE NEEDS: HHPT  Patient doing well and progressing with pain control and PT. Anticipate D/C to SNF tomorrow of Monday as soon as bed is available        Gaynel Schaafsma 12/30/2016, 7:11 AM

## 2016-12-30 NOTE — Evaluation (Signed)
Occupational Therapy Evaluation Patient Details Name: Tamara [name] MRN: [id number] DOB: April 17, 1941 Today's Date: 12/30/2016    History of Present Illness Pt is s/p elective L TKA secondary to L Knee OA. Pt also has wound to L flank under abdominal pannus. 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    Clinical Impression   PTA, pt was independent with RW for ADL and functional mobility. Pt lives with her son who works during the day. She currently requires mod assist for LB ADL and toilet transfers. Pt and daughter educated concerning knee precautions and safety during ADL. Pt demonstrates significant functional decline from PLOF and would benefit from continued OT services while admitted to improve independence with ADL and functional mobility. Recommend short-term SNF placement post-acute D/C in order to maximize return to PLOF. OT will continue to follow while admitted.    Follow Up Recommendations  SNF;Supervision/Assistance - 24 hour    Equipment Recommendations  Other (comment) (TBD at next venue of care)    Recommendations for Other Services       Precautions / Restrictions Precautions Precautions: Knee Precaution Booklet Issued: No Precaution Comments: Reviewed knee precautions during ADL Restrictions Weight Bearing Restrictions: Yes LLE Weight Bearing: Weight bearing as tolerated      Mobility Bed Mobility Overal bed mobility: Needs Assistance Bed Mobility: Sit to Supine       Sit to supine: Max assist   General bed mobility comments: Max assist for B LE management to bring onto bed.  Transfers Overall transfer level: Needs assistance Equipment used: Rolling walker (2 wheeled) Transfers: Sit to/from Stand Sit to Stand: Mod assist         General transfer comment: Mod assist for lifting with increased time. VC's for proper hand placement.    Balance Overall  balance assessment: Needs assistance Sitting-balance support: Bilateral upper extremity supported;No upper extremity supported;Feet supported;Single extremity supported Sitting balance-Leahy Scale: Fair Sitting balance - Comments: B UE support at times due to pain    Standing balance support: Bilateral upper extremity supported;During functional activity Standing balance-Leahy Scale: Poor Standing balance comment: Reliant on RW for stability                            ADL either performed or assessed with clinical judgement   ADL Overall ADL's : Needs assistance/impaired Eating/Feeding: Set up;Sitting   Grooming: Set up;Sitting   Upper Body Bathing: Supervision/ safety;Set up;Sitting   Lower Body Bathing: Moderate assistance;Sit to/from stand   Upper Body Dressing : Supervision/safety;Sitting;Set up   Lower Body Dressing: Moderate assistance;Sit to/from stand   Toilet Transfer: Moderate assistance;RW;BSC Toilet Transfer Details (indicate cue type and reason): Mod assist for approximately 5 steps from bed to chair for simulated toilet transfer. Toileting- Clothing Manipulation and Hygiene: Maximal assistance;Sit to/from stand       Functional mobility during ADLs: Moderate assistance;Rolling walker General ADL Comments: Pt requiring significantly increased time for standing ADL and toilet transfers this session.      Vision Baseline Vision/History: Wears glasses Wears Glasses: Reading only Patient Visual Report: No change from baseline Vision Assessment?: No apparent visual deficits     Perception     Praxis      Pertinent Vitals/Pain Pain Assessment: Faces Faces Pain Scale: Hurts whole lot Pain Location: L knee Pain Descriptors / Indicators: Aching;Grimacing;Operative site guarding Pain Intervention(s): Limited activity within patient's tolerance;Monitored during  session;Repositioned     Hand Dominance Right   Extremity/Trunk Assessment Upper  Extremity Assessment Upper Extremity Assessment: Generalized weakness   Lower Extremity Assessment Lower Extremity Assessment: LLE deficits/detail LLE Deficits / Details: Decreased strength and ROM as expected post-operatively.       Communication Communication Communication: No difficulties   Cognition Arousal/Alertness: Lethargic Behavior During Therapy: WFL for tasks assessed/performed Overall Cognitive Status: Within Functional Limits for tasks assessed                                     General Comments       Exercises     Shoulder Instructions      Home Living Family/patient expects to be discharged to:: Skilled nursing facility                                 Additional Comments: Plan to go to [facility] prior to return home.      Prior Functioning/Environment Level of Independence: Independent with assistive device(s)        Comments: Was using RW PTA        OT Problem List: Decreased strength;Decreased range of motion;Decreased activity tolerance;Impaired balance (sitting and/or standing);Decreased safety awareness;Decreased knowledge of use of DME or AE;Decreased knowledge of precautions;Pain      OT Treatment/Interventions: Self-care/ADL training;Therapeutic exercise;Energy conservation;DME and/or AE instruction;Therapeutic activities;Patient/family education;Balance training    OT Goals(Current goals can be found in the care plan section) Acute Rehab OT Goals Patient Stated Goal: to go to rehab to get stronger OT Goal Formulation: With patient Time For Goal Achievement: 01/13/17 Potential to Achieve Goals: Good ADL Goals Pt Will Perform Grooming: with min guard assist;standing Pt Will Perform Lower Body Bathing: with min guard assist;sit to/from stand Pt Will Perform Lower Body Dressing: with min guard assist;sit to/from stand Pt Will Transfer to Toilet: with min guard assist;ambulating;bedside commode (BSC over  toilet) Pt Will Perform Toileting - Clothing Manipulation and hygiene: with min guard assist;sit to/from stand  OT Frequency: Min 1X/week   Barriers to D/C:            Co-evaluation              AM-PAC PT "6 Clicks" Daily Activity     Outcome Measure Help from another person eating meals?: None Help from another person taking care of personal grooming?: None Help from another person toileting, which includes using toliet, bedpan, or urinal?: A Lot Help from another person bathing (including washing, rinsing, drying)?: A Lot Help from another person to put on and taking off regular upper body clothing?: A Little Help from another person to put on and taking off regular lower body clothing?: A Lot 6 Click Score: 17   End of Session Equipment Utilized During Treatment: Gait belt;Rolling walker  Activity Tolerance: Patient tolerated treatment well Patient left: in bed;with call bell/phone within reach;with family/visitor present  OT Visit Diagnosis: Other abnormalities of gait and mobility (R26.89);Pain Pain - Right/Left: Left Pain - part of body: Knee                Time: 1500-1526 OT Time Calculation (min): 26 min Charges:  OT General Charges $OT Visit: 1 Procedure OT Evaluation $OT Eval Moderate Complexity: 1 Procedure OT Treatments $Self Care/Home Management : 8-22 mins G-Codes:     Jamarri Vuncannon A Camren Henthorn, MS OTR/L  Pager: [phone]   Ayah Cozzolino A Roldan Laforest 12/30/2016, 3:37 PM

## 2016-12-30 NOTE — Progress Notes (Signed)
Progress Note    Tamara [name]  WJX:[id number] DOB: Aug 12, 1941  DOA: 12/29/2016 PCP: Alan Mulder, MD    Brief Narrative:   Reason for consult: Follow-up Candida infection  Medical records reviewed and are as summarized below:  Tamara [name] is an 76 y.o. female with a PMH of diabetes, hypothyroidism, chronic diastolic CHF, hypertension, CAD status post non-STEMI in September 2017 who underwent a left total knee arthroplasty 12/29/16 and was noted to have a rash on the left side of her groin for which internal medicine was consulted.  Assessment/Plan:   Principal Problem:   Primary localized osteoarthritis of left knee Status post left TKA 12/29/16. Management per orthopedic surgery.  Active Problems:   Paroxysmal SVT (supraventricular tachycardia) (HCC) Continue Coreg.     Hypothyroidism Continue Synthroid.    Diabetes mellitus (HCC) Currently being managed with insulin resistant SSI Q AC/HS as well as 6 units of meal coverage. CBGs 135-168.    CHF with left ventricular diastolic dysfunction, NYHA class 2 (HCC) Monitor for signs of decompensation.    Yeast infection of the skin Oral Diflucan initiated. Continue nystatin powder.    Hypertension Continue Coreg, blood pressure controlled.    Morbid obesity Body mass index is 46.62 kg/m.   Family Communication/Anticipated D/C date and plan/Code Status   DVT prophylaxis: Eliquis ordered. Code Status: Full Code.  Family Communication: No family at bedside. Disposition Plan: Per primary team   Subjective:   Left knee/leg pain.  No dyspnea, chest pain.    Objective:    Vitals:   12/29/16 1122 12/29/16 2046 12/30/16 0000 12/30/16 0618  BP: 117/60 (!) 107/45 (!) 109/52 (!) 117/54  Pulse: 75 75 81 86  Resp: 18     Temp: 97.9 F (36.6 C) 98.6 F (37 C) 98.5 F (36.9 C) 99.5 F (37.5 C)  TempSrc: Oral Oral Oral Oral  SpO2: 98% 93% 98% 96%  Weight:        Intake/Output Summary (Last 24 hours) at  12/30/16 0854 Last data filed at 12/30/16 0800  Gross per 24 hour  Intake           2412.5 ml  Output             2275 ml  Net            137.5 ml   Filed Weights   12/29/16 0550  Weight: 117.5 kg (259 lb)    Exam: General exam: Appears drowsy and uncomfortable. Respiratory system: Clear to auscultation. Respiratory effort normal. Cardiovascular system: S1 & S2 heard, RRR. No JVD,  rubs, gallops or clicks. No murmurs. Gastrointestinal system: Abdomen is nondistended, soft and nontender. No organomegaly or masses felt. Normal bowel sounds heard. Central nervous system: Drowsy but oriented. No focal neurological deficits. Extremities: Left leg in ACE wrap thigh to knee. Skin: Erythema left groin fold. Psychiatry: Judgement and insight appear normal. Mood & affect flat.   Data Reviewed:   I have personally reviewed following labs and imaging studies:  Labs: Basic Metabolic Panel:  Recent Labs Lab 12/30/16 0647  NA 132*  K 4.0  CL 98*  CO2 26  GLUCOSE 162*  BUN 13  CREATININE 0.92  CALCIUM 8.3*   GFR Estimated Creatinine Clearance: 64.9 mL/min (by C-G formula based on SCr of 0.92 mg/dL). Liver Function Tests: No results for input(s): AST, ALT, ALKPHOS, BILITOT, PROT, ALBUMIN in the last 168 hours. No results for input(s): LIPASE, AMYLASE in the last 168 hours.  No results for input(s): AMMONIA in the last 168 hours. Coagulation profile No results for input(s): INR, PROTIME in the last 168 hours.  CBC:  Recent Labs Lab 12/30/16 0647  WBC 12.1*  HGB 10.8*  HCT 34.3*  MCV 90.3  PLT 246   Cardiac Enzymes: No results for input(s): CKTOTAL, CKMB, CKMBINDEX, TROPONINI in the last 168 hours. BNP (last 3 results) No results for input(s): PROBNP in the last 8760 hours. CBG:  Recent Labs Lab 12/29/16 1022 12/29/16 1148 12/29/16 1630 12/29/16 2151 12/30/16 0621  GLUCAP 168* 151* 163* 135* 161*    Microbiology No results found for this or any previous visit  (from the past 240 hour(s)).  Radiology: No results found.  Medications:   . apixaban  2.5 mg Oral Q12H  . carvedilol  3.125 mg Oral BID WC  . fluconazole  100 mg Oral Daily  . insulin aspart  0-20 Units Subcutaneous TID WC  . insulin aspart  0-5 Units Subcutaneous QHS  . insulin aspart  6 Units Subcutaneous TID WC  . levothyroxine  150 mcg Oral QAC breakfast  . miconazole   Topical BID  . nystatin   Topical TID   Continuous Infusions: . sodium chloride 75 mL/hr at 12/30/16 0217    Medical decision making is of low complexity and therefore this is a level 1 visit. (> 2 problem points, >2 data points, low risk: Need 2 out of 3)   LOS: 1 day   RAMA,CHRISTINA  [facility] Pager [phone]. If unable to reach me by pager, please call my cell phone at [phone].  *Please refer to [email or web address], password [email or web address] to get updated schedule on who will round on this patient, as hospitalists switch teams weekly. If 7PM-7AM, please contact night-coverage at [email or web address], password [email or web address] for any overnight needs.  12/30/2016, 8:54 AM

## 2016-12-30 NOTE — Progress Notes (Signed)
Physical Therapy Treatment Patient Details Name: Tamara [name] MRN: [id number] DOB: 12-26-1940 Today's Date: 12/30/2016    History of Present Illness Pt is s/p elective L TKA secondary to L Knee OA. Pt also has wound to L flank under abdominal pannus. 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     PT Comments    Patient making progress with mobility and gait.  Agree with need for SNF at d/c for continued therapy.   Follow Up Recommendations  SNF;Supervision/Assistance - 24 hour     Equipment Recommendations  None recommended by PT    Recommendations for Other Services       Precautions / Restrictions Precautions Precautions: Knee;Fall Precaution Comments: Reviewed knee precautions Restrictions Weight Bearing Restrictions: Yes LLE Weight Bearing: Weight bearing as tolerated    Mobility  Bed Mobility Overal bed mobility: Needs Assistance Bed Mobility: Supine to Sit     Supine to sit: Min assist;HOB elevated     General bed mobility comments: Assist to don KI LLE.  Assist to bring LLE off of bed.  Transfers Overall transfer level: Needs assistance Equipment used: Rolling walker (2 wheeled) Transfers: Sit to/from [employer] Sit to Stand: Mod assist Stand pivot transfers: Mod assist       General transfer comment: Verbal cues for hand placement.  Assist to power up to stance from bed, recliner, and BSC.  Once upright, patient able to maintain standing balance with RW.  Mod assist to transfer recliner <> BSC - assist for balance/safety while taking pivot steps to transfer.  Ambulation/Gait Ambulation/Gait assistance: Mod assist Ambulation Distance (Feet): 10 Feet Assistive device: Rolling walker (2 wheeled) Gait Pattern/deviations: Step-to pattern;Decreased stance time - left;Decreased step length - right;Decreased stride length;Decreased weight shift to  left;Antalgic;Trunk flexed Gait velocity: decreased Gait velocity interpretation: Below normal speed for age/gender General Gait Details: Verbal cues for safe use of RW, and to stand upright.  Patient with slow, antalgic gait pattern, with decreased control of LLE in stance.   Stairs            Wheelchair Mobility    Modified Rankin (Stroke Patients Only)       Balance           Standing balance support: Bilateral upper extremity supported;During functional activity Standing balance-Leahy Scale: Poor Standing balance comment: Reliant on RW for stability                             Cognition Arousal/Alertness: Awake/alert Behavior During Therapy: WFL for tasks assessed/performed Overall Cognitive Status: Within Functional Limits for tasks assessed                                        Exercises Total Joint Exercises Ankle Circles/Pumps: AROM;Both;10 reps;Seated Quad Sets: AROM;Left;10 reps;Seated    General Comments        Pertinent Vitals/Pain Pain Assessment: 0-10 Pain Score: 9  (during gait) Pain Location: L knee Pain Descriptors / Indicators: Aching;Grimacing;Sore Pain Intervention(s): Limited activity within patient's tolerance;Monitored during session;Repositioned;Patient requesting pain meds-RN notified;RN gave pain meds during session    Home Living                      Prior Function  PT Goals (current goals can now be found in the care plan section) Acute Rehab PT Goals Patient Stated Goal: to go to rehab to get stronger Progress towards PT goals: Progressing toward goals    Frequency    7X/week      PT Plan Current plan remains appropriate    Co-evaluation              AM-PAC PT "6 Clicks" Daily Activity  Outcome Measure  Difficulty turning over in bed (including adjusting bedclothes, sheets and blankets)?: Total Difficulty moving from lying on back to sitting on the side of  the bed? : Total Difficulty sitting down on and standing up from a chair with arms (e.g., wheelchair, bedside commode, etc,.)?: Total Help needed moving to and from a bed to chair (including a wheelchair)?: A Lot Help needed walking in hospital room?: A Lot Help needed climbing 3-5 steps with a railing? : A Lot 6 Click Score: 9    End of Session Equipment Utilized During Treatment: Gait belt;Left knee immobilizer Activity Tolerance: Patient limited by pain Patient left: in chair;with call bell/phone within reach;with family/visitor present Nurse Communication: Mobility status;Patient requests pain meds PT Visit Diagnosis: Other abnormalities of gait and mobility (R26.89);Pain Pain - Right/Left: Left Pain - part of body: Knee     Time: [id number] PT Time Calculation (min) (ACUTE ONLY): 29 min  Charges:  $Gait Training: 8-22 mins $Therapeutic Activity: 8-22 mins                    G Codes:       Tamara [name], PT, [facility] Acute Rehab Services Pager [phone]    Vena AustriaSusan H Catha [name] 12/30/2016, 7:17 PM

## 2016-12-31 ENCOUNTER — Encounter (HOSPITAL_COMMUNITY): Payer: Self-pay | Admitting: *Deleted

## 2016-12-31 LAB — CBC
HEMATOCRIT: 32.9 % — AB (ref 36.0–46.0)
Hemoglobin: 10.3 g/dL — ABNORMAL LOW (ref 12.0–15.0)
MCH: 28.4 pg (ref 26.0–34.0)
MCHC: 31.3 g/dL (ref 30.0–36.0)
MCV: 90.6 fL (ref 78.0–100.0)
PLATELETS: 229 10*3/uL (ref 150–400)
RBC: 3.63 MIL/uL — AB (ref 3.87–5.11)
RDW: 14.3 % (ref 11.5–15.5)
WBC: 18.1 10*3/uL — AB (ref 4.0–10.5)

## 2016-12-31 LAB — GLUCOSE, CAPILLARY
GLUCOSE-CAPILLARY: 163 mg/dL — AB (ref 65–99)
GLUCOSE-CAPILLARY: 100 mg/dL — AB (ref 65–99)
Glucose-Capillary: 158 mg/dL — ABNORMAL HIGH (ref 65–99)
Glucose-Capillary: 87 mg/dL (ref 65–99)

## 2016-12-31 MED ORDER — SODIUM CHLORIDE 0.9% FLUSH: 3.0000 mL | INTRAVENOUS | Status: DC | PRN

## 2016-12-31 MED ORDER — SODIUM CHLORIDE 0.9% FLUSH
3.0000 mL | Freq: Two times a day (BID) | INTRAVENOUS | Status: DC
  Administered 2016-12-31 – 2017-01-01 (×2): 3 mL via INTRAVENOUS

## 2016-12-31 NOTE — Progress Notes (Signed)
Physical Therapy Treatment Patient Details Name: Tamara [name] MRN: [id number] DOB: 15-Dec-1940 Today's Date: 12/31/2016    History of Present Illness Pt is s/p elective L TKA secondary to L Knee OA. Pt also has wound to L flank under abdominal pannus. 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     PT Comments    Continuing work on functional mobility and activity tolerance;  Initiated session with therex, and noted overall better knee control with straight leg raises, therefore opted to walk without KI with close guard of L knee; minimal buckling noted L knee, and pt was still able to support herself on RW; cues to activate quad for more L stance stability   Follow Up Recommendations  SNF;Supervision/Assistance - 24 hour     Equipment Recommendations  None recommended by PT    Recommendations for Other Services       Precautions / Restrictions Precautions Precautions: Knee;Fall Precaution Comments: Reviewed knee precautions Restrictions LLE Weight Bearing: Weight bearing as tolerated    Mobility  Bed Mobility Overal bed mobility: Needs Assistance Bed Mobility: Supine to Sit     Supine to sit: Min guard     General bed mobility comments: Cues for technqiue  Transfers Overall transfer level: Needs assistance Equipment used: Rolling walker (2 wheeled) Transfers: Sit to/from Stand Sit to Stand: Mod assist         General transfer comment: Light mod assist to power up; Cues for hand placement  Ambulation/Gait Ambulation/Gait assistance: Mod assist Ambulation Distance (Feet): 8 Feet Assistive device: Rolling walker (2 wheeled) Gait Pattern/deviations: Step-to pattern;Decreased stance time - left;Decreased step length - right;Decreased stride length;Decreased weight shift to left;Antalgic;Trunk flexed Gait velocity: decreased   General Gait Details: Verbal cues for safe  use of RW, and to stand upright.  Patient with slow, antalgic gait pattern, with decreased control of LLE in stance; noted some LLE buckling in stance, minimal, but present, and requiring light mod assist to guard   Stairs            Wheelchair Mobility    Modified Rankin (Stroke Patients Only)       Balance     Sitting balance-Leahy Scale: Good       Standing balance-Leahy Scale: Poor                              Cognition Arousal/Alertness: Awake/alert Behavior During Therapy: WFL for tasks assessed/performed Overall Cognitive Status: Within Functional Limits for tasks assessed                                        Exercises Total Joint Exercises Ankle Circles/Pumps: AROM;Both;10 reps Quad Sets: AROM;Left;10 reps Short Arc Quad: AROM;Left;10 reps Heel Slides: AAROM;Left;10 reps Hip ABduction/ADduction: AROM;Left;10 reps;Supine Straight Leg Raises: AROM;Left;10 reps Goniometric ROM: approx 2-70 deg    General Comments        Pertinent Vitals/Pain Pain Assessment: Faces Pain Score: 2  Faces Pain Scale: Hurts little more Pain Location: L knee Pain Descriptors / Indicators: Aching;Grimacing;Sore Pain Intervention(s): Premedicated before session    Home Living                      Prior Function  PT Goals (current goals can now be found in the care plan section) Acute Rehab PT Goals Patient Stated Goal: to go to rehab to get stronger PT Goal Formulation: With patient Time For Goal Achievement: 01/05/17 Potential to Achieve Goals: Good Progress towards PT goals: Progressing toward goals    Frequency    7X/week      PT Plan Current plan remains appropriate    Co-evaluation              AM-PAC PT "6 Clicks" Daily Activity  Outcome Measure  Difficulty turning over in bed (including adjusting bedclothes, sheets and blankets)?: A Little Difficulty moving from lying on back to sitting on  the side of the bed? : A Lot Difficulty sitting down on and standing up from a chair with arms (e.g., wheelchair, bedside commode, etc,.)?: A Lot Help needed moving to and from a bed to chair (including a wheelchair)?: A Lot Help needed walking in hospital room?: A Little Help needed climbing 3-5 steps with a railing? : A Lot 6 Click Score: 14    End of Session Equipment Utilized During Treatment: Gait belt;Left knee immobilizer Activity Tolerance: Patient tolerated treatment well Patient left: in chair;with call bell/phone within reach;with family/visitor present Nurse Communication: Mobility status;Other (comment) (and to use KI when ambulating) PT Visit Diagnosis: Other abnormalities of gait and mobility (R26.89);Pain Pain - Right/Left: Left Pain - part of body: Knee     Time: [id number] PT Time Calculation (min) (ACUTE ONLY): 31 min  Charges:  $Gait Training: 8-22 mins $Therapeutic Exercise: 8-22 mins                    G Codes:       Van ClinesHolly Shanna Strength, PT  Acute Rehabilitation Services Pager [phone] Office [phone]    Levi AlandHolly H Nami Strawder 12/31/2016, 5:22 PM

## 2016-12-31 NOTE — Progress Notes (Signed)
SPORTS MEDICINE AND JOINT REPLACEMENT  Georgena SpurlingStephen Lucey, MD    Laurier Nancyolby Doig, PA-C [address], [place], [place]  [id number]                             [phone]   PROGRESS NOTE  Subjective:  negative for Chest Pain  negative for Shortness of Breath  positive for Nausea/Vomiting   negative for Calf Pain  negative for Bowel Movement   Tolerating Diet: yes         Patient reports pain as 4 on 0-10 scale.    Objective: Vital signs in last 24 hours:   Patient Vitals for the past 24 hrs:  BP Temp Temp src Pulse Resp SpO2  12/31/16 0637 (!) 100/59 - - - - -  12/31/16 0630 (!) 107/58 - - - - -  12/31/16 0623 (!) 90/27 98.6 F (37 C) Oral (!) 107 - 90 %  12/30/16 2112 (!) 100/55 99.6 F (37.6 C) Oral (!) 105 - 93 %  12/30/16 1318 (!) 148/36 98.9 F (37.2 C) Axillary 87 16 98 %    @flow {1959:LAST@   Intake/Output from previous day:   05/05 0701 - 05/06 0700 In: 1455 [P.O.:480; I.V.:975] Out: 350 [Urine:350]   Intake/Output this shift:   No intake/output data recorded.   Intake/Output      05/05 0701 - 05/06 0700 05/06 0701 - 05/07 0700   P.O. 480    I.V. (mL/kg) 975 (8.3)    IV Piggyback     Total Intake(mL/kg) 1455 (12.4)    Urine (mL/kg/hr) 350 (0.1)    Blood     Total Output 350     Net +1105             LABORATORY DATA:  Recent Labs  12/30/16 0647 12/31/16 0232  WBC 12.1* 18.1*  HGB 10.8* 10.3*  HCT 34.3* 32.9*  PLT 246 229    Recent Labs  12/30/16 0647  NA 132*  K 4.0  CL 98*  CO2 26  BUN 13  CREATININE 0.92  GLUCOSE 162*  CALCIUM 8.3*   Lab Results  Component Value Date   INR 1.11 12/19/2016   INR 1.48 05/18/2016   INR 2.06 05/16/2016    Examination:  General appearance: alert, cooperative and no distress Extremities: extremities normal, atraumatic, no cyanosis or edema  Wound Exam: clean, dry, intact   Drainage:  None: wound tissue dry  Motor Exam: Quadriceps and Hamstrings Intact  Sensory Exam: Superficial  Peroneal, Deep Peroneal and Tibial normal   Assessment:    2 Days Post-Op  Procedure(s) (LRB): TOTAL KNEE ARTHROPLASTY (Left)  ADDITIONAL DIAGNOSIS:  Principal Problem:   Primary localized osteoarthritis of left knee Active Problems:   Paroxysmal SVT (supraventricular tachycardia) (HCC)   Hypothyroidism   Diabetes mellitus (HCC)   CHF with left ventricular diastolic dysfunction, NYHA class 2 (HCC)   Yeast infection of the skin   Hypertension   Obesity, Class III, BMI 40-49.9 (morbid obesity) (HCC)     Plan: Physical Therapy as ordered Weight Bearing as Tolerated (WBAT)  DVT Prophylaxis:  Eloquis  DISCHARGE PLAN: Home  DISCHARGE NEEDS: HHPT   Patient still doing well but is nauseous this morning. Will order phenergan and see how she progresses in PT with expected D/C to SNF tomorrow.          Guy SandiferColby Alan Dauria 12/31/2016, 9:28 AM

## 2016-12-31 NOTE — Clinical Social Work Note (Signed)
Clinical Social Work Assessment  Patient Details  Name: Tamara [name] MRN: [id number] Date of Birth: 13-Mar-1941  Date of referral:  12/31/16               Reason for consult:  Discharge Planning                Permission sought to share information with:  Family Supports Permission granted to share information::  Yes, Verbal Permission Granted  Name::     Wilna Pennie  Agency::     Relationship::  daughter  Contact Information:  [phone]  Housing/Transportation Living arrangements for the past 2 months:  Single Family Home Source of Information:  Patient Patient Interpreter Needed:  None Criminal Activity/Legal Involvement Pertinent to Current Situation/Hospitalization:  No - Comment as needed Significant Relationships:  Adult Children, Siblings Lives with:  Adult Children Do you feel safe going back to the place where you live?  Yes Need for family participation in patient care:  Yes (Comment)  Care giving concerns:  Patient lives at home with son but son works long hours and would not be able to provide 24 hr care. Patient had a lot of family at bedside and has huge amount of support    Facilities manager / plan:  Holiday representative met patient and pt's sister at bedside to offer support and discuss patients discharge options. Patient stated she is agreeable to discharge to SNF and would prefer [facility] since she has been their in the past. Patient stated that she lives with her son but due to his long work hours he wont be able to be home during the day. CSW to complete necessary paperwork and initiate SNF search on patient behalf.   Employment status:  Retired Forensic scientist:  Medicare PT Recommendations:  [place] / Referral to community resources:  [place]  Patient/Family's Response to care:  Patient verbalized appreciation and understanding for CSW role and involvement in care. Patient agreeable with  current discharge plan to SNF  Patient/Family's Understanding of and Emotional Response to Diagnosis, Current Treatment, and Prognosis:  Patient with good understanding of current medical state and limitations around recent hospitalization  Emotional Assessment Appearance:  Appears stated age Attitude/Demeanor/Rapport:  Other Affect (typically observed):  Pleasant Orientation:  Oriented to Situation, Oriented to  Time, Oriented to Place, Oriented to Self Alcohol / Substance use:  Not Applicable Psych involvement (Current and /or in the community):  No (Comment)  Discharge Needs  Concerns to be addressed:  No discharge needs identified Readmission within the last 30 days:  No Current discharge risk:  None Barriers to Discharge:  No Barriers Identified   Wende Neighbors, [employer] 12/31/2016, 1:07 PM

## 2016-12-31 NOTE — Clinical Social Work Placement (Addendum)
   CLINICAL SOCIAL WORK PLACEMENT  NOTE  Date:  12/31/2016  Patient Details  Name: Tamara [name] MRN: [id number] Date of Birth: 1940/11/20  Clinical Social Work is seeking post-discharge placement for this patient at the Skilled  Nursing Facility level of care (*CSW will initial, date and re-position this form in  chart as items are completed):  Yes   Patient/family provided with [facility] Clinical Social Work Department's list of facilities offering this level of care within the geographic area requested by the patient (or if unable, by the patient's family).  Yes   Patient/family informed of their freedom to choose among providers that offer the needed level of care, that participate in Medicare, Medicaid or managed care program needed by the patient, have an available bed and are willing to accept the patient.  Yes   Patient/family informed of [facility]'s ownership interest in [facility] and [facility], as well as of the fact that they are under no obligation to receive care at these facilities.  PASRR submitted to EDS on       PASRR number received on       Existing PASRR number confirmed on 12/31/16     FL2 transmitted to all facilities in geographic area requested by pt/family on       FL2 transmitted to all facilities within larger geographic area on       Patient informed that his/her managed care company has contracts with or will negotiate with certain facilities, including the following:            Patient/family informed of bed offers received.  Patient chooses bed at   [facility].    Physician recommends and patient chooses bed at      Patient to be transferred to  [facility] on  01/01/17.  [facility]     Patient family notified on   01/01/17 of transfer.  Name of family member notified:    family at bedside.    PHYSICIAN Please prepare priority discharge summary, including medications, Please prepare  prescriptions, Please sign FL2     Additional Comment:    _______________________________________________ Althea CharonAshley C Woods, LCSW 12/31/2016, 1:16 PM

## 2016-12-31 NOTE — [place] FL2 (Signed)
[place] MEDICAID FL2 LEVEL OF CARE SCREENING TOOL     IDENTIFICATION  Patient Name: Tamara [name] Birthdate: 1940/09/11 Sex: female Admission Date (Current Location): 12/29/2016  [facility] [place]   Facility and Address:  [facility] [place], [address], [place], [place] [address]      Provider Number: [id number]  Attending Physician Name and Address:  Frederico Hammanaffrey, Daniel, MD  Relative Name and Phone Number:  Arrie AranMelissa Hammitt, [phone]    Current Level of Care: Hospital Recommended Level of Care: Skilled Nursing Facility Prior Approval Number:    Date Approved/Denied:   PASRR Number: [id number]  Discharge Plan: SNF    Current Diagnoses: Patient Active Problem List   Diagnosis Date Noted  . Obesity, Class III, BMI 40-49.9 (morbid obesity) (HCC) 12/30/2016  . Primary localized osteoarthritis of left knee 12/29/2016  . Hypothyroidism 12/29/2016  . Diabetes mellitus (HCC) 12/29/2016  . CHF with left ventricular diastolic dysfunction, NYHA class 2 (HCC) 12/29/2016  . Yeast infection of the skin 12/29/2016  . Hypertension 12/29/2016  . Primary localized osteoarthritis of right knee 05/12/2016  . Paroxysmal SVT (supraventricular tachycardia) (HCC) 03/01/2016  . Preprocedural cardiovascular examination 03/01/2016    Orientation RESPIRATION BLADDER Height & Weight     Self, Time, Situation, Place  Normal Continent Weight: 259 lb (117.5 kg) Height:     BEHAVIORAL SYMPTOMS/MOOD NEUROLOGICAL BOWEL NUTRITION STATUS      Continent Diet (carb modified fluid consistency )  AMBULATORY STATUS COMMUNICATION OF NEEDS Skin                               Personal Care Assistance Level of Assistance              Functional Limitations Info             SPECIAL CARE FACTORS FREQUENCY  PT (By licensed PT), OT (By licensed OT)     PT Frequency: 5x wk OT Frequency: 5x wk            Contractures      Additional  Factors Info  Code Status, Allergies Code Status Info: Full Code Allergies Info: Codeine           Current Medications (12/31/2016):  This is the current hospital active medication list Current Facility-Administered Medications  Medication Dose Route Frequency Provider Last Rate Last Dose  . 0.9 %  sodium chloride infusion   Intravenous Continuous Margart SicklesChadwell, Joshua, PA-C 75 mL/hr at 12/30/16 0217    . alum & mag hydroxide-simeth (MAALOX/MYLANTA) 200-200-20 MG/5ML suspension 30 mL  30 mL Oral Q4H PRN Chadwell, Joshua, PA-C      . apixaban (ELIQUIS) tablet 2.5 mg  2.5 mg Oral Q12H Chadwell, Joshua, PA-C   2.5 mg at 12/31/16 0900  . carvedilol (COREG) tablet 3.125 mg  3.125 mg Oral BID WC Kyazimova, Marina S, PA-C   3.125 mg at 12/31/16 0900  . fluconazole (DIFLUCAN) tablet 100 mg  100 mg Oral Daily Raymon MuttonKyazimova, Marina S, PA-C   100 mg at 12/31/16 1059  . fluticasone (FLONASE) 50 MCG/ACT nasal spray 1 spray  1 spray Each Nare BID PRN Chadwell, Joshua, PA-C      . HYDROcodone-acetaminophen (NORCO/VICODIN) 5-325 MG per tablet 1-2 tablet  1-2 tablet Oral Q4H PRN Chadwell, Joshua, PA-C   2 tablet at 12/31/16 1058  . HYDROmorphone (DILAUDID) injection 0.5 mg  0.5 mg Intravenous Q2H PRN Margart Sickleshadwell, Joshua,  PA-C      . insulin aspart (novoLOG) injection 0-20 Units  0-20 Units Subcutaneous TID WC Chadwell, Joshua, PA-C   4 Units at 12/31/16 0800  . insulin aspart (novoLOG) injection 0-5 Units  0-5 Units Subcutaneous QHS Chadwell, Joshua, PA-C      . insulin aspart (novoLOG) injection 6 Units  6 Units Subcutaneous TID WC Chadwell, Joshua, PA-C   6 Units at 12/31/16 0830  . levothyroxine (SYNTHROID, LEVOTHROID) tablet 150 mcg  150 mcg Oral QAC breakfast Chadwell, Joshua, PA-C   150 mcg at 12/31/16 0900  . menthol-cetylpyridinium (CEPACOL) lozenge 3 mg  1 lozenge Oral PRN Chadwell, Joshua, PA-C       Or  . phenol (CHLORASEPTIC) mouth spray 1 spray  1 spray Mouth/Throat PRN Chadwell, Joshua, PA-C      .  metoCLOPramide (REGLAN) tablet 5-10 mg  5-10 mg Oral Q8H PRN Chadwell, Joshua, PA-C       Or  . metoCLOPramide (REGLAN) injection 5-10 mg  5-10 mg Intravenous Q8H PRN Chadwell, Joshua, PA-C   10 mg at 12/29/16 1514  . miconazole (MICOTIN) 2 % cream   Topical BID Chadwell, Joshua, PA-C      . nitroGLYCERIN (NITROSTAT) SL tablet 0.4 mg  0.4 mg Sublingual Q5 min PRN Chadwell, Joshua, PA-C      . nystatin (MYCOSTATIN/NYSTOP) topical powder   Topical TID Kyazimova, Marina S, PA-C      . promethazine (PHENERGAN) tablet 12.5 mg  12.5 mg Oral Q6H PRN Chadwell, Joshua, PA-C   12.5 mg at 12/31/16 1100  . traMADol (ULTRAM) tablet 100 mg  100 mg Oral Q6H PRN Chadwell, Joshua, PA-C   100 mg at 12/31/16 1610     Discharge Medications: Please see discharge summary for a list of discharge medications.  Relevant Imaging Results:  Relevant Lab Results:   Additional Information SS#[id number]  Althea Charon, LCSW

## 2016-12-31 NOTE — Progress Notes (Signed)
Progress Note    Tamara [name]  ZOX:[id number] DOB: 07/08/1941  DOA: 12/29/2016 PCP: Alan Mulder, MD    Brief Narrative:   Reason for consult: Follow-up Candida infection  Medical records reviewed and are as summarized below:  Tamara [name] is an 76 y.o. female with a PMH of diabetes, hypothyroidism, chronic diastolic CHF, hypertension, CAD status post non-STEMI in September 2017 who underwent a left total knee arthroplasty 12/29/16 and was noted to have a rash on the left side of her groin for which internal medicine was consulted.  Assessment/Plan:   Principal Problem:   Primary localized osteoarthritis of left knee Status post left TKA 12/29/16. Management per orthopedic surgery.  Active Problems:   Paroxysmal SVT (supraventricular tachycardia) (HCC) Continue Coreg.     Hypothyroidism Continue Synthroid.    Diabetes mellitus (HCC) Currently being managed with insulin resistant SSI Q AC/HS as well as 6 units of meal coverage. CBGs 95-161.    CHF with left ventricular diastolic dysfunction, NYHA class 2 (HCC) Monitor for signs of decompensation.    Yeast infection of the skin Continue Diflucan and nystatin powder to affected area.    Hypertension Continue Coreg, blood pressure controlled.    Morbid obesity Body mass index is 46.62 kg/m.   Family Communication/Anticipated D/C date and plan/Code Status   DVT prophylaxis: Eliquis ordered. Code Status: Full Code.  Family Communication: No family at bedside. Disposition Plan: Per primary team   Subjective:   Left knee/leg pain.  No dyspnea, chest pain.    Objective:    Vitals:   12/30/16 2112 12/31/16 0623 12/31/16 0630 12/31/16 0637  BP: (!) 100/55 (!) 90/27 (!) 107/58 (!) 100/59  Pulse: (!) 105 (!) 107    Resp:      Temp: 99.6 F (37.6 C) 98.6 F (37 C)    TempSrc: Oral Oral    SpO2: 93% 90%    Weight:        Intake/Output Summary (Last 24 hours) at 12/31/16 0900 Last data filed at  12/30/16 1700  Gross per 24 hour  Intake              240 ml  Output              350 ml  Net             -110 ml   Filed Weights   12/29/16 0550  Weight: 117.5 kg (259 lb)    Exam: General exam: Appears alert and comfortable today. Respiratory system: Clear to auscultation. Respiratory effort normal. Cardiovascular system: S1 & S2 heard, RRR. No JVD,  rubs, gallops or clicks. No murmurs. Gastrointestinal system: Abdomen is nondistended, soft and nontender. No organomegaly or masses felt. Normal bowel sounds heard. Central nervous system: Alert & oriented. No focal neurological deficits. Extremities: Left leg in ACE wrap thigh to knee. Skin: Warm and dry, left groin has InterDry bandage in place. Psychiatry: Judgement and insight appear normal. Mood & affect flat.   Data Reviewed:   I have personally reviewed following labs and imaging studies:  Labs: Basic Metabolic Panel:  Recent Labs Lab 12/30/16 0647  NA 132*  K 4.0  CL 98*  CO2 26  GLUCOSE 162*  BUN 13  CREATININE 0.92  CALCIUM 8.3*   GFR Estimated Creatinine Clearance: 64.9 mL/min (by C-G formula based on SCr of 0.92 mg/dL). Liver Function Tests: No results for input(s): AST, ALT, ALKPHOS, BILITOT, PROT, ALBUMIN in the last 168 hours.  No results for input(s): LIPASE, AMYLASE in the last 168 hours. No results for input(s): AMMONIA in the last 168 hours. Coagulation profile No results for input(s): INR, PROTIME in the last 168 hours.  CBC:  Recent Labs Lab 12/30/16 0647 12/31/16 0232  WBC 12.1* 18.1*  HGB 10.8* 10.3*  HCT 34.3* 32.9*  MCV 90.3 90.6  PLT 246 229   Cardiac Enzymes: No results for input(s): CKTOTAL, CKMB, CKMBINDEX, TROPONINI in the last 168 hours. BNP (last 3 results) No results for input(s): PROBNP in the last 8760 hours. CBG:  Recent Labs Lab 12/30/16 0621 12/30/16 1219 12/30/16 1626 12/30/16 2118 12/31/16 0627  GLUCAP 161* 129* 155* 95 158*    Microbiology No results  found for this or any previous visit (from the past 240 hour(s)).  Radiology: No results found.  Medications:   . apixaban  2.5 mg Oral Q12H  . carvedilol  3.125 mg Oral BID WC  . fluconazole  100 mg Oral Daily  . insulin aspart  0-20 Units Subcutaneous TID WC  . insulin aspart  0-5 Units Subcutaneous QHS  . insulin aspart  6 Units Subcutaneous TID WC  . levothyroxine  150 mcg Oral QAC breakfast  . miconazole   Topical BID  . nystatin   Topical TID   Continuous Infusions: . sodium chloride 75 mL/hr at 12/30/16 0217    Medical decision making is of low complexity and therefore this is a level 1 visit. (> 2 problem points, >2 data points, low risk: Need 2 out of 3)   LOS: 2 days   Carlisha Wisler  [facility] Pager [phone]. If unable to reach me by pager, please call my cell phone at [phone].  *Please refer to [email or web address], password [email or web address] to get updated schedule on who will round on this patient, as hospitalists switch teams weekly. If 7PM-7AM, please contact night-coverage at [email or web address], password [email or web address] for any overnight needs.  12/31/2016, 9:00 AM

## 2017-01-01 ENCOUNTER — Encounter (HOSPITAL_COMMUNITY): Payer: Self-pay | Admitting: Orthopedic Surgery

## 2017-01-01 LAB — CBC
HCT: 30.2 % — ABNORMAL LOW (ref 36.0–46.0)
Hemoglobin: 9.5 g/dL — ABNORMAL LOW (ref 12.0–15.0)
MCH: 28.2 pg (ref 26.0–34.0)
MCHC: 31.5 g/dL (ref 30.0–36.0)
MCV: 89.6 fL (ref 78.0–100.0)
PLATELETS: 216 10*3/uL (ref 150–400)
RBC: 3.37 MIL/uL — AB (ref 3.87–5.11)
RDW: 14.4 % (ref 11.5–15.5)
WBC: 12.4 10*3/uL — AB (ref 4.0–10.5)

## 2017-01-01 LAB — GLUCOSE, CAPILLARY: Glucose-Capillary: 128 mg/dL — ABNORMAL HIGH (ref 65–99)

## 2017-01-01 MED ORDER — FLUCONAZOLE 100 MG PO TABS: 100.0000 mg | ORAL_TABLET | Freq: Every day | ORAL | 0 refills | Status: DC

## 2017-01-01 MED ORDER — NYSTATIN 100000 UNIT/GM EX POWD: Freq: Three times a day (TID) | CUTANEOUS | 0 refills | Status: DC

## 2017-01-01 NOTE — Social Work (Signed)
Clinical Social Worker facilitated patient discharge including contacting patient family and facility to confirm patient discharge plans.  Clinical information faxed to facility and family agreeable with plan.  CSW arranged ambulance transport via [facility] to [facility] .  RN to call [phone],nurse Elly ModenaKathy Rogers, to report prior to discharge.  Clinical Social Worker will sign off for now as social work intervention is no longer needed. Please consult us again if new need arises.  Keene BreathPatricia Man Effertz, LCSW Clinical Social Worker [phone]

## 2017-01-01 NOTE — Progress Notes (Signed)
Patient is discharged from room 5N21 at this time. Alert and in stable condition. IV site d/c'd. Report called in to receiving nurse Yolanda MangesKin Hicks, L[facility] at [facility] with all questions answered. Left unit via stretcher by [facility] with all belongings and daughters at side.

## 2017-01-01 NOTE — Progress Notes (Signed)
Occupational Therapy Treatment Patient Details Name: Tamara [name] MRN: [id number] DOB: 1941-08-12 Today's Date: 01/01/2017    History of present illness Pt is s/p elective L TKA secondary to L Knee OA. Pt also has wound to L flank under abdominal pannus. 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    OT comments  Pt performed bathing, dressing, toileting and grooming from sitting position. Pt progressing well in mobility and activity tolerance. Anticipate pt will do well in rehab.  Follow Up Recommendations  SNF;Supervision/Assistance - 24 hour    Equipment Recommendations       Recommendations for Other Services      Precautions / Restrictions Precautions Precautions: Knee;Fall Restrictions Weight Bearing Restrictions: Yes LLE Weight Bearing: Weight bearing as tolerated       Mobility Bed Mobility Overal bed mobility: Needs Assistance Bed Mobility: Supine to Sit     Supine to sit: Supervision     General bed mobility comments: increased time, use of rail  Transfers   Equipment used: Rolling walker (2 wheeled) Transfers: Sit to/from UGI CorporationStand;Stand Pivot Transfers Sit to Stand: Min guard Stand pivot transfers: Min guard       General transfer comment: increased time, heightened bed    Balance Overall balance assessment: Needs assistance   Sitting balance-Leahy Scale: Fair     Standing balance support: Bilateral upper extremity supported;During functional activity Standing balance-Leahy Scale: Poor Standing balance comment: Reliant on RW for stability                            ADL either performed or assessed with clinical judgement   ADL Overall ADL's : Needs assistance/impaired     Grooming: Set up;Sitting;Wash/dry hands;Wash/dry face;Oral care   Upper Body Bathing: Minimal assistance;Sitting Upper Body Bathing Details (indicate cue type and  reason): assisted with back Lower Body Bathing: Maximal assistance;Sit to/from stand   Upper Body Dressing : Supervision/safety;Sitting;Set up   Lower Body Dressing: Maximal assistance;Sit to/from stand   Toilet Transfer: Min guard;Stand-pivot;RW;BSC   Toileting- ArchitectClothing Manipulation and Hygiene: Maximal assistance;Sit to/from stand               Vision       Perception     Praxis      Cognition Arousal/Alertness: Awake/alert Behavior During Therapy: WFL for tasks assessed/performed Overall Cognitive Status: Within Functional Limits for tasks assessed                                          Exercises     Shoulder Instructions       General Comments      Pertinent Vitals/ Pain       Pain Assessment: Faces Faces Pain Scale: Hurts little more Pain Location: L knee Pain Descriptors / Indicators: Aching;Grimacing;Sore Pain Intervention(s): Monitored during session;Repositioned;Ice applied  Home Living                                          Prior Functioning/Environment              Frequency  Min 2X/week        Progress Toward Goals  OT Goals(current goals can now be  found in the care plan section)  Progress towards OT goals: Progressing toward goals  Acute Rehab OT Goals Patient Stated Goal: to go to rehab to get stronger OT Goal Formulation: With patient Time For Goal Achievement: 01/13/17 Potential to Achieve Goals: Good  Plan Discharge plan remains appropriate    Co-evaluation                 AM-PAC PT "6 Clicks" Daily Activity     Outcome Measure   Help from another person eating meals?: None Help from another person taking care of personal grooming?: None Help from another person toileting, which includes using toliet, bedpan, or urinal?: A Lot Help from another person bathing (including washing, rinsing, drying)?: A Lot Help from another person to put on and taking off regular upper  body clothing?: None Help from another person to put on and taking off regular lower body clothing?: A Lot 6 Click Score: 18    End of Session Equipment Utilized During Treatment: Gait belt;Rolling walker  OT Visit Diagnosis: Other abnormalities of gait and mobility (R26.89);Pain Pain - Right/Left: Left Pain - part of body: Knee   Activity Tolerance Patient tolerated treatment well   Patient Left in chair;with call bell/phone within reach;with nursing/sitter in room   Nurse Communication  (aware pt has been bathed)        Time: 1610-9604 OT Time Calculation (min): 36 min  Charges: OT General Charges $OT Visit: 1 Procedure OT Treatments $Self Care/Home Management : 23-37 mins     Evern Bio 01/01/2017, 9:48 AM  [phone]

## 2017-01-01 NOTE — Discharge Summary (Signed)
PATIENT ID: Tamara [name]        MRN:  [id number]          DOB/AGE: March 30, 1941 / 76 y.o.    DISCHARGE SUMMARY  ADMISSION DATE:    12/29/2016 DISCHARGE DATE:   01/01/2017   ADMISSION DIAGNOSIS: OA LEFT KNEE    DISCHARGE DIAGNOSIS:  OA LEFT KNEE    ADDITIONAL DIAGNOSIS: Principal Problem:   Primary localized osteoarthritis of left knee Active Problems:   Paroxysmal SVT (supraventricular tachycardia) (HCC)   Hypothyroidism   Diabetes mellitus (HCC)   CHF with left ventricular diastolic dysfunction, NYHA class 2 (HCC)   Yeast infection of the skin   Hypertension   Obesity, Class III, BMI 40-49.9 (morbid obesity) (HCC)  Past Medical History:  Diagnosis Date  . Anemia    distant hx of  . Arthritis    KNEES,HANDS,WRIST  . Asthma    allergy related  . 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 Bronchitis   . Cardiomegaly   . CHF (congestive heart failure) (HCC)    stopped using diuretics due to kidney issues, watching salt intake  . Chronic cough   . Chronic kidney disease    due to DM  . Complication of anesthesia    BP bottomed out during Achilles Tendon repair >62yrs ago  . Constipation due to pain medication   . Diabetes mellitus without complication (HCC)    type 2  not on medications  . Diverticulitis   . Dysrhythmia   . Gout   . HAP (hospital-acquired pneumonia) 05/16/2016  . Headache    Sinus headaches  . 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 History of blood transfusion   . Hypertension   . Hypothyroidism   . 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 NSTEMI (non-ST elevated myocardial infarction) (HCC)   . Obesity   . Palpitations   . Pneumonia    hx of- 05/02/16- last time approx - 6 years ago  . Sepsis associated hypotension (HCC) 05/16/2016  . Shortness of breath dyspnea    with exertion  . Sleep apnea    does not use CPAP  . SVT (supraventricular tachycardia) (HCC)   . Wheezing     PROCEDURE:  Procedure(s): TOTAL KNEE ARTHROPLASTY Left on 12/29/2016  CONSULTS: PT/OT/SW Treatment Team:  Mahala Menghini, MD   HISTORY:  See H&P in chart  HOSPITAL COURSE:  MERILYNN HAYDU is a 76 y.o. admitted on 12/29/2016 and found to have a diagnosis of OA LEFT KNEE.  After appropriate laboratory studies were obtained  they were taken to the operating room on 12/29/2016 and underwent  Procedure(s): TOTAL KNEE ARTHROPLASTY  Left.   They were given perioperative antibiotics:  Anti-infectives    Start     Dose/Rate Route Frequency Ordered Stop   01/01/17 0000  fluconazole (DIFLUCAN) 100 MG tablet     100 mg Oral Daily 01/01/17 0839     12/29/16 1400  fluconazole (DIFLUCAN) tablet 100 mg     100 mg Oral Daily 12/29/16 1242     12/29/16 1330  ceFAZolin (ANCEF) IVPB 2g/100 mL premix     2 g 200 mL/hr over 30 Minutes Intravenous Every 6 hours 12/29/16 1116 12/29/16 2117   12/29/16 0700  ceFAZolin (ANCEF) 3 g in dextrose 5 % 50 mL IVPB     3 g 130 mL/hr over 30 Minutes Intravenous To [employer]  Surgical 12/28/16 1327 12/29/16 1156    .  Tolerated the procedure well.  Placed with a foley intraoperatively.      POD #1, allowed out of bed to a chair.  PT for ambulation and exercise program.  Foley D/C'd in morning.  IV saline locked.  O2 discontionued.  POD #2, continued PT and ambulation.   The remainder of the hospital course was dedicated to ambulation and strengthening.   The patient was discharged on 3 Days Post-Op in  Stable condition.  Blood products given:none  DIAGNOSTIC STUDIES: Recent vital signs: Patient Vitals for the past 24 hrs:  BP Temp Temp src Pulse Resp SpO2  01/01/17 0623 (!) 90/53 98.4 F (36.9 C) Oral 91 16 94 %  12/31/16 2102 (!) 103/59 98.4 F (36.9 C) Oral 93 16 95 %  12/31/16 1535 (!) 98/58 - - - - -  12/31/16 1527 (!) 85/50 98.6 F (37 C) Oral 95 16 92 %       Recent laboratory studies:  Recent Labs  12/30/16 0647 12/31/16 0232 01/01/17 0323  WBC 12.1* 18.1*  12.4*  HGB 10.8* 10.3* 9.5*  HCT 34.3* 32.9* 30.2*  PLT 246 229 216    Recent Labs  12/30/16 0647  NA 132*  K 4.0  CL 98*  CO2 26  BUN 13  CREATININE 0.92  GLUCOSE 162*  CALCIUM 8.3*   Lab Results  Component Value Date   INR 1.11 12/19/2016   INR 1.48 05/18/2016   INR 2.06 05/16/2016     Recent Radiographic Studies :  Dg Chest 2 View  Result Date: 12/19/2016 CLINICAL DATA:  Preop evaluation for upcoming knee replacement, cough EXAM: CHEST  2 VIEW COMPARISON:  05/15/2016 FINDINGS: Cardiac shadow is within normal limits. Some scarring is noted in the lungs bilaterally stable from previous exam. No acute infiltrate or sizable effusion is noted. Mild hyperinflation is seen. Degenerative change of the thoracic spine is noted. IMPRESSION: Chronic scarring without acute abnormality. Electronically Signed   By: Alcide CleverMark  Lukens M.D.   On: 12/19/2016 13:43    DISCHARGE INSTRUCTIONS:   DISCHARGE MEDICATIONS:   Allergies as of 01/01/2017      Reactions   Codeine Nausea And Vomiting      Medication List    STOP taking these medications   levofloxacin 500 MG tablet Commonly known as:  LEVAQUIN   ondansetron 4 MG tablet Commonly known as:  ZOFRAN   oxyCODONE 5 MG immediate release tablet Commonly known as:  ROXICODONE     TAKE these medications   acetaminophen 500 MG tablet Commonly known as:  TYLENOL Take 1,000 mg by mouth every 8 (eight) hours as needed for mild pain.   apixaban 2.5 MG Tabs tablet Commonly known as:  ELIQUIS Take 1 tablet (2.5 mg total) by mouth 2 (two) times daily.   carvedilol 3.125 MG tablet Commonly known as:  COREG Take 3.125 mg by mouth 2 (two) times daily with a meal.   fluconazole 100 MG tablet Commonly known as:  DIFLUCAN Take 1 tablet (100 mg total) by mouth daily.   fluticasone 50 MCG/ACT nasal spray Commonly known as:  FLONASE Place 1 spray into both nostrils 2 (two) times daily as needed for allergies or rhinitis.   HAIR/SKIN/NAILS  PO Take 2 tablets by mouth daily.   HYDROcodone-acetaminophen 5-325 MG tablet Commonly known as:  NORCO Take 1-2 tablets by mouth every 4 (four) hours as needed for moderate pain or severe pain. What changed:  how  much to take   levothyroxine 150 MCG tablet Commonly known as:  SYNTHROID, LEVOTHROID Take 150 mcg by mouth daily before breakfast.   nitroGLYCERIN 0.4 MG SL tablet Commonly known as:  NITROSTAT Place 0.4 mg under the tongue every 5 (five) minutes as needed for chest pain. Max 3   nystatin powder Commonly known as:  MYCOSTATIN/NYSTOP Apply topically 3 (three) times daily.   ONE TOUCH ULTRA TEST test strip Generic drug:  glucose blood Apply 1 strip topically as directed.   traMADol 50 MG tablet Commonly known as:  ULTRAM Take 2 tablets (100 mg total) by mouth every 6 (six) hours as needed. What changed:  when to take this  reasons to take this   VITAMIN E PO Take 450 mg by mouth daily.            Durable Medical Equipment        Start     Ordered   12/29/16 1117  DME Walker rolling  Once    Question:  Patient needs a walker to treat with the following condition  Answer:  Primary localized osteoarthritis of left knee   12/29/16 1116      FOLLOW UP VISIT:   Follow-up Information    Frederico Hamman, MD. Schedule an appointment as soon as possible for a visit in 2 weeks.   Specialty:  Orthopedic Surgery Contact information: [address] [place] [address] [phone]           DISPOSITION:   Skilled Nursing Facility/Rehab  CONDITION:  Stable   Margart Sickles, PA-C  01/01/2017 8:40 AM

## 2017-01-03 LAB — TYPE AND SCREEN
ABO/RH(D): A POS
ANTIBODY SCREEN: POSITIVE
DAT, IgG: NEGATIVE
DONOR AG TYPE: NEGATIVE
Donor AG Type: NEGATIVE
PT AG Type: NEGATIVE
UNIT DIVISION: 0
Unit division: 0

## 2017-01-03 LAB — BPAM RBC
Blood Product Expiration Date: 201805222359
Blood Product Expiration Date: 201805222359
UNIT TYPE AND RH: 6200
Unit Type and Rh: 6200

## 2017-01-24 ENCOUNTER — Other Ambulatory Visit (HOSPITAL_COMMUNITY): Payer: Self-pay | Admitting: Orthopedic Surgery

## 2017-01-24 ENCOUNTER — Ambulatory Visit (HOSPITAL_COMMUNITY)
Admission: RE | Admit: 2017-01-24 | Discharge: 2017-01-24 | Disposition: A | Discharge status: Home / Self Care | Payer: Medicare Other | Source: Ambulatory Visit | Attending: Endocrinology | Admitting: Endocrinology

## 2017-01-24 DIAGNOSIS — M7989 Other specified soft tissue disorders: Secondary | ICD-10-CM | POA: Diagnosis not present

## 2017-01-24 DIAGNOSIS — M79605 Pain in left leg: Secondary | ICD-10-CM | POA: Diagnosis not present

## 2017-01-24 NOTE — Progress Notes (Signed)
*  PRELIMINARY RESULTS* Vascular Ultrasound Left lower extremity venous duplex has been completed.  Preliminary findings: No evidence of deep vein thrombosis in the visualized veins of the left lower extremity.  Negative for baker's cyst.  Mild/moderate interstitial fluid noted in posterior calf, area of complaint.  Preliinary results called to Dr. Madelon Lipsaffrey @ 14:30.   Tamara [name] 01/24/2017, 2:31 PM

## 2017-02-08 IMAGING — NM NM MISC PROCEDURE
6 series · 36 of 36 positions shown · non-contrast
Comparison: none

[Series 1: wbr stress-gsp · 6.40mm/px · 6 of 479 frames shown]
[frame 40/479  full-range]
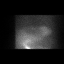
[frame 120/479  full-range]
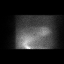
[frame 200/479  full-range]
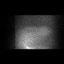
[frame 280/479  full-range]
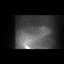
[frame 360/479  full-range]
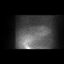
[frame 440/479  full-range]
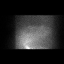

[Series 1: wbr_s-proj_st wbr stress-gsp · 6.40mm/px · 6 of 512 frames shown]
[frame 43/512]
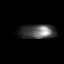
[frame 128/512]
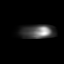
[frame 214/512]
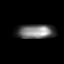
[frame 299/512]
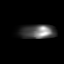
[frame 384/512]
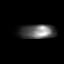
[frame 470/512]
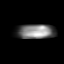

[Series 2: wbr_s-proj_st wbr stress-sum-em · 6.40mm/px · 6 of 64 frames shown]
[frame 6/64]
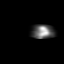
[frame 16/64]
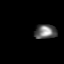
[frame 27/64]
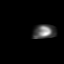
[frame 38/64]
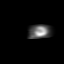
[frame 48/64]
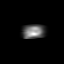
[frame 59/64]
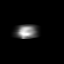

[Series 2: wbr stress-sum-em · 6.40mm/px · 6 of 64 frames shown]
[frame 6/64]
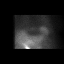
[frame 16/64]
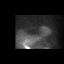
[frame 27/64]
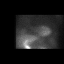
[frame 38/64]
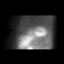
[frame 48/64]
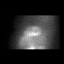
[frame 59/64]
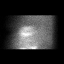

[Series 3: wbr_r-proj_st wbr rest · 6.40mm/px · 6 of 64 frames shown]
[frame 6/64]
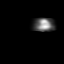
[frame 16/64]
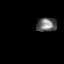
[frame 27/64]
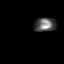
[frame 38/64]
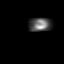
[frame 48/64]
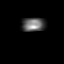
[frame 59/64]
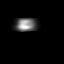

[Series 3: wbr rest · 6.40mm/px · 6 of 64 frames shown]
[frame 6/64]
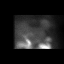
[frame 16/64]
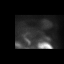
[frame 27/64]
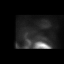
[frame 38/64]
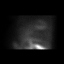
[frame 48/64]
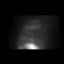
[frame 59/64]
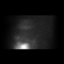

[36 of 36 positions shown; findings below may reference images not displayed]

Canned report from images found in remote index.

Refer to host system for actual result text.

## 2017-03-30 ENCOUNTER — Emergency Department
Admission: EM | Admit: 2017-03-30 | Discharge: 2017-03-30 | Disposition: A | Discharge status: Home / Self Care | Payer: Medicare Other | Attending: Emergency Medicine | Admitting: Emergency Medicine

## 2017-03-30 ENCOUNTER — Emergency Department: Payer: Medicare Other

## 2017-03-30 ENCOUNTER — Encounter: Payer: Self-pay | Admitting: Intensive Care

## 2017-03-30 DIAGNOSIS — N189 Chronic kidney disease, unspecified: Secondary | ICD-10-CM | POA: Insufficient documentation

## 2017-03-30 DIAGNOSIS — J45909 Unspecified asthma, uncomplicated: Secondary | ICD-10-CM | POA: Insufficient documentation

## 2017-03-30 DIAGNOSIS — Z7901 Long term (current) use of anticoagulants: Secondary | ICD-10-CM | POA: Insufficient documentation

## 2017-03-30 DIAGNOSIS — R079 Chest pain, unspecified: Secondary | ICD-10-CM | POA: Insufficient documentation

## 2017-03-30 DIAGNOSIS — Z8673 Personal history of transient ischemic attack (TIA), and cerebral infarction without residual deficits: Secondary | ICD-10-CM | POA: Insufficient documentation

## 2017-03-30 DIAGNOSIS — I5032 Chronic diastolic (congestive) heart failure: Secondary | ICD-10-CM | POA: Diagnosis not present

## 2017-03-30 DIAGNOSIS — E1122 Type 2 diabetes mellitus with diabetic chronic kidney disease: Secondary | ICD-10-CM | POA: Diagnosis not present

## 2017-03-30 DIAGNOSIS — Z79899 Other long term (current) drug therapy: Secondary | ICD-10-CM | POA: Diagnosis not present

## 2017-03-30 DIAGNOSIS — I130 Hypertensive heart and chronic kidney disease with heart failure and stage 1 through stage 4 chronic kidney disease, or unspecified chronic kidney disease: Secondary | ICD-10-CM | POA: Diagnosis not present

## 2017-03-30 LAB — BASIC METABOLIC PANEL
Anion gap: 8 (ref 5–15)
BUN: 27 mg/dL — ABNORMAL HIGH (ref 6–20)
CALCIUM: 9.4 mg/dL (ref 8.9–10.3)
CO2: 24 mmol/L (ref 22–32)
CREATININE: 1.05 mg/dL — AB (ref 0.44–1.00)
Chloride: 105 mmol/L (ref 101–111)
GFR, EST AFRICAN AMERICAN: 58 mL/min — AB (ref >60)
GFR, EST NON AFRICAN AMERICAN: 50 mL/min — AB (ref >60)
GLUCOSE: 129 mg/dL — AB (ref 65–99)
Potassium: 4 mmol/L (ref 3.5–5.1)
Sodium: 137 mmol/L (ref 135–145)

## 2017-03-30 LAB — CBC
HCT: 36.4 % (ref 35.0–47.0)
Hemoglobin: 12.1 g/dL (ref 12.0–16.0)
MCH: 29.1 pg (ref 26.0–34.0)
MCHC: 33.2 g/dL (ref 32.0–36.0)
MCV: 87.7 fL (ref 80.0–100.0)
PLATELETS: 315 10*3/uL (ref 150–440)
RBC: 4.15 MIL/uL (ref 3.80–5.20)
RDW: 15 % — ABNORMAL HIGH (ref 11.5–14.5)
WBC: 7.9 10*3/uL (ref 3.6–11.0)

## 2017-03-30 LAB — TROPONIN I: Troponin I: <0.03 ng/mL (ref <0.03)

## 2017-03-30 MED ORDER — ASPIRIN 81 MG PO CHEW
243.0000 mg | CHEWABLE_TABLET | Freq: Once | ORAL | Status: AC
  Administered 2017-03-30: 243 mg via ORAL
  Filled 2017-03-30: qty 3

## 2017-03-30 NOTE — Discharge Instructions (Signed)
Please seek medical attention for any high fevers, chest pain, shortness of breath, change in behavior, persistent vomiting, bloody stool or any other new or concerning symptoms.  

## 2017-03-30 NOTE — ED Triage Notes (Signed)
Patient c/o chest pressure that started around 11:30 while sitting at table. Some relief with two nitro at home and 81mg  baby aspirin. HX SVT

## 2017-03-30 NOTE — ED Provider Notes (Signed)
[facility] Emergency Department Provider Note   ____________________________________________   I have reviewed the triage vital signs and the nursing notes.   HISTORY  Chief Complaint Chest Pain   History limited by: Not Limited   HPI Tamara [name] is a 76 y.o. female who presents to the emergency department today because of concerns for chest pain. The chest pain started around 11:30 this morning. The patient was sitting at the time. Is located in the center chest. It was pressure-like in quality. The patient did not have any associated shortness of breath although she had some diaphroses.  She tried taking 81 mg of aspirin and 2 nitroglycerin without any significant relief. The pain did subside when she arrived to the emergency department she currently is pain-free. States that her most recent catheterization was roughly 11 months ago and was normal.    Past Medical History:  Diagnosis Date  . Anemia    distant hx of  . Arthritis    KNEES,HANDS,WRIST  . Asthma    allergy related  . 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 Bronchitis   . Cardiomegaly   . CHF (congestive heart failure) (HCC)    stopped using diuretics due to kidney issues, watching salt intake  . Chronic cough   . Chronic kidney disease    due to DM  . Complication of anesthesia    BP bottomed out during Achilles Tendon repair >943yrs ago  . Constipation due to pain medication   . Diabetes mellitus without complication (HCC)    type 2  not on medications  . Diverticulitis   . Dysrhythmia   . Gout   . HAP (hospital-acquired pneumonia) 05/16/2016  . Headache    Sinus headaches  . 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 History of blood transfusion   . Hypertension   . Hypothyroidism   . 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 NSTEMI (non-ST elevated myocardial infarction) (HCC)   . Obesity   . Palpitations   . Pneumonia    hx of-  05/02/16- last time approx - 6 years ago  . Sepsis associated hypotension (HCC) 05/16/2016  . Shortness of breath dyspnea    with exertion  . Sleep apnea    does not use CPAP  . SVT (supraventricular tachycardia) (HCC)   . Wheezing     Patient Active Problem List   Diagnosis Date Noted  . Obesity, Class III, BMI 40-49.9 (morbid obesity) (HCC) 12/30/2016  . Primary localized osteoarthritis of left knee 12/29/2016  . Hypothyroidism 12/29/2016  . Diabetes mellitus (HCC) 12/29/2016  . CHF with left ventricular diastolic dysfunction, NYHA class 2 (HCC) 12/29/2016  . Yeast infection of the skin 12/29/2016  . Hypertension 12/29/2016  . Primary localized osteoarthritis of right knee 05/12/2016  . Paroxysmal SVT (supraventricular tachycardia) (HCC) 03/01/2016  . Preprocedural cardiovascular examination 03/01/2016    Past Surgical History:  Procedure Laterality Date  . ABDOMINAL HYSTERECTOMY    . ABDOMINAL HYSTERECTOMY  1984  . ACHILLES TENDON REPAIR Left   . CARDIAC CATHETERIZATION     [place]  . CARDIAC CATHETERIZATION N/A 05/18/2016   Procedure: Left Heart Cath and Coronary Angiography;  Surgeon: Kathleene Hazelhristopher D McAlhany, MD;  [facility] LAB;  Service: Cardiovascular;  Laterality: N/A;  . CATARACT EXTRACTION W/PHACO Left 02/10/2015   Procedure: CATARACT EXTRACTION PHACO AND INTRAOCULAR LENS PLACEMENT (IOC);  Surgeon:  Lockie Mola, MD;  Location: [facility]ERY [facility]TR;  Service: Ophthalmology;  Laterality: Left;  DIABETIC  . CATARACT EXTRACTION W/PHACO Right 02/24/2015   Procedure: CATARACT EXTRACTION PHACO AND INTRAOCULAR LENS PLACEMENT (IOC);  Surgeon: Lockie Mola, MD;  Location: [facility] SURGERY CNTR;  Service: Ophthalmology;  Laterality: Right;  . CESAREAN SECTION  1978  . CHOLECYSTECTOMY  1986  . EYE SURGERY Bilateral    Cataract   . TOTAL KNEE ARTHROPLASTY Right 05/12/2016   Procedure: TOTAL KNEE ARTHROPLASTY;  Surgeon: Frederico Hamman, MD;  Location: [facility] OR;   Service: Orthopedics;  Laterality: Right;  . TOTAL KNEE ARTHROPLASTY Left 12/29/2016   Procedure: TOTAL KNEE ARTHROPLASTY;  Surgeon: Frederico Hamman, MD;  Location: [facility] OR;  Service: Orthopedics;  Laterality: Left;    Prior to Admission medications   Medication Sig Start Date End Date Taking? Authorizing Provider  acetaminophen (TYLENOL) 500 MG tablet Take 1,000 mg by mouth every 8 (eight) hours as needed for mild pain.    [provider]  apixaban (ELIQUIS) 2.5 MG TABS tablet Take 1 tablet (2.5 mg total) by mouth 2 (two) times daily. 12/29/16   Chadwell, Ivin Booty, PA-C  Biotin w/ Vitamins C & E (HAIR/SKIN/NAILS PO) Take 2 tablets by mouth daily.    [provider]  carvedilol (COREG) 3.125 MG tablet Take 3.125 mg by mouth 2 (two) times daily with a meal.    [provider]  fluconazole (DIFLUCAN) 100 MG tablet Take 1 tablet (100 mg total) by mouth daily. 01/01/17   Frederico Hamman, MD  fluticasone (FLONASE) 50 MCG/ACT nasal spray Place 1 spray into both nostrils 2 (two) times daily as needed for allergies or rhinitis.    [provider]  HYDROcodone-acetaminophen (NORCO) 5-325 MG tablet Take 1-2 tablets by mouth every 4 (four) hours as needed for moderate pain or severe pain. 12/29/16   Chadwell, Ivin Booty, PA-C  levothyroxine (SYNTHROID, LEVOTHROID) 150 MCG tablet Take 150 mcg by mouth daily before breakfast.    [provider]  nitroGLYCERIN (NITROSTAT) 0.4 MG SL tablet Place 0.4 mg under the tongue every 5 (five) minutes as needed for chest pain. Max 3    [provider]  nystatin (MYCOSTATIN/NYSTOP) powder Apply topically 3 (three) times daily. 01/01/17   Frederico Hamman, MD  ONE TOUCH ULTRA TEST test strip Apply 1 strip topically as directed. 02/06/16   [provider]  traMADol (ULTRAM) 50 MG tablet Take 2 tablets (100 mg total) by mouth every 6 (six) hours as needed. 12/29/16   Chadwell, Ivin Booty, PA-C  VITAMIN E PO Take 450 mg by mouth daily.     [provider]    Allergies Codeine  Family History  Problem Relation Age of Onset  . Lung cancer Mother   . Heart attack Mother   . Heart Problems Father   . Hypertension Father   . Other Father        respiratory problems  . Ovarian cancer Maternal Grandmother   . Cancer Maternal Grandfather   . Stroke Paternal Grandmother   . Diabetes Brother   . Liver cancer Brother   . COPD Sister   . Other Sister        sepsis  . Heart attack Son   . Heart attack Son   . Rheum arthritis Daughter   . Rheum arthritis Daughter   . Thyroid cancer Daughter   . Hepatitis C Daughter     Social History Social History  Substance Use Topics  . Smoking status: Never Smoker  .  Smokeless tobacco: Former NeurosurgeonUser  . Alcohol use No    Review of Systems Constitutional: No fever/chills Eyes: No visual changes. ENT: No sore throat. Cardiovascular: Positive for chest pain. Respiratory: Denies shortness of breath. Gastrointestinal: No abdominal pain.  No nausea, no vomiting.  No diarrhea.   Genitourinary: Negative for dysuria. Musculoskeletal: Negative for back pain. Skin: Negative for rash. Neurological: Negative for headaches, focal weakness or numbness.  ____________________________________________   PHYSICAL EXAM:  VITAL SIGNS: ED Triage Vitals [03/30/17 1234]  Enc Vitals Group     BP 102/61     Pulse Rate 85     Resp 18     Temp 98.4 F (36.9 C)     Temp Source Oral     SpO2 96 %     Weight 260 lb (117.9 kg)     Height 5' 2.5" (1.588 m)     Head Circumference      Peak Flow      Pain Score 2   Constitutional: Alert and oriented. Well appearing and in no distress. Eyes: Conjunctivae are normal.  ENT   Head: Normocephalic and atraumatic.   Nose: No congestion/rhinnorhea.   Mouth/Throat: Mucous membranes are moist.   Neck: No stridor. Hematological/Lymphatic/Immunilogical: No cervical lymphadenopathy. Cardiovascular: Normal rate, regular rhythm.  No  murmurs, rubs, or gallops.  Respiratory: Normal respiratory effort without tachypnea nor retractions. Breath sounds are clear and equal bilaterally. No wheezes/rales/rhonchi. Gastrointestinal: Soft and non tender. No rebound. No guarding.  Genitourinary: Deferred Musculoskeletal: Normal range of motion in all extremities. No lower extremity edema. Neurologic:  Normal speech and language. No gross focal neurologic deficits are appreciated.  Skin:  Skin is warm, dry and intact. No rash noted. Psychiatric: Mood and affect are normal. Speech and behavior are normal. Patient exhibits appropriate insight and judgment.  ____________________________________________    LABS (pertinent positives/negatives)  Labs Reviewed  BASIC METABOLIC PANEL - Abnormal; Notable for the following:       Result Value   Glucose, Bld 129 (*)    BUN 27 (*)    Creatinine, Ser 1.05 (*)    GFR calc non Af Amer 50 (*)    GFR calc Af Amer 58 (*)    All other components within normal limits  CBC - Abnormal; Notable for the following:    RDW 15.0 (*)    All other components within normal limits  TROPONIN I  TROPONIN I     ____________________________________________   EKG  I, Phineas SemenGraydon Alisea Matte, attending physician, personally viewed and interpreted this EKG  EKG Time: 1228 Rate: 76 Rhythm: normal sinus rhythm Axis: left axis deviation Intervals: qtc 441 QRS: Incomplete RBBB ST changes: no st elevation, t wave inversion, V1, V2, V3 Impression: abnormal ekg   ____________________________________________    RADIOLOGY  CXR  IMPRESSION: No active cardiopulmonary disease.  ____________________________________________   PROCEDURES  Procedures  ____________________________________________   INITIAL IMPRESSION / ASSESSMENT AND PLAN / ED COURSE  Pertinent labs & imaging results that were available during my care of the patient were reviewed by me and considered in my medical decision making (see  chart for details).  Patient presented to the emergency department today because of concerns for chest pain. This had resolved by the time I evaluated the patient the pain had resolved. Patient's EKG did show some T-wave inversions however these were present on previous EKG. Discussed with patient importance of following up with primary care doctor. Did discuss return precautions.  ____________________________________________   FINAL CLINICAL IMPRESSION(S) /  ED DIAGNOSES  Final diagnoses:  Chest pain, unspecified type     Note: This dictation was prepared with Dragon dictation. Any transcriptional errors that result from this process are unintentional     Phineas Semen, MD 03/30/17 1739

## 2017-11-16 ENCOUNTER — Encounter: Payer: Self-pay | Admitting: *Deleted

## 2017-11-16 ENCOUNTER — Ambulatory Visit
Admission: EM | Admit: 2017-11-16 | Discharge: 2017-11-16 | Disposition: A | Discharge status: Home / Self Care | Payer: Medicare Other | Attending: Family Medicine | Admitting: Family Medicine

## 2017-11-16 DIAGNOSIS — A084 Viral intestinal infection, unspecified: Secondary | ICD-10-CM | POA: Diagnosis not present

## 2017-11-16 MED ORDER — ONDANSETRON 8 MG PO TBDP: 8.0000 mg | ORAL_TABLET | Freq: Three times a day (TID) | ORAL | 0 refills | Status: DC | PRN

## 2017-11-16 MED ORDER — ONDANSETRON 8 MG PO TBDP
8.0000 mg | ORAL_TABLET | Freq: Once | ORAL | Status: AC
  Administered 2017-11-16: 8 mg via ORAL

## 2017-11-16 NOTE — ED Provider Notes (Signed)
[facility]    CSN: [id number] Arrival date & time: 11/16/17  1342     History   Chief Complaint Chief Complaint  Patient presents with  . Headache  . Nausea  . Emesis  . Chills  . Abdominal Pain    HPI Tamara [name] is a 77 y.o. female.   The history is provided by the patient.  Headache  Associated symptoms: abdominal pain (mild, diffuse), diarrhea and vomiting   Associated symptoms: no fever and no URI   Emesis  Associated symptoms: abdominal pain (mild, diffuse), chills, diarrhea and headaches   Associated symptoms: no fever and no URI   Abdominal Pain  Associated symptoms: chills, diarrhea and vomiting   Associated symptoms: no fever   Diarrhea  Quality:  Watery Severity:  Moderate Onset quality:  Sudden Duration:  1 day Timing:  Constant Progression:  Unchanged Relieved by:  Nothing Associated symptoms: abdominal pain (mild, diffuse), chills, headaches and vomiting   Associated symptoms: no recent cough, no diaphoresis, no fever and no URI   Risk factors: sick contacts     Past Medical History:  Diagnosis Date  . Anemia    distant hx of  . Arthritis    KNEES,HANDS,WRIST  . Asthma    allergy related  . 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 Bronchitis   . Cardiomegaly   . CHF (congestive heart failure) (HCC)    stopped using diuretics due to kidney issues, watching salt intake  . Chronic cough   . Chronic kidney disease    due to DM  . Complication of anesthesia    BP bottomed out during Achilles Tendon repair >110yrs ago  . Constipation due to pain medication   . Diabetes mellitus without complication (HCC)    type 2  not on medications  . Diverticulitis   . Dysrhythmia   . Gout   . HAP (hospital-acquired pneumonia) 05/16/2016  . Headache    Sinus headaches  . Hepatitis    childhood Hep A 77 yrs old  . History of blood transfusion   . Hypertension   . Hypothyroidism   . Neuropathy due to secondary diabetes  mellitus (HCC)    feet  . NSTEMI (non-ST elevated myocardial infarction) (HCC)   . Obesity   . Palpitations   . Pneumonia    hx of- 05/02/16- last time approx - 6 years ago  . Sepsis associated hypotension (HCC) 05/16/2016  . Shortness of breath dyspnea    with exertion  . Sleep apnea    does not use CPAP  . SVT (supraventricular tachycardia) (HCC)   . Wheezing     Patient Active Problem List   Diagnosis Date Noted  . Obesity, Class III, BMI 40-49.9 (morbid obesity) (HCC) 12/30/2016  . Primary localized osteoarthritis of left knee 12/29/2016  . Hypothyroidism 12/29/2016  . Diabetes mellitus (HCC) 12/29/2016  . CHF with left ventricular diastolic dysfunction, NYHA class 2 (HCC) 12/29/2016  . Yeast infection of the skin 12/29/2016  . Hypertension 12/29/2016  . Primary localized osteoarthritis of right knee 05/12/2016  . Paroxysmal SVT (supraventricular tachycardia) (HCC) 03/01/2016  . Preprocedural cardiovascular examination 03/01/2016    Past Surgical History:  Procedure Laterality Date  . ABDOMINAL HYSTERECTOMY    . ABDOMINAL HYSTERECTOMY  1984  . ACHILLES TENDON REPAIR Left   . CARDIAC CATHETERIZATION     [place]  . CARDIAC CATHETERIZATION N/A 05/18/2016   Procedure: Left Heart Cath and Coronary  Angiography;  Surgeon: Kathleene Hazel, MD;  Location: [facility] INVASIVE CV LAB;  Service: Cardiovascular;  Laterality: N/A;  . CATARACT EXTRACTION W/PHACO Left 02/10/2015   Procedure: CATARACT EXTRACTION PHACO AND INTRAOCULAR LENS PLACEMENT (IOC);  Surgeon: Lockie Mola, MD;  Location: [facility];  Service: Ophthalmology;  Laterality: Left;  DIABETIC  . CATARACT EXTRACTION W/PHACO Right 02/24/2015   Procedure: CATARACT EXTRACTION PHACO AND INTRAOCULAR LENS PLACEMENT (IOC);  Surgeon: Lockie Mola, MD;  Location: [facility] SURGERY CNTR;  Service: Ophthalmology;  Laterality: Right;  . CESAREAN SECTION  1978  . CHOLECYSTECTOMY  1986  . EYE SURGERY Bilateral     Cataract   . TOTAL KNEE ARTHROPLASTY Right 05/12/2016   Procedure: TOTAL KNEE ARTHROPLASTY;  Surgeon: Frederico Hamman, MD;  Location: [facility] OR;  Service: Orthopedics;  Laterality: Right;  . TOTAL KNEE ARTHROPLASTY Left 12/29/2016   Procedure: TOTAL KNEE ARTHROPLASTY;  Surgeon: Frederico Hamman, MD;  Location: [facility] OR;  Service: Orthopedics;  Laterality: Left;    OB History   None      Home Medications    Prior to Admission medications   Medication Sig Start Date End Date Taking? Authorizing Provider  acetaminophen (TYLENOL) 500 MG tablet Take 1,000 mg by mouth every 8 (eight) hours as needed for mild pain.   Yes [provider]  Biotin w/ Vitamins C & E (HAIR/SKIN/NAILS PO) Take 2 tablets by mouth daily.   Yes [provider]  carvedilol (COREG) 3.125 MG tablet Take 3.125 mg by mouth 2 (two) times daily with a meal.   Yes [provider]  fluticasone (FLONASE) 50 MCG/ACT nasal spray Place 1 spray into both nostrils 2 (two) times daily as needed for allergies or rhinitis.   Yes [provider]  furosemide (LASIX) 20 MG tablet Take 20 mg by mouth daily as needed. 01/18/17  Yes [provider]  glipiZIDE (GLUCOTROL XL) 2.5 MG 24 hr tablet Take 2.5 mg by mouth daily. 03/13/17  Yes [provider]  JANUMET XR [phone] MG TB24 Take 1 tablet by mouth daily. 03/14/17  Yes [provider]  levothyroxine (SYNTHROID, LEVOTHROID) 150 MCG tablet Take 150 mcg by mouth daily before breakfast.   Yes [provider]  nitroGLYCERIN (NITROSTAT) 0.4 MG SL tablet Place 0.4 mg under the tongue every 5 (five) minutes as needed for chest pain. Max 3   Yes [provider]  simvastatin (ZOCOR) 10 MG tablet Take 10 mg by mouth daily.   Yes [provider]  VITAMIN E PO Take 450 mg by mouth daily.   Yes [provider]  apixaban (ELIQUIS) 2.5 MG TABS tablet Take 1 tablet (2.5 mg total) by mouth 2 (two) times daily. Patient not  taking: Reported on 03/30/2017 12/29/16   Chadwell, Ivin Booty, PA-C  gabapentin (NEURONTIN) 300 MG capsule Take 300 mg by mouth 3 (three) times daily. 03/14/17   [provider]  HYDROcodone-acetaminophen (NORCO) 5-325 MG tablet Take 1-2 tablets by mouth every 4 (four) hours as needed for moderate pain or severe pain. Patient not taking: Reported on 03/30/2017 12/29/16   Chadwell, Ivin Booty, PA-C  nystatin (MYCOSTATIN/NYSTOP) powder Apply topically 3 (three) times daily. Patient not taking: Reported on 03/30/2017 01/01/17   Frederico Hamman, MD  ondansetron (ZOFRAN ODT) 8 MG disintegrating tablet Take 1 tablet (8 mg total) by mouth every 8 (eight) hours as needed. 11/16/17   Payton Mccallum, MD  traMADol (ULTRAM) 50 MG tablet Take 2 tablets (100 mg total) by mouth every 6 (six) hours  as needed. 12/29/16   Chadwell, Ivin Booty, PA-C  zolpidem (AMBIEN) 10 MG tablet Take 10 mg by mouth at bedtime as needed. 03/13/17   [provider]    Family History Family History  Problem Relation Age of Onset  . Lung cancer Mother   . Heart attack Mother   . Heart Problems Father   . Hypertension Father   . Other Father        respiratory problems  . Ovarian cancer Maternal Grandmother   . Cancer Maternal Grandfather   . Stroke Paternal Grandmother   . Diabetes Brother   . Liver cancer Brother   . COPD Sister   . Other Sister        sepsis  . Heart attack Son   . Heart attack Son   . Rheum arthritis Daughter   . Rheum arthritis Daughter   . Thyroid cancer Daughter   . Hepatitis C Daughter     Social History Social History   Tobacco Use  . Smoking status: Never Smoker  . Smokeless tobacco: Former Engineer, water Use Topics  . Alcohol use: No  . Drug use: No     Allergies   Codeine   Review of Systems Review of Systems  Constitutional: Positive for chills. Negative for diaphoresis and fever.  Gastrointestinal: Positive for abdominal pain (mild, diffuse), diarrhea and vomiting.    Neurological: Positive for headaches.     Physical Exam Triage Vital Signs ED Triage Vitals  Enc Vitals Group     BP 11/16/17 1413 113/70     Pulse Rate 11/16/17 1413 86     Resp 11/16/17 1413 16     Temp 11/16/17 1413 98.4 F (36.9 C)     Temp Source 11/16/17 1413 Oral     SpO2 11/16/17 1413 97 %     Weight 11/16/17 1416 260 lb (117.9 kg)     Height 11/16/17 1416 5\' 3"  (1.6 m)     Head Circumference --      Peak Flow --      Pain Score 11/16/17 1416 4     Pain Loc --      Pain Edu? --      Excl. in GC? --    No data found.  Updated Vital Signs BP 113/70 (BP Location: Left Arm)   Pulse 86   Temp 98.4 F (36.9 C) (Oral)   Resp 16   Ht 5\' 3"  (1.6 m)   Wt 260 lb (117.9 kg)   SpO2 97%   BMI 46.06 kg/m   Visual Acuity Right Eye Distance:   Left Eye Distance:   Bilateral Distance:    Right Eye Near:   Left Eye Near:    Bilateral Near:     Physical Exam  Constitutional: She appears well-developed and well-nourished. No distress.  Abdominal: Soft. Bowel sounds are normal. She exhibits no distension and no mass. There is no tenderness. There is no rebound and no guarding.  Skin: She is not diaphoretic.  Nursing note and vitals reviewed.    UC Treatments / Results  Labs (all labs ordered are listed, but only abnormal results are displayed) Labs Reviewed - No data to display  EKG None Radiology No results found.  Procedures Procedures (including critical care time)  Medications Ordered in UC Medications  ondansetron (ZOFRAN-ODT) disintegrating tablet 8 mg (8 mg Oral Given 11/16/17 1520)     Initial Impression / Assessment and Plan / UC Course  I have reviewed the  triage vital signs and the nursing notes.  Pertinent labs & imaging results that were available during my care of the patient were reviewed by me and considered in my medical decision making (see chart for details).       Final Clinical Impressions(s) / UC Diagnoses   Final  diagnoses:  Viral gastroenteritis    ED Discharge Orders        Ordered    ondansetron (ZOFRAN ODT) 8 MG disintegrating tablet  Every 8 hours PRN     11/16/17 1523     1. diagnosis reviewed with patient; given zofran 8mg  odt  2. rx as per orders above; reviewed possible side effects, interactions, risks and benefits  3. Recommend supportive treatment with fluids/clear liquids then advance slowly as tolerated 4. Follow-up prn if symptoms worsen or don't improve  Controlled Substance Prescriptions [place] Controlled Substance Registry consulted? Not Applicable   Payton Mccallumonty, Maeghan Canny, MD 11/16/17 [id number]

## 2017-11-16 NOTE — ED Triage Notes (Signed)
This morning onset of headache, N/V, and upper quad abd pain. A friend who is staying with her became sick yesterday with similar symptoms.

## 2019-12-22 ENCOUNTER — Emergency Department: Payer: Medicare PPO

## 2019-12-22 ENCOUNTER — Other Ambulatory Visit: Payer: Self-pay

## 2019-12-22 DIAGNOSIS — R079 Chest pain, unspecified: Secondary | ICD-10-CM | POA: Diagnosis present

## 2019-12-22 DIAGNOSIS — I509 Heart failure, unspecified: Secondary | ICD-10-CM | POA: Insufficient documentation

## 2019-12-22 DIAGNOSIS — E119 Type 2 diabetes mellitus without complications: Secondary | ICD-10-CM | POA: Diagnosis not present

## 2019-12-22 DIAGNOSIS — I110 Hypertensive heart disease with heart failure: Secondary | ICD-10-CM | POA: Insufficient documentation

## 2019-12-22 DIAGNOSIS — Z79899 Other long term (current) drug therapy: Secondary | ICD-10-CM | POA: Insufficient documentation

## 2019-12-22 DIAGNOSIS — Z20822 Contact with and (suspected) exposure to covid-19: Secondary | ICD-10-CM | POA: Insufficient documentation

## 2019-12-22 DIAGNOSIS — R531 Weakness: Secondary | ICD-10-CM | POA: Diagnosis not present

## 2019-12-22 DIAGNOSIS — E860 Dehydration: Secondary | ICD-10-CM | POA: Insufficient documentation

## 2019-12-22 DIAGNOSIS — E039 Hypothyroidism, unspecified: Secondary | ICD-10-CM | POA: Diagnosis not present

## 2019-12-22 DIAGNOSIS — Z7901 Long term (current) use of anticoagulants: Secondary | ICD-10-CM | POA: Diagnosis not present

## 2019-12-22 DIAGNOSIS — R0602 Shortness of breath: Secondary | ICD-10-CM | POA: Diagnosis not present

## 2019-12-22 LAB — CBC
HCT: 40.2 % (ref 36.0–46.0)
Hemoglobin: 12.8 g/dL (ref 12.0–15.0)
MCH: 27.4 pg (ref 26.0–34.0)
MCHC: 31.8 g/dL (ref 30.0–36.0)
MCV: 86.1 fL (ref 80.0–100.0)
Platelets: 310 10*3/uL (ref 150–400)
RBC: 4.67 MIL/uL (ref 3.87–5.11)
RDW: 15.4 % (ref 11.5–15.5)
WBC: 10.3 10*3/uL (ref 4.0–10.5)
nRBC: 0 % (ref 0.0–0.2)

## 2019-12-22 LAB — BASIC METABOLIC PANEL
Anion gap: 12 (ref 5–15)
BUN: 26 mg/dL — ABNORMAL HIGH (ref 8–23)
CO2: 26 mmol/L (ref 22–32)
Calcium: 9.3 mg/dL (ref 8.9–10.3)
Chloride: 100 mmol/L (ref 98–111)
Creatinine, Ser: 1.34 mg/dL — ABNORMAL HIGH (ref 0.44–1.00)
GFR calc Af Amer: 44 mL/min — ABNORMAL LOW (ref >60)
GFR calc non Af Amer: 38 mL/min — ABNORMAL LOW (ref >60)
Glucose, Bld: 115 mg/dL — ABNORMAL HIGH (ref 70–99)
Potassium: 3.4 mmol/L — ABNORMAL LOW (ref 3.5–5.1)
Sodium: 138 mmol/L (ref 135–145)

## 2019-12-22 LAB — TROPONIN I (HIGH SENSITIVITY): Troponin I (High Sensitivity): 4 ng/L (ref <18)

## 2019-12-22 NOTE — ED Triage Notes (Addendum)
Pt arrives to ED via POV from home with c/o weakness, SHOB, and palpitations x1 week. Pt denies CP at this time. Pt reports (+) nausea, but no vomiting or diarrhea. Pt is A&O, in NAD; RR even, regular, and unlabored.

## 2019-12-23 ENCOUNTER — Emergency Department
Admission: EM | Admit: 2019-12-23 | Discharge: 2019-12-23 | Disposition: A | Discharge status: Home / Self Care | Payer: Medicare PPO | Attending: Emergency Medicine | Admitting: Emergency Medicine

## 2019-12-23 DIAGNOSIS — R531 Weakness: Secondary | ICD-10-CM

## 2019-12-23 DIAGNOSIS — E860 Dehydration: Secondary | ICD-10-CM

## 2019-12-23 LAB — RESPIRATORY PANEL BY RT PCR (FLU A&B, COVID)
Influenza A by PCR: NEGATIVE
Influenza B by PCR: NEGATIVE
SARS Coronavirus 2 by RT PCR: NEGATIVE

## 2019-12-23 LAB — URINALYSIS, COMPLETE (UACMP) WITH MICROSCOPIC
Bacteria, UA: NONE SEEN
Bilirubin Urine: NEGATIVE
Glucose, UA: >=500 mg/dL — AB
Hgb urine dipstick: NEGATIVE
Ketones, ur: NEGATIVE mg/dL
Leukocytes,Ua: NEGATIVE
Nitrite: NEGATIVE
Protein, ur: NEGATIVE mg/dL
Specific Gravity, Urine: 1.022 (ref 1.005–1.030)
pH: 5 (ref 5.0–8.0)

## 2019-12-23 LAB — TROPONIN I (HIGH SENSITIVITY): Troponin I (High Sensitivity): 4 ng/L (ref <18)

## 2019-12-23 MED ORDER — LACTATED RINGERS IV BOLUS: 500.0000 mL | Freq: Once | INTRAVENOUS | Status: DC

## 2019-12-23 MED ORDER — LACTATED RINGERS IV BOLUS
1000.0000 mL | Freq: Once | INTRAVENOUS | Status: AC
  Administered 2019-12-23: 1000 mL via INTRAVENOUS

## 2019-12-23 NOTE — ED Notes (Signed)
Pt states that she has felt fatigued for the last 1 week. States she has to take breaks while doing tasks around the house like cleaning. Pt states 2 weeks ago she had an abscess in her mouth around teeth and tooth ABX for it and was running fever while on tx. Pt states she still has pain for mouth to ear on right side

## 2019-12-23 NOTE — ED Provider Notes (Signed)
[facility] Emergency Department Provider Note  ____________________________________________  Time seen: Approximately 3:48 AM  I have reviewed the triage vital signs and the nursing notes.   HISTORY  Chief Complaint Chest Pain and Shortness of Breath   HPI Tamara [name] is a 79 y.o. female with a history of anemia, CHF with preserved EF, chronic cough, chronic kidney disease, diabetes, hypertension, hypothyroidism, SVT who presents from home for evaluation of generalized fatigue and dizziness.  Patient reports that she started feeling unwell about a week ago.  She had 3 or 4 days of a low-grade fever with T-max of 100.35F.,  She also had mild diarrhea.  She has had some sinus congestion and mild sore throat.  The fever resolved.  No cough.  She has been feeling extremely tired but no body aches.  She also reports intermittent episodes of dizziness and feeling like she is going to pass out.  She has had intermittent fluttering sensation in her chest.  She denies chest pain or shortness of breath, vertigo, dysarthria, dysphagia, diplopia, unilateral weakness or numbness.  She has had decreased appetite this week due to not feeling well.  She denies dysuria or hematuria.  She received [employer] vaccination the beginning of this month.  She denies any headaches.  She denies any known exposures to Covid.  She is currently on amoxicillin for a tooth cavity.  No recent surgeries.   Past Medical History:  Diagnosis Date  . Anemia    distant hx of  . Arthritis    KNEES,HANDS,WRIST  . Asthma    allergy related  . 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 Bronchitis   . Cardiomegaly   . CHF (congestive heart failure) (HCC)    stopped using diuretics due to kidney issues, watching salt intake  . Chronic cough   . Chronic kidney disease    due to DM  . Complication of anesthesia    BP bottomed out during Achilles Tendon repair >21yrs ago  .  Constipation due to pain medication   . Diabetes mellitus without complication (HCC)    type 2  not on medications  . Diverticulitis   . Dysrhythmia   . Gout   . HAP (hospital-acquired pneumonia) 05/16/2016  . Headache    Sinus headaches  . 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 History of blood transfusion   . Hypertension   . Hypothyroidism   . 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 NSTEMI (non-ST elevated myocardial infarction) (HCC)   . Obesity   . Palpitations   . Pneumonia    hx of- 05/02/16- last time approx - 6 years ago  . Sepsis associated hypotension (HCC) 05/16/2016  . Shortness of breath dyspnea    with exertion  . Sleep apnea    does not use CPAP  . SVT (supraventricular tachycardia) (HCC)   . Wheezing     Patient Active Problem List   Diagnosis Date Noted  . Obesity, Class III, BMI 40-49.9 (morbid obesity) (HCC) 12/30/2016  . Primary localized osteoarthritis of left knee 12/29/2016  . Hypothyroidism 12/29/2016  . Diabetes mellitus (HCC) 12/29/2016  . CHF with left ventricular diastolic dysfunction, NYHA class 2 (HCC) 12/29/2016  . Yeast infection of the skin 12/29/2016  . Hypertension 12/29/2016  . Primary localized osteoarthritis of right knee 05/12/2016  . Paroxysmal SVT (supraventricular tachycardia) (HCC) 03/01/2016  .  Preprocedural cardiovascular examination 03/01/2016    Past Surgical History:  Procedure Laterality Date  . ABDOMINAL HYSTERECTOMY    . ABDOMINAL HYSTERECTOMY  1984  . ACHILLES TENDON REPAIR Left   . CARDIAC CATHETERIZATION     [place]  . CARDIAC CATHETERIZATION N/A 05/18/2016   Procedure: Left Heart Cath and Coronary Angiography;  Surgeon: Kathleene Hazel, MD;  Location: [facility] INVASIVE CV LAB;  Service: Cardiovascular;  Laterality: N/A;  . CATARACT EXTRACTION W/PHACO Left 02/10/2015   Procedure: CATARACT EXTRACTION PHACO AND INTRAOCULAR LENS PLACEMENT (IOC);  Surgeon: Lockie Mola, MD;   Location: [facility];  Service: Ophthalmology;  Laterality: Left;  DIABETIC  . CATARACT EXTRACTION W/PHACO Right 02/24/2015   Procedure: CATARACT EXTRACTION PHACO AND INTRAOCULAR LENS PLACEMENT (IOC);  Surgeon: Lockie Mola, MD;  Location: [facility];  Service: Ophthalmology;  Laterality: Right;  . CESAREAN SECTION  1978  . CHOLECYSTECTOMY  1986  . EYE SURGERY Bilateral    Cataract   . TOTAL KNEE ARTHROPLASTY Right 05/12/2016   Procedure: TOTAL KNEE ARTHROPLASTY;  Surgeon: Frederico Hamman, MD;  Location: [facility] OR;  Service: Orthopedics;  Laterality: Right;  . TOTAL KNEE ARTHROPLASTY Left 12/29/2016   Procedure: TOTAL KNEE ARTHROPLASTY;  Surgeon: Frederico Hamman, MD;  Location: [facility] OR;  Service: Orthopedics;  Laterality: Left;    Prior to Admission medications   Medication Sig Start Date End Date Taking? Authorizing Provider  acetaminophen (TYLENOL) 500 MG tablet Take 1,000 mg by mouth every 8 (eight) hours as needed for mild pain.    [provider]  apixaban (ELIQUIS) 2.5 MG TABS tablet Take 1 tablet (2.5 mg total) by mouth 2 (two) times daily. Patient not taking: Reported on 03/30/2017 12/29/16   Chadwell, Ivin Booty, PA-C  Biotin w/ Vitamins C & E (HAIR/SKIN/NAILS PO) Take 2 tablets by mouth daily.    [provider]  carvedilol (COREG) 3.125 MG tablet Take 3.125 mg by mouth 2 (two) times daily with a meal.    [provider]  fluticasone (FLONASE) 50 MCG/ACT nasal spray Place 1 spray into both nostrils 2 (two) times daily as needed for allergies or rhinitis.    [provider]  furosemide (LASIX) 20 MG tablet Take 20 mg by mouth daily as needed. 01/18/17   [provider]  gabapentin (NEURONTIN) 300 MG capsule Take 300 mg by mouth 3 (three) times daily. 03/14/17   [provider]  glipiZIDE (GLUCOTROL XL) 2.5 MG 24 hr tablet Take 2.5 mg by mouth daily. 03/13/17   [provider]  HYDROcodone-acetaminophen (NORCO) 5-325 MG  tablet Take 1-2 tablets by mouth every 4 (four) hours as needed for moderate pain or severe pain. Patient not taking: Reported on 03/30/2017 12/29/16   Chadwell, Ivin Booty, PA-C  JANUMET XR [phone] MG TB24 Take 1 tablet by mouth daily. 03/14/17   [provider]  levothyroxine (SYNTHROID, LEVOTHROID) 150 MCG tablet Take 150 mcg by mouth daily before breakfast.    [provider]  nitroGLYCERIN (NITROSTAT) 0.4 MG SL tablet Place 0.4 mg under the tongue every 5 (five) minutes as needed for chest pain. Max 3    [provider]  nystatin (MYCOSTATIN/NYSTOP) powder Apply topically 3 (three) times daily. Patient not taking: Reported on 03/30/2017 01/01/17   Frederico Hamman, MD  ondansetron (ZOFRAN ODT) 8 MG disintegrating tablet Take 1 tablet (8 mg total) by mouth every 8 (eight) hours as needed. 11/16/17   Payton Mccallum, MD  simvastatin (ZOCOR) 10 MG tablet Take 10 mg by  mouth daily.    [provider]  traMADol (ULTRAM) 50 MG tablet Take 2 tablets (100 mg total) by mouth every 6 (six) hours as needed. 12/29/16   Chadwell, Ivin Booty, PA-C  VITAMIN E PO Take 450 mg by mouth daily.    [provider]  zolpidem (AMBIEN) 10 MG tablet Take 10 mg by mouth at bedtime as needed. 03/13/17   [provider]    Allergies Codeine  Family History  Problem Relation Age of Onset  . Lung cancer Mother   . Heart attack Mother   . Heart Problems Father   . Hypertension Father   . Other Father        respiratory problems  . Ovarian cancer Maternal Grandmother   . Cancer Maternal Grandfather   . Stroke Paternal Grandmother   . Diabetes Brother   . Liver cancer Brother   . COPD Sister   . Other Sister        sepsis  . Heart attack Son   . Heart attack Son   . Rheum arthritis Daughter   . Rheum arthritis Daughter   . Thyroid cancer Daughter   . Hepatitis C Daughter     Social History Social History   Tobacco Use  . Smoking status: Never Smoker  . Smokeless  tobacco: Former Engineer, water Use Topics  . Alcohol use: No  . Drug use: No    Review of Systems  Constitutional: + fever, generalized weakness and fatigue Eyes: Negative for visual changes. ENT: + sore throat and congestion Neck: No neck pain  Cardiovascular: Negative for chest pain. + fluttering Respiratory: Negative for shortness of breath. Gastrointestinal: Negative for abdominal pain, vomiting or diarrhea. Genitourinary: Negative for dysuria. Musculoskeletal: Negative for back pain. Skin: Negative for rash. Neurological: Negative for headaches, weakness or numbness. Psych: No SI or HI  ____________________________________________   PHYSICAL EXAM:  VITAL SIGNS: ED Triage Vitals [12/22/19 2130]  Enc Vitals Group     BP (!) 111/55     Pulse Rate 72     Resp 16     Temp 98.3 F (36.8 C)     Temp Source Oral     SpO2 97 %     Weight 252 lb (114.3 kg)     Height 5\' 2"  (1.575 m)     Head Circumference      Peak Flow      Pain Score 0     Pain Loc      Pain Edu?      Excl. in GC?     Constitutional: Alert and oriented. Well appearing and in no apparent distress. HEENT:      Head: Normocephalic and atraumatic.         Eyes: Conjunctivae are normal. Sclera is non-icteric.       Mouth/Throat: Mucous membranes are moist.       Neck: Supple with no signs of meningismus. Cardiovascular: Regular rate and rhythm. No murmurs, gallops, or rubs. 2+ symmetrical distal pulses are present in all extremities. No JVD. Respiratory: Normal respiratory effort. Lungs are clear to auscultation bilaterally. No wheezes, crackles, or rhonchi.  Gastrointestinal: Soft, non tender, and non distended with positive bowel sounds. No rebound or guarding. Musculoskeletal: No edema, cyanosis, or erythema of extremities. Neurologic: Normal speech and language. Face is symmetric. Moving all extremities. No gross focal neurologic deficits are appreciated. Skin: Skin is warm, dry and intact. No  rash noted. Psychiatric: Mood and affect are normal. Speech and  behavior are normal.  ____________________________________________   LABS (all labs ordered are listed, but only abnormal results are displayed)  Labs Reviewed  BASIC METABOLIC PANEL - Abnormal; Notable for the following components:      Result Value   Potassium 3.4 (*)    Glucose, Bld 115 (*)    BUN 26 (*)    Creatinine, Ser 1.34 (*)    GFR calc non Af Amer 38 (*)    GFR calc Af Amer 44 (*)    All other components within normal limits  URINALYSIS, COMPLETE (UACMP) WITH MICROSCOPIC - Abnormal; Notable for the following components:   Color, Urine YELLOW (*)    APPearance HAZY (*)    Glucose, UA >=500 (*)    All other components within normal limits  RESPIRATORY PANEL BY RT PCR (FLU A&B, COVID)  CBC  TROPONIN I (HIGH SENSITIVITY)  TROPONIN I (HIGH SENSITIVITY)   ____________________________________________  EKG  ED ECG REPORT I, Nita Sicklearolina Crandall Harvel, the attending physician, personally viewed and interpreted this ECG.  Normal sinus rhythm, rate of 69, incomplete right bundle branch block, normal QTC, left axis deviation, no ST elevations or depressions. ____________________________________________  RADIOLOGY  I have personally reviewed the images performed during this visit and I agree with the Radiologist's read.   Interpretation by Radiologist:  DG Chest 2 View  Result Date: 12/22/2019 CLINICAL DATA:  Shortness of breath and weakness. Palpitations. EXAM: CHEST - 2 VIEW COMPARISON:  Radiograph 03/30/2017 FINDINGS: The cardiomediastinal contours are normal. Mild hyperinflation. Linear bandlike areas of scarring in the perihilar and basilar regions. Pulmonary vasculature is normal. No consolidation, pleural effusion, or pneumothorax. No acute osseous abnormalities are seen. IMPRESSION: Mild hyperinflation and perihilar and basilar scarring. No acute abnormality. Electronically Signed   By: Narda RutherfordMelanie  Sanford M.D.    On: 12/22/2019 22:00    ____________________________________________   PROCEDURES  Procedure(s) performed:yes .1-3 Lead EKG Interpretation Performed by: Nita SickleVeronese, [place], MD Authorized by: Nita SickleVeronese, [place], MD     Interpretation: normal     ECG rate assessment: normal     Rhythm: sinus rhythm     Ectopy: none     Conduction: normal     Critical Care performed:  None ____________________________________________   INITIAL IMPRESSION / ASSESSMENT AND PLAN / ED COURSE   79 y.o. female with a history of anemia, CHF with preserved EF, chronic cough, chronic kidney disease, diabetes, hypertension, hypothyroidism, SVT who presents from home for evaluation of generalized fatigue and lightheadedness x1 week in the setting of 3 to 4 days of fever, diarrhea and sore throat.  Recently vaccinated for Covid less than 3 weeks ago with [employer].  No headache.  No neuro deficits.  History gathered from patient and her daughter who was at bedside.  Old medical records reviewed.  Patient is well-appearing, neurologically intact, has normal vital signs, no fever, abdomen is soft, heart regular rate and rhythm, no leg swelling, lungs are clear to auscultation.  Differential diagnosis including dehydration versus viral illness versus Covid versus flu versus UTI versus anemia versus electrolyte derangements versus dysrhythmia.  EKG with no signs of dysrhythmias or ischemia.  Patient connected to telemetry for close monitoring.  Labs showing some elevation in her creatinine from 1-1.34, no significant electrolyte derangements, no hypo or hyperglycemia, no DKA, troponin x2 -, no anemia or leukocytosis.  Chest x-ray visualized by me with no acute findings, confirmed by radiology.  We will give IV fluids, check urinalysis, check Covid and flu, check orthostatics and reassess  for disposition.     _________________________ 6:38 AM on  12/23/2019 -----------------------------------------  Patient reassessment: Patient feels improved after IV fluids.  Remains hemodynamically stable, orthostatic negative.  Urinalysis negative for UTI.  Covid and flu negative. Most likely mild dehydration after a viral illness.  Because results with patient and her daughter, follow-up with PCP, and my standard return precautions.  Recommended increase oral hydration.  _____________________________________________ Please note:  Patient was evaluated in Emergency Department today for the symptoms described in the history of present illness. Patient was evaluated in the context of the global COVID-19 pandemic, which necessitated consideration that the patient might be at risk for infection with the SARS-CoV-2 virus that causes COVID-19. Institutional protocols and algorithms that pertain to the evaluation of patients at risk for COVID-19 are in a state of rapid change based on information released by regulatory bodies including the CDC and federal and state organizations. These policies and algorithms were followed during the patient's care in the ED.  Some ED evaluations and interventions may be delayed as a result of limited staffing during the pandemic.   [place] Controlled Substance Database was reviewed by me. ____________________________________________   FINAL CLINICAL IMPRESSION(S) / ED DIAGNOSES   Final diagnoses:  Generalized weakness  Dehydration      NEW MEDICATIONS STARTED DURING THIS VISIT:  ED Discharge Orders    None       Note:  This document was prepared using Dragon voice recognition software and may include unintentional dictation errors.    Alfred Levins, [place], MD 12/23/19 [phone]

## 2019-12-23 NOTE — ED Notes (Signed)
Warm blanket provided to pt and visitor.

## 2019-12-23 NOTE — ED Notes (Signed)
Pt to toilet to provide sample

## 2019-12-23 NOTE — ED Notes (Signed)
Pt currently changing into gown at this time.

## 2020-08-24 ENCOUNTER — Ambulatory Visit
Admission: RE | Admit: 2020-08-24 | Discharge: 2020-08-24 | Disposition: A | Discharge status: Home / Self Care | Payer: Medicare PPO | Source: Ambulatory Visit | Attending: Endocrinology | Admitting: Endocrinology

## 2020-08-24 ENCOUNTER — Other Ambulatory Visit: Payer: Self-pay

## 2020-08-24 ENCOUNTER — Ambulatory Visit
Admission: RE | Admit: 2020-08-24 | Discharge: 2020-08-24 | Disposition: A | Discharge status: Home / Self Care | Payer: Medicare PPO | Attending: Endocrinology | Admitting: Endocrinology

## 2020-08-24 ENCOUNTER — Other Ambulatory Visit: Payer: Self-pay | Admitting: Endocrinology

## 2020-08-24 DIAGNOSIS — R0989 Other specified symptoms and signs involving the circulatory and respiratory systems: Secondary | ICD-10-CM | POA: Insufficient documentation

## 2021-11-22 ENCOUNTER — Other Ambulatory Visit: Payer: Self-pay

## 2021-11-22 ENCOUNTER — Emergency Department: Payer: Medicare Other

## 2021-11-22 ENCOUNTER — Inpatient Hospital Stay
Admission: EM | Admit: 2021-11-22 | Discharge: 2021-11-24 | DRG: 871 | Disposition: A | Discharge status: Home / Self Care | Payer: Medicare Other | Attending: Internal Medicine | Admitting: Internal Medicine

## 2021-11-22 DIAGNOSIS — Z833 Family history of diabetes mellitus: Secondary | ICD-10-CM

## 2021-11-22 DIAGNOSIS — J9601 Acute respiratory failure with hypoxia: Secondary | ICD-10-CM | POA: Diagnosis not present

## 2021-11-22 DIAGNOSIS — Z825 Family history of asthma and other chronic lower respiratory diseases: Secondary | ICD-10-CM

## 2021-11-22 DIAGNOSIS — Z20822 Contact with and (suspected) exposure to covid-19: Secondary | ICD-10-CM | POA: Diagnosis not present

## 2021-11-22 DIAGNOSIS — A0839 Other viral enteritis: Secondary | ICD-10-CM | POA: Diagnosis not present

## 2021-11-22 DIAGNOSIS — R112 Nausea with vomiting, unspecified: Secondary | ICD-10-CM

## 2021-11-22 DIAGNOSIS — X58XXXA Exposure to other specified factors, initial encounter: Secondary | ICD-10-CM | POA: Diagnosis not present

## 2021-11-22 DIAGNOSIS — K529 Noninfective gastroenteritis and colitis, unspecified: Secondary | ICD-10-CM | POA: Diagnosis not present

## 2021-11-22 DIAGNOSIS — M19031 Primary osteoarthritis, right wrist: Secondary | ICD-10-CM | POA: Diagnosis present

## 2021-11-22 DIAGNOSIS — I471 Supraventricular tachycardia: Secondary | ICD-10-CM | POA: Diagnosis present

## 2021-11-22 DIAGNOSIS — Z9842 Cataract extraction status, left eye: Secondary | ICD-10-CM

## 2021-11-22 DIAGNOSIS — I5032 Chronic diastolic (congestive) heart failure: Secondary | ICD-10-CM | POA: Diagnosis present

## 2021-11-22 DIAGNOSIS — E039 Hypothyroidism, unspecified: Secondary | ICD-10-CM | POA: Diagnosis present

## 2021-11-22 DIAGNOSIS — I130 Hypertensive heart and chronic kidney disease with heart failure and stage 1 through stage 4 chronic kidney disease, or unspecified chronic kidney disease: Secondary | ICD-10-CM | POA: Diagnosis not present

## 2021-11-22 DIAGNOSIS — T17908A Unspecified foreign body in respiratory tract, part unspecified causing other injury, initial encounter: Secondary | ICD-10-CM

## 2021-11-22 DIAGNOSIS — Z87891 Personal history of nicotine dependence: Secondary | ICD-10-CM

## 2021-11-22 DIAGNOSIS — J45909 Unspecified asthma, uncomplicated: Secondary | ICD-10-CM | POA: Diagnosis not present

## 2021-11-22 DIAGNOSIS — N1831 Chronic kidney disease, stage 3a: Secondary | ICD-10-CM | POA: Diagnosis not present

## 2021-11-22 DIAGNOSIS — G4733 Obstructive sleep apnea (adult) (pediatric): Secondary | ICD-10-CM | POA: Diagnosis present

## 2021-11-22 DIAGNOSIS — E1122 Type 2 diabetes mellitus with diabetic chronic kidney disease: Secondary | ICD-10-CM | POA: Diagnosis not present

## 2021-11-22 DIAGNOSIS — A419 Sepsis, unspecified organism: Secondary | ICD-10-CM | POA: Diagnosis not present

## 2021-11-22 DIAGNOSIS — T17918A Gastric contents in respiratory tract, part unspecified causing other injury, initial encounter: Secondary | ICD-10-CM | POA: Diagnosis not present

## 2021-11-22 DIAGNOSIS — E1140 Type 2 diabetes mellitus with diabetic neuropathy, unspecified: Secondary | ICD-10-CM | POA: Diagnosis not present

## 2021-11-22 DIAGNOSIS — M19042 Primary osteoarthritis, left hand: Secondary | ICD-10-CM | POA: Diagnosis present

## 2021-11-22 DIAGNOSIS — I5033 Acute on chronic diastolic (congestive) heart failure: Secondary | ICD-10-CM | POA: Diagnosis present

## 2021-11-22 DIAGNOSIS — M109 Gout, unspecified: Secondary | ICD-10-CM | POA: Diagnosis present

## 2021-11-22 DIAGNOSIS — Z9049 Acquired absence of other specified parts of digestive tract: Secondary | ICD-10-CM

## 2021-11-22 DIAGNOSIS — I959 Hypotension, unspecified: Secondary | ICD-10-CM | POA: Diagnosis not present

## 2021-11-22 DIAGNOSIS — E119 Type 2 diabetes mellitus without complications: Secondary | ICD-10-CM

## 2021-11-22 DIAGNOSIS — Z8249 Family history of ischemic heart disease and other diseases of the circulatory system: Secondary | ICD-10-CM

## 2021-11-22 DIAGNOSIS — Z8701 Personal history of pneumonia (recurrent): Secondary | ICD-10-CM | POA: Diagnosis not present

## 2021-11-22 DIAGNOSIS — M19041 Primary osteoarthritis, right hand: Secondary | ICD-10-CM | POA: Diagnosis present

## 2021-11-22 DIAGNOSIS — Z6841 Body Mass Index (BMI) 40.0 and over, adult: Secondary | ICD-10-CM

## 2021-11-22 DIAGNOSIS — M19032 Primary osteoarthritis, left wrist: Secondary | ICD-10-CM | POA: Diagnosis present

## 2021-11-22 DIAGNOSIS — Z79899 Other long term (current) drug therapy: Secondary | ICD-10-CM

## 2021-11-22 DIAGNOSIS — Z9841 Cataract extraction status, right eye: Secondary | ICD-10-CM

## 2021-11-22 DIAGNOSIS — E66813 Obesity, class 3: Secondary | ICD-10-CM | POA: Diagnosis present

## 2021-11-22 DIAGNOSIS — Z8679 Personal history of other diseases of the circulatory system: Secondary | ICD-10-CM

## 2021-11-22 DIAGNOSIS — Z885 Allergy status to narcotic agent status: Secondary | ICD-10-CM

## 2021-11-22 DIAGNOSIS — Z90710 Acquired absence of both cervix and uterus: Secondary | ICD-10-CM

## 2021-11-22 DIAGNOSIS — Z79890 Hormone replacement therapy (postmenopausal): Secondary | ICD-10-CM

## 2021-11-22 DIAGNOSIS — Z8619 Personal history of other infectious and parasitic diseases: Secondary | ICD-10-CM | POA: Diagnosis not present

## 2021-11-22 DIAGNOSIS — M170 Bilateral primary osteoarthritis of knee: Secondary | ICD-10-CM | POA: Diagnosis present

## 2021-11-22 DIAGNOSIS — I252 Old myocardial infarction: Secondary | ICD-10-CM | POA: Diagnosis not present

## 2021-11-22 DIAGNOSIS — Z7984 Long term (current) use of oral hypoglycemic drugs: Secondary | ICD-10-CM

## 2021-11-22 DIAGNOSIS — I5031 Acute diastolic (congestive) heart failure: Secondary | ICD-10-CM | POA: Diagnosis not present

## 2021-11-22 DIAGNOSIS — Z96653 Presence of artificial knee joint, bilateral: Secondary | ICD-10-CM | POA: Diagnosis present

## 2021-11-22 DIAGNOSIS — Z961 Presence of intraocular lens: Secondary | ICD-10-CM | POA: Diagnosis present

## 2021-11-22 LAB — CBC
HCT: 43.6 % (ref 36.0–46.0)
Hemoglobin: 13.7 g/dL (ref 12.0–15.0)
MCH: 28.5 pg (ref 26.0–34.0)
MCHC: 31.4 g/dL (ref 30.0–36.0)
MCV: 90.6 fL (ref 80.0–100.0)
Platelets: 282 10*3/uL (ref 150–400)
RBC: 4.81 MIL/uL (ref 3.87–5.11)
RDW: 14.6 % (ref 11.5–15.5)
WBC: 12.4 10*3/uL — ABNORMAL HIGH (ref 4.0–10.5)
nRBC: 0 % (ref 0.0–0.2)

## 2021-11-22 LAB — COMPREHENSIVE METABOLIC PANEL
ALT: 22 U/L (ref 0–44)
AST: 30 U/L (ref 15–41)
Albumin: 3.6 g/dL (ref 3.5–5.0)
Alkaline Phosphatase: 55 U/L (ref 38–126)
Anion gap: 9 (ref 5–15)
BUN: 27 mg/dL — ABNORMAL HIGH (ref 8–23)
CO2: 26 mmol/L (ref 22–32)
Calcium: 8.9 mg/dL (ref 8.9–10.3)
Chloride: 105 mmol/L (ref 98–111)
Creatinine, Ser: 1.02 mg/dL — ABNORMAL HIGH (ref 0.44–1.00)
GFR, Estimated: 56 mL/min — ABNORMAL LOW (ref >60)
Glucose, Bld: 160 mg/dL — ABNORMAL HIGH (ref 70–99)
Potassium: 4 mmol/L (ref 3.5–5.1)
Sodium: 140 mmol/L (ref 135–145)
Total Bilirubin: 1 mg/dL (ref 0.3–1.2)
Total Protein: 8 g/dL (ref 6.5–8.1)

## 2021-11-22 LAB — LACTIC ACID, PLASMA: Lactic Acid, Venous: 1.7 mmol/L (ref 0.5–1.9)

## 2021-11-22 LAB — TROPONIN I (HIGH SENSITIVITY): Troponin I (High Sensitivity): 5 ng/L (ref <18)

## 2021-11-22 LAB — RESP PANEL BY RT-PCR (FLU A&B, COVID) ARPGX2
Influenza A by PCR: NEGATIVE
Influenza B by PCR: NEGATIVE
SARS Coronavirus 2 by RT PCR: NEGATIVE

## 2021-11-22 LAB — LIPASE, BLOOD: Lipase: 42 U/L (ref 11–51)

## 2021-11-22 LAB — CBG MONITORING, ED: Glucose-Capillary: 145 mg/dL — ABNORMAL HIGH (ref 70–99)

## 2021-11-22 LAB — BRAIN NATRIURETIC PEPTIDE: B Natriuretic Peptide: 53.1 pg/mL (ref 0.0–100.0)

## 2021-11-22 MED ORDER — SODIUM CHLORIDE 0.9 % IV SOLN
2.0000 g | INTRAVENOUS | Status: DC
  Administered 2021-11-22: 2 g via INTRAVENOUS
  Filled 2021-11-22: qty 20

## 2021-11-22 MED ORDER — INSULIN ASPART 100 UNIT/ML IJ SOLN
0.0000 [IU] | INTRAMUSCULAR | Status: DC
  Administered 2021-11-23 – 2021-11-24 (×7): 3 [IU] via SUBCUTANEOUS
  Filled 2021-11-22 (×7): qty 1

## 2021-11-22 MED ORDER — SODIUM CHLORIDE 0.9 % IV SOLN: INTRAVENOUS | Status: DC

## 2021-11-22 MED ORDER — SODIUM CHLORIDE 0.9 % IV BOLUS
500.0000 mL | Freq: Once | INTRAVENOUS | Status: AC
  Administered 2021-11-22: 500 mL via INTRAVENOUS

## 2021-11-22 MED ORDER — SIMVASTATIN 20 MG PO TABS
20.0000 mg | ORAL_TABLET | Freq: Every day | ORAL | Status: DC
  Administered 2021-11-22 – 2021-11-24 (×3): 20 mg via ORAL
  Filled 2021-11-22: qty 2
  Filled 2021-11-22: qty 1
  Filled 2021-11-22: qty 2

## 2021-11-22 MED ORDER — IPRATROPIUM-ALBUTEROL 0.5-2.5 (3) MG/3ML IN SOLN
3.0000 mL | Freq: Once | RESPIRATORY_TRACT | Status: AC
  Administered 2021-11-22: 3 mL via RESPIRATORY_TRACT
  Filled 2021-11-22: qty 3

## 2021-11-22 MED ORDER — IOHEXOL 300 MG/ML  SOLN
100.0000 mL | Freq: Once | INTRAMUSCULAR | Status: AC | PRN
  Administered 2021-11-22: 100 mL via INTRAVENOUS

## 2021-11-22 MED ORDER — FUROSEMIDE 10 MG/ML IJ SOLN
40.0000 mg | Freq: Once | INTRAMUSCULAR | Status: AC
  Administered 2021-11-22: 40 mg via INTRAVENOUS
  Filled 2021-11-22: qty 4

## 2021-11-22 MED ORDER — PANTOPRAZOLE SODIUM 40 MG IV SOLR
40.0000 mg | INTRAVENOUS | Status: DC
  Administered 2021-11-22 – 2021-11-23 (×2): 40 mg via INTRAVENOUS
  Filled 2021-11-22 (×2): qty 10

## 2021-11-22 MED ORDER — ACETAMINOPHEN 325 MG PO TABS
650.0000 mg | ORAL_TABLET | Freq: Once | ORAL | Status: AC
  Administered 2021-11-22: 650 mg via ORAL
  Filled 2021-11-22: qty 2

## 2021-11-22 MED ORDER — ACETAMINOPHEN 325 MG PO TABS
650.0000 mg | ORAL_TABLET | Freq: Four times a day (QID) | ORAL | Status: DC | PRN
  Administered 2021-11-23: 650 mg via ORAL
  Filled 2021-11-22: qty 2

## 2021-11-22 MED ORDER — ENOXAPARIN SODIUM 60 MG/0.6ML IJ SOSY
0.5000 mg/kg | PREFILLED_SYRINGE | INTRAMUSCULAR | Status: DC
  Administered 2021-11-22 – 2021-11-23 (×2): 57.5 mg via SUBCUTANEOUS
  Filled 2021-11-22 (×2): qty 0.6

## 2021-11-22 MED ORDER — FUROSEMIDE 10 MG/ML IJ SOLN
20.0000 mg | Freq: Two times a day (BID) | INTRAMUSCULAR | Status: DC
  Administered 2021-11-23: 20 mg via INTRAVENOUS
  Filled 2021-11-22: qty 4

## 2021-11-22 MED ORDER — FUROSEMIDE 40 MG PO TABS: 40.0000 mg | ORAL_TABLET | Freq: Every day | ORAL | Status: DC

## 2021-11-22 MED ORDER — ALBUTEROL SULFATE (2.5 MG/3ML) 0.083% IN NEBU: 2.5000 mg | INHALATION_SOLUTION | RESPIRATORY_TRACT | Status: DC | PRN

## 2021-11-22 MED ORDER — METOCLOPRAMIDE HCL 5 MG/ML IJ SOLN
10.0000 mg | Freq: Once | INTRAMUSCULAR | Status: AC
  Administered 2021-11-22: 10 mg via INTRAVENOUS
  Filled 2021-11-22: qty 2

## 2021-11-22 MED ORDER — ONDANSETRON HCL 4 MG PO TABS: 4.0000 mg | ORAL_TABLET | Freq: Four times a day (QID) | ORAL | Status: DC | PRN

## 2021-11-22 MED ORDER — ONDANSETRON HCL 4 MG/2ML IJ SOLN: 4.0000 mg | Freq: Four times a day (QID) | INTRAMUSCULAR | Status: DC | PRN

## 2021-11-22 MED ORDER — SODIUM CHLORIDE 0.9 % IV SOLN
500.0000 mg | INTRAVENOUS | Status: DC
  Administered 2021-11-22: 500 mg via INTRAVENOUS
  Filled 2021-11-22: qty 5

## 2021-11-22 MED ORDER — ACETAMINOPHEN 650 MG RE SUPP
650.0000 mg | Freq: Four times a day (QID) | RECTAL | Status: DC | PRN
Start: 2021-11-22 — End: 2021-11-24

## 2021-11-22 NOTE — Assessment & Plan Note (Signed)
Patient became tachypneic to 40 and hypoxic to 86 following fluid administration in the ED ?Differential includes fluid overload with CHF exacerbation ?Possibility of aspiration given prior vomiting, and temperature spike to 103 following tachypnea and hypoxia ?For possible CHF: See management below ?For possible aspiration: Empiric Rocephin and azithromycin and follow procalcitonin ?Supplemental oxygen to keep sats over 94% ?

## 2021-11-22 NOTE — Assessment & Plan Note (Signed)
Complicating factor to overall prognosis and care 

## 2021-11-22 NOTE — Assessment & Plan Note (Signed)
Renal function at baseline ?Monitor renal function given low-dose Lasix ?

## 2021-11-22 NOTE — Assessment & Plan Note (Signed)
No acute issues suspected at this time but will keep on cardiac monitor given suspected fluid overload ?

## 2021-11-22 NOTE — Progress Notes (Signed)
Anticoagulation monitoring(Lovenox): ? ?81 yo  female ordered Lovenox 40 mg Q24h ?   ?Filed Weights  ? 11/22/21 1923  ?Weight: 114.3 kg (252 lb)  ? ?BMI 46  ? ?Lab Results  ?Component Value Date  ? CREATININE 1.02 (H) 11/22/2021  ? CREATININE 1.34 (H) 12/22/2019  ? CREATININE 1.05 (H) 03/30/2017  ? ?Estimated Creatinine Clearance: 52.6 mL/min (A) (by C-G formula based on SCr of 1.02 mg/dL (H)). ?Hemoglobin & Hematocrit  ?   ?Component Value Date/Time  ? HGB 13.7 11/22/2021 1936  ? HGB 11.7 (L) 05/09/2014 1512  ? HCT 43.6 11/22/2021 1936  ? HCT 35.5 05/09/2014 1512  ? ? ? ?Per Protocol for Patient with estCrcl > 30 ml/min and BMI > 30, will transition to Lovenox 57.5  mg Q24h.  ?  ? ? ?

## 2021-11-22 NOTE — ED Notes (Signed)
Pt placed on purewick 

## 2021-11-22 NOTE — Assessment & Plan Note (Signed)
N.p.o. except for ice chips ?IV antiemetics and IV Protonix ?GI panel ?IV hydration with as needed Lasix for possible fluid overload ?

## 2021-11-22 NOTE — Assessment & Plan Note (Addendum)
Possible CHF exacerbation, though low suspicion given administration of only 500 cc fluid bolus, with clear chest x-ray ?Follow BNP, get echo.  Last echo from 2017 with grade 2 DD.  History of cardiomegaly ?We will continue gentle fluid administration in the setting of vomiting and diarrhea ?Low-dose IV Lasix.  Hold Coreg due to soft blood pressures on admission ?Daily weights with intake and output monitoring ? ?

## 2021-11-22 NOTE — Assessment & Plan Note (Signed)
Continue levothyroxine 

## 2021-11-22 NOTE — ED Triage Notes (Addendum)
Pt reports NVD that started today. States she had 6 episodes of vomiting and 4 episodes of diarrhea today. Pt states shes been feeling dizzy the last few days. Denies any syncopal episodes. Zofran given by EMS. Reports stressful weak due to recent loss of family.  ?

## 2021-11-22 NOTE — ED Notes (Signed)
Pt updated on need for urine sample. Pt denies any need to go at this time. Will let me know when they are able to do so.  °

## 2021-11-22 NOTE — ED Provider Notes (Signed)
? ?[facility] ?Provider Note ? ? ? Event Date/Time  ? First MD Initiated Contact with Patient 11/22/21 1922   ?  (approximate) ? ? ?History  ? ?Dizziness (/), Vomiting, and Abdominal Pain ? ? ?HPI ? ?Tamara [name] is a 81 y.o. female history of asthma CHF CKD presents to the ER for evaluation of nausea vomiting for the past 24 to 36 hours.  Did have family who are sick with similar GI illness.  Has had multiple episodes of nonbloody nonmelanotic diarrhea and nonbloody emesis.  Has not been able to keep any of her medications down.  Having some generalized abdominal pain with it as well.  No cough ?  ? ? ?Physical Exam  ? ?Triage Vital Signs: ?ED Triage Vitals  ?Enc Vitals Group  ?   BP 11/22/21 1935 (!) 124/48  ?   Pulse Rate 11/22/21 1932 77  ?   Resp 11/22/21 1932 16  ?   Temp 11/22/21 1932 98 ?F (36.7 ?C)  ?   Temp Source 11/22/21 1932 Oral  ?   SpO2 11/22/21 1932 95 %  ?   Weight 11/22/21 1923 252 lb (114.3 kg)  ?   Height 11/22/21 1923 5\' 2"  (1.575 m)  ?   Head Circumference --   ?   Peak Flow --   ?   Pain Score --   ?   Pain Loc --   ?   Pain Edu? --   ?   Excl. in [place]? --   ? ? ?Most recent vital signs: ?Vitals:  ? 11/22/21 2140 11/22/21 2221  ?BP:    ?Pulse:    ?Resp:    ?Temp:  (!) 103.1 ?F (39.5 ?C)  ?SpO2: 93%   ? ? ? ?Constitutional: Alert ill appearing ?Eyes: Conjunctivae are normal.  ?Head: Atraumatic. ?Nose: No congestion/rhinnorhea. ?Mouth/Throat: Mucous membranes are moist.   ?Neck: Painless ROM.  ?Cardiovascular:   Good peripheral circulation. No m/g/r ?Respiratory: Normal respiratory effort.  Coarse bibasilar bs ?Gastrointestinal: Soft with generalized ttp,no guarding ?Musculoskeletal:  no deformity, 1+ ble edema ?Neurologic:  MAE spontaneously. No gross focal neurologic deficits are appreciated.  ?Skin:  Skin is warm, dry and intact. No rash noted. ?Psychiatric: Mood and affect are normal. Speech and behavior are normal. ? ? ? ?ED Results / Procedures / Treatments   ? ?Labs ?(all labs ordered are listed, but only abnormal results are displayed) ?Labs Reviewed  ?CBC - Abnormal; Notable for the following components:  ?    Result Value  ? WBC 12.4 (*)   ? All other components within normal limits  ?COMPREHENSIVE METABOLIC PANEL - Abnormal; Notable for the following components:  ? Glucose, Bld 160 (*)   ? BUN 27 (*)   ? Creatinine, Ser 1.02 (*)   ? GFR, Estimated 56 (*)   ? All other components within normal limits  ?RESP PANEL BY RT-PCR (FLU A&B, COVID) [id number]  ?CULTURE, BLOOD (ROUTINE X 2)  ?CULTURE, BLOOD (ROUTINE X 2)  ?URINE CULTURE  ?LIPASE, BLOOD  ?LACTIC ACID, PLASMA  ?URINALYSIS, COMPLETE (UACMP) WITH MICROSCOPIC  ?TROPONIN I (HIGH SENSITIVITY)  ? ? ? ?EKG ? ?ED ECG REPORT ?I, Merlyn Lot, the attending physician, personally viewed and interpreted this ECG. ? ? Date: 11/22/2021 ? EKG Time: 19:31 ? Rate: 80 ? Rhythm: sinus ? Axis: left ? Intervals:rbbb ? ST&T Change: no stemi, poor r wave progression ? ? ? ?RADIOLOGY ?Please see ED Course for my review and interpretation. ? ?  I personally reviewed all radiographic images ordered to evaluate for the above acute complaints and reviewed radiology reports and findings.  These findings were personally discussed with the patient.  Please see medical record for radiology report. ? ? ? ?PROCEDURES: ? ?Critical Care performed: Yes, see critical care procedure note(s) ? ?.Critical Care ?Performed by: Merlyn Lot, MD ?Authorized by: Merlyn Lot, MD  ? ?Critical care provider statement:  ?  Critical care time (minutes):  35 ?  Critical care was necessary to treat or prevent imminent or life-threatening deterioration of the following conditions:  Respiratory failure ?  Critical care was time spent personally by me on the following activities:  Ordering and performing treatments and interventions, ordering and review of laboratory studies, ordering and review of radiographic studies, pulse oximetry, re-evaluation of  patient's condition, review of old charts, obtaining history from patient or surrogate, examination of patient, evaluation of patient's response to treatment, discussions with primary provider, discussions with consultants and development of treatment plan with patient or surrogate ? ? ?MEDICATIONS ORDERED IN ED: ?Medications  ?furosemide (LASIX) injection 40 mg (has no administration in time range)  ?ipratropium-albuterol (DUONEB) 0.5-2.5 (3) MG/3ML nebulizer solution 3 mL (has no administration in time range)  ?cefTRIAXone (ROCEPHIN) 2 g in sodium chloride 0.9 % 100 mL IVPB (has no administration in time range)  ?azithromycin (ZITHROMAX) 500 mg in sodium chloride 0.9 % 250 mL IVPB (has no administration in time range)  ?sodium chloride 0.9 % bolus 500 mL (0 mLs Intravenous Stopped 11/22/21 2130)  ?iohexol (OMNIPAQUE) 300 MG/ML solution 100 mL (100 mLs Intravenous Contrast Given 11/22/21 2020)  ?metoCLOPramide (REGLAN) injection 10 mg (10 mg Intravenous Given 11/22/21 2125)  ? ? ? ?IMPRESSION / MDM / ASSESSMENT AND PLAN / ED COURSE  ?I reviewed the triage vital signs and the nursing notes. ?             ?               ? ?Differential diagnosis includes, but is not limited to, enteritis enteritis, dehydration, sepsis, ACS, CHF, COPD, asthma, SBO, diverticulitis ? ?Patient presenting with symptoms as described above.  She is ill-appearing but hemodynamically stable no hypoxia.  CT imaging as well as blood work as well as IV fluids will be ordered given her nausea vomiting diarrhea concern for dehydration. ? ? ?Clinical Course as of 11/22/21 2225  ?Tue Nov 22, 2021  ?2007 Chest x-ray on my review and interpretation does not show any evidence of infiltrate or pneumothorax will await formal radiology report. [PR]  ?2014 Patient does have acute leukocytosis given her history of diverticulitis presenting complaints will order CT imaging to evaluate for above differential. [PR]  ?2100 Patient reassessed.  Feeling improved.   Will p.o. challenge. [PR]  ?2203 Patient found to be hypoxic to 85% on room air placed on 2 L nasal cannula.  Does have some mild cardiomegaly on chest x-ray of that she did not have any overt edema was given IV fluids and may be having worsening edema therefore will give Lasix we will also give nebulizer treatment she does have a history of asthma. ?Increasingly nauseated.  Given her age and clinical presentation given her ill appearance I do believe she is can require observation in the hospital.  Particular in the setting of her acute respiratory failure with hypoxia.  Will discuss with hospitalist for admission [PR]  ?2218 Case discussed in consultation with hospitalist who agreed to admit patient to their service [PR]  ?  2224 Temperature rechecked and she is febrile given her respiratory failure with fever will cover with antibiotics and obtain culture. [PR]  ?  ?Clinical Course User Index ?[PR] Merlyn Lot, MD  ? ? ? ?FINAL CLINICAL IMPRESSION(S) / ED DIAGNOSES  ? ?Final diagnoses:  ?Acute respiratory failure with hypoxia ([place])  ?Nausea vomiting and diarrhea  ? ? ? ?Rx / DC Orders  ? ?ED Discharge Orders   ? ? None  ? ?  ? ? ? ?Note:  This document was prepared using Dragon voice recognition software and may include unintentional dictation errors. ? ?  ?Merlyn Lot, MD ?11/22/21 2225 ? ?

## 2021-11-22 NOTE — Assessment & Plan Note (Signed)
Sliding scale insulin coverage 

## 2021-11-22 NOTE — ED Notes (Addendum)
Pt appears to be 86% on room air. Pt placed on 2 liters nasal cannula. Provider notified  ?

## 2021-11-22 NOTE — H&P (Signed)
?History and Physical  ? ? ?Patient: Tamara [name] DOB: 12-12-1940 ?DOA: 11/22/2021 ?DOS: the patient was seen and examined on 11/22/2021 ?PCP: Lenard Simmer, MD  ?Patient coming from: Home ? ?Chief Complaint:  ?Chief Complaint  ?Patient presents with  ? Dizziness  ?   ?  ? Vomiting  ? Abdominal Pain  ? ? ?HPI: Tamara [name] is a 81 y.o. female with medical history significant for DM, HTN, hypothyroidism, class III obesity, remote history of SVT, CKD stage III, diastolic CHF (G2 DD, EF 60 to 65% 2017), normal LHC 2017, who presents to the ED with a 1 to 2-day history of intractable nausea and vomiting associated with diarrhea.  Family members have a similar illness.  Emesis is nonbloody and nonbilious and diarrhea is nonmelanotic and nonbloody.  She denied fever, chills or shortness of breath. ?ED course: On labs arrival she was afebrile and had soft BP of 124/48 to 108/51.  .  Blood work was significant for leukocytosis of 12,000 with lactic acid 1.3.  Lipase and LFTs WNL.  Creatinine at baseline.  Troponin normal at 5.   ?Patient was fluid resuscitated with a 500 mL bolus of NS, and subsequently became tachypneic to 40 and hypoxic to 86% requiring oxygen.  She was treated with a dose of IV Lasix.  Following this event was when she spiked a temperature to 103.  She was started on Rocephin.  Hospitalist consulted for admission. ? ?CT abdomen and pelvis done from the ED showed the following findings: ? IMPRESSION: ?1. Mild distension and wall thickening of the proximal jejunum, with ?central mesenteric fat stranding. Favor enteritis and mild ileus. No ?evidence of bowel obstruction. ?2. Mild hepatic steatosis. ?3. Distal colonic diverticulosis without diverticulitis. ?4.  Aortic Atherosclerosis (ICD10-I70.0). ? ?Chest x-ray showed ?IMPRESSION: ?Cardiomegaly. There are no new infiltrates or signs of pulmonary ?edema ? ? ?Review of Systems: As mentioned in the history of present illness. All other  systems reviewed and are negative. ?Past Medical History:  ?Diagnosis Date  ? Anemia   ? distant hx of  ? Arthritis   ? KNEES,HANDS,WRIST  ? Asthma   ? allergy related  ? 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 Bronchitis   ? Cardiomegaly   ? CHF (congestive heart failure) ([place])   ? stopped using diuretics due to kidney issues, watching salt intake  ? Chronic cough   ? Chronic kidney disease   ? due to DM  ? Complication of anesthesia   ? BP bottomed out during Achilles Tendon repair >30yrs ago  ? Constipation due to pain medication   ? Diabetes mellitus without complication ([place])   ? 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 Diverticulitis   ? Dysrhythmia   ? Gout   ? HAP (hospital-acquired pneumonia) 05/16/2016  ? Headache   ? Sinus headaches  ? Hepatitis   ? 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  ? History of blood transfusion   ? Hypertension   ? Hypothyroidism   ? 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 feet  ? NSTEMI (non-ST elevated myocardial infarction) ([place])   ? Obesity   ? Palpitations   ? Pneumonia   ? hx of- 05/02/16- last time approx - 6 years ago  ? Sepsis associated hypotension ([place]) 05/16/2016  ? Shortness of breath dyspnea   ? with exertion  ? Sleep apnea   ? does not use CPAP  ?  SVT (supraventricular tachycardia) ([place])   ? Wheezing   ? ?Past Surgical History:  ?Procedure Laterality Date  ? ABDOMINAL HYSTERECTOMY    ? ABDOMINAL HYSTERECTOMY  1984  ? ACHILLES TENDON REPAIR Left   ? CARDIAC CATHETERIZATION    ? [place]  ? CARDIAC CATHETERIZATION N/A 05/18/2016  ? Procedure: Left Heart Cath and Coronary Angiography;  Surgeon: Burnell Blanks, MD;  Location: [facility];  Service: Cardiovascular;  Laterality: N/A;  ? CATARACT EXTRACTION W/PHACO Left 02/10/2015  ? Procedure: CATARACT EXTRACTION PHACO AND INTRAOCULAR LENS PLACEMENT (IOC);  Surgeon: Leandrew Koyanagi, MD;  Location: [facility];  Service: Ophthalmology;  Laterality: Left;  DIABETIC  ? CATARACT EXTRACTION W/PHACO  Right 02/24/2015  ? Procedure: CATARACT EXTRACTION PHACO AND INTRAOCULAR LENS PLACEMENT (IOC);  Surgeon: Leandrew Koyanagi, MD;  Location: [facility];  Service: Ophthalmology;  Laterality: Right;  ? [place]  ? CHOLECYSTECTOMY  1986  ? EYE SURGERY Bilateral   ? Cataract   ? TOTAL KNEE ARTHROPLASTY Right 05/12/2016  ? Procedure: TOTAL KNEE ARTHROPLASTY;  Surgeon: Earlie Server, MD;  Location: [facility];  Service: Orthopedics;  Laterality: Right;  ? TOTAL KNEE ARTHROPLASTY Left 12/29/2016  ? Procedure: TOTAL KNEE ARTHROPLASTY;  Surgeon: Earlie Server, MD;  Location: [facility];  Service: Orthopedics;  Laterality: Left;  ? ?Social History:  reports that she has never smoked. She has quit using smokeless tobacco. She reports that she does not drink alcohol and does not use drugs. ? ?Allergies  ?Allergen Reactions  ? Codeine Nausea And Vomiting  ? ? ?Family History  ?Problem Relation Age of Onset  ? Lung cancer Mother   ? Heart attack Mother   ? Heart Problems Father   ? Hypertension Father   ? Other Father   ?     respiratory problems  ? Ovarian cancer Maternal Grandmother   ? Cancer Maternal Grandfather   ? Stroke Paternal Grandmother   ? Diabetes Brother   ? Liver cancer Brother   ? COPD Sister   ? Other Sister   ?     sepsis  ? Heart attack Son   ? Heart attack Son   ? Rheum arthritis Daughter   ? Rheum arthritis Daughter   ? Thyroid cancer Daughter   ? Hepatitis C Daughter   ? ? ?Prior to Admission medications   ?Medication Sig Start Date End Date Taking? Authorizing Provider  ?acetaminophen (TYLENOL) 500 MG tablet Take 1,000 mg by mouth every 8 (eight) hours as needed for mild pain.   Yes [provider]  ?Biotin w/ Vitamins C & E (HAIR/SKIN/NAILS PO) Take 2 tablets by mouth daily.   Yes [provider]  ?carvedilol (COREG) 3.125 MG tablet Take 3.125 mg by mouth 2 (two) times daily with a meal.   Yes [provider]  ?COMBIVENT RESPIMAT 20-100 MCG/ACT AERS respimat Inhale 1  puff into the lungs every 6 (six) hours as needed. 09/06/21  Yes [provider]  ?furosemide (LASIX) 40 MG tablet Take 40 mg by mouth daily. 09/01/21  Yes [provider]  ?glipiZIDE (GLUCOTROL XL) 2.5 MG 24 hr tablet Take 2.5 mg by mouth daily. 03/13/17  Yes [provider]  ?JANUMET XR [phone] MG TB24 Take 1 tablet by mouth daily. 03/14/17  Yes [provider]  ?JARDIANCE 25 MG TABS tablet Take 25 mg by mouth every morning. 09/13/21  Yes [provider]  ?simvastatin (ZOCOR) 20 MG tablet Take 20 mg by  mouth daily. 09/14/21  Yes [provider]  ?SYNTHROID 75 MCG tablet Take 75 mcg by mouth daily before breakfast. 10/25/21  Yes [provider]  ?Vitamin D, Ergocalciferol, (DRISDOL) 1.25 MG (50000 UNIT) CAPS capsule Take 50,000 Units by mouth once a week. 10/08/21  Yes [provider]  ?VITAMIN E PO Take 450 mg by mouth daily.   Yes [provider]  ?nitroGLYCERIN (NITROSTAT) 0.4 MG SL tablet Place 0.4 mg under the tongue every 5 (five) minutes as needed for chest pain. Max 3    [provider]  ? ? ?Physical Exam: ?Vitals:  ? 11/22/21 2100 11/22/21 2130 11/22/21 2140 11/22/21 2221  ?BP: (!) 108/51 (!) 137/91    ?Pulse: 77 90    ?Resp: (!) 24 (!) 40    ?Temp:    (!) 103.1 ?F (39.5 ?C)  ?TempSrc:    Oral  ?SpO2: 97% (!) 86% 93%   ?Weight:      ?Height:      ? ?Physical Exam ?Vitals and nursing note reviewed.  ?Constitutional:   ?   General: She is not in acute distress. ?   Appearance: She is obese. She is ill-appearing.  ?HENT:  ?   Head: Normocephalic and atraumatic.  ?Cardiovascular:  ?   Rate and Rhythm: Normal rate and regular rhythm.  ?   Pulses: Normal pulses.  ?   Heart sounds: Normal heart sounds.  ?Pulmonary:  ?   Effort: Tachypnea present.  ?   Breath sounds: Normal breath sounds.  ?Abdominal:  ?   Palpations: Abdomen is soft.  ?   Tenderness: There is no abdominal tenderness.  ?Neurological:  ?   General: No focal deficit  present.  ?   Mental Status: Mental status is at baseline.  ? ? ? ?Data Reviewed: ?Relevant notes from primary care and specialist visits, past discharge summaries as available in EHR, including Care E

## 2021-11-22 NOTE — ED Notes (Addendum)
This RN entered pt food to encourage food and fluids. Pt states she feels very nauseous. Provider is notified  ?

## 2021-11-23 ENCOUNTER — Encounter: Payer: Self-pay | Admitting: Internal Medicine

## 2021-11-23 ENCOUNTER — Inpatient Hospital Stay (HOSPITAL_COMMUNITY)
Admit: 2021-11-23 | Discharge: 2021-11-23 | Disposition: A | Discharge status: Home / Self Care | Payer: Medicare Other | Attending: Internal Medicine | Admitting: Internal Medicine

## 2021-11-23 ENCOUNTER — Inpatient Hospital Stay: Payer: Medicare Other

## 2021-11-23 DIAGNOSIS — I5031 Acute diastolic (congestive) heart failure: Secondary | ICD-10-CM

## 2021-11-23 DIAGNOSIS — R112 Nausea with vomiting, unspecified: Secondary | ICD-10-CM | POA: Diagnosis not present

## 2021-11-23 DIAGNOSIS — I959 Hypotension, unspecified: Secondary | ICD-10-CM

## 2021-11-23 DIAGNOSIS — K529 Noninfective gastroenteritis and colitis, unspecified: Secondary | ICD-10-CM | POA: Diagnosis not present

## 2021-11-23 DIAGNOSIS — A419 Sepsis, unspecified organism: Secondary | ICD-10-CM

## 2021-11-23 DIAGNOSIS — J9601 Acute respiratory failure with hypoxia: Secondary | ICD-10-CM | POA: Diagnosis not present

## 2021-11-23 DIAGNOSIS — T17908A Unspecified foreign body in respiratory tract, part unspecified causing other injury, initial encounter: Secondary | ICD-10-CM

## 2021-11-23 LAB — ECHOCARDIOGRAM COMPLETE
AR max vel: 2.03 cm2
AV Area VTI: 2.38 cm2
AV Area mean vel: 2.06 cm2
AV Mean grad: 4.5 mmHg
AV Peak grad: 8.8 mmHg
Ao pk vel: 1.49 m/s
Area-P 1/2: 2.51 cm2
Height: 62 in
MV VTI: 1.78 cm2
S' Lateral: 2.7 cm
Weight: 4032 oz

## 2021-11-23 LAB — URINALYSIS, COMPLETE (UACMP) WITH MICROSCOPIC
Bilirubin Urine: NEGATIVE
Glucose, UA: >=500 mg/dL — AB
Hgb urine dipstick: NEGATIVE
Ketones, ur: 5 mg/dL — AB
Leukocytes,Ua: NEGATIVE
Nitrite: NEGATIVE
Protein, ur: NEGATIVE mg/dL
Specific Gravity, Urine: 1.028 (ref 1.005–1.030)
pH: 5 (ref 5.0–8.0)

## 2021-11-23 LAB — CBG MONITORING, ED
Glucose-Capillary: 145 mg/dL — ABNORMAL HIGH (ref 70–99)
Glucose-Capillary: 120 mg/dL — ABNORMAL HIGH (ref 70–99)
Glucose-Capillary: 144 mg/dL — ABNORMAL HIGH (ref 70–99)

## 2021-11-23 LAB — BASIC METABOLIC PANEL
Anion gap: 8 (ref 5–15)
BUN: 29 mg/dL — ABNORMAL HIGH (ref 8–23)
CO2: 21 mmol/L — ABNORMAL LOW (ref 22–32)
Calcium: 6.9 mg/dL — ABNORMAL LOW (ref 8.9–10.3)
Chloride: 111 mmol/L (ref 98–111)
Creatinine, Ser: 1.27 mg/dL — ABNORMAL HIGH (ref 0.44–1.00)
GFR, Estimated: 43 mL/min — ABNORMAL LOW (ref >60)
Glucose, Bld: 150 mg/dL — ABNORMAL HIGH (ref 70–99)
Potassium: 3 mmol/L — ABNORMAL LOW (ref 3.5–5.1)
Sodium: 140 mmol/L (ref 135–145)

## 2021-11-23 LAB — GLUCOSE, CAPILLARY
Glucose-Capillary: 121 mg/dL — ABNORMAL HIGH (ref 70–99)
Glucose-Capillary: 143 mg/dL — ABNORMAL HIGH (ref 70–99)

## 2021-11-23 LAB — CBC
HCT: 37 % (ref 36.0–46.0)
Hemoglobin: 11.3 g/dL — ABNORMAL LOW (ref 12.0–15.0)
MCH: 28.1 pg (ref 26.0–34.0)
MCHC: 30.5 g/dL (ref 30.0–36.0)
MCV: 92 fL (ref 80.0–100.0)
Platelets: 245 10*3/uL (ref 150–400)
RBC: 4.02 MIL/uL (ref 3.87–5.11)
RDW: 14.8 % (ref 11.5–15.5)
WBC: 14.5 10*3/uL — ABNORMAL HIGH (ref 4.0–10.5)
nRBC: 0 % (ref 0.0–0.2)

## 2021-11-23 LAB — PHOSPHORUS: Phosphorus: 2.1 mg/dL — ABNORMAL LOW (ref 2.5–4.6)

## 2021-11-23 LAB — MAGNESIUM: Magnesium: 1.6 mg/dL — ABNORMAL LOW (ref 1.7–2.4)

## 2021-11-23 LAB — TROPONIN I (HIGH SENSITIVITY): Troponin I (High Sensitivity): 51 ng/L — ABNORMAL HIGH (ref <18)

## 2021-11-23 LAB — HEMOGLOBIN A1C
Hgb A1c MFr Bld: 6.8 % — ABNORMAL HIGH (ref 4.8–5.6)
Mean Plasma Glucose: 148.46 mg/dL

## 2021-11-23 MED ORDER — SODIUM CHLORIDE 0.9 % IV BOLUS
500.0000 mL | Freq: Once | INTRAVENOUS | Status: AC
  Administered 2021-11-23: 500 mL via INTRAVENOUS

## 2021-11-23 MED ORDER — POTASSIUM CHLORIDE CRYS ER 20 MEQ PO TBCR
40.0000 meq | EXTENDED_RELEASE_TABLET | ORAL | Status: AC
  Administered 2021-11-23 (×2): 40 meq via ORAL
  Filled 2021-11-23 (×2): qty 2

## 2021-11-23 MED ORDER — POTASSIUM CHLORIDE IN NACL 20-0.9 MEQ/L-% IV SOLN
INTRAVENOUS | Status: AC
Start: 2021-11-23 — End: 2021-11-23
  Filled 2021-11-23: qty 1000

## 2021-11-23 MED ORDER — MAGNESIUM SULFATE 2 GM/50ML IV SOLN
2.0000 g | Freq: Once | INTRAVENOUS | Status: AC
  Administered 2021-11-23: 2 g via INTRAVENOUS
  Filled 2021-11-23: qty 50

## 2021-11-23 NOTE — ED Notes (Addendum)
Pts blood pressure appears to be trending down. Pt asymptomatic at this time. Katy Foust notified at this time.  ?

## 2021-11-23 NOTE — Progress Notes (Signed)
Admission profile updated ? ?Echo at bedside. ?

## 2021-11-23 NOTE — ED Notes (Signed)
Manual blood pressure obtained. Katie Foust notified. fluid bolus will be given  ?

## 2021-11-23 NOTE — ED Notes (Signed)
Informed RN bed assigned 

## 2021-11-23 NOTE — Progress Notes (Addendum)
? ? ? ?Progress Note  ? ? ?Tamara [name]  [id number] DOB: 1941/03/30  DOA: 11/22/2021 ?PCP: Lenard Simmer, MD  ? ? ? ? ?Brief Narrative:  ? ? ?Medical records reviewed and are as summarized below: ? ?Tamara [name] is a 81 y.o. female with medical history significant for DM, HTN, hypothyroidism, class III obesity, remote history of SVT, CKD stage IIIa, chronic diastolic CHF (G2 DD, EF 60 to 65% 2017), normal LHC 2017, who presented to the hospital with nausea, vomiting and diarrhea.  She said she had about 6 episodes of vomiting and watery stools.  She said she had eating some kind of mcmuffin at [employer].  She was febrile, tachypneic and hypotensive in the emergency room.  CT abdomen and pelvis showed mild distention and wall thickening of proximal jejunal with findings concerning for enteritis and mild ileus.  There was no evidence of bowel obstruction. ? ?She was admitted to the hospital for sepsis secondary to acute gastroenteritis. ? ? ? ? ? ? ? ? ?Assessment/Plan:  ? ?Principal Problem: ?  Sepsis associated hypotension ([place]) ?Active Problems: ?  Acute gastroenteritis ?  Acute respiratory failure with hypoxia ([place]) ?  Possible Aspiration into respiratory tract ?  Chronic diastolic CHF (congestive heart failure) ([place]) ?  Hypothyroidism ?  Diabetes mellitus ([place]) ?  Obesity, Class III, BMI 40-49.9 (morbid obesity) ([place]) ?  History of supraventricular tachycardia ?  Chronic kidney disease, stage 3a ([place]) ? ? ? ?Body mass index is 46.09 kg/m?.  (Morbid obesity) ? ?Sepsis secondary to acute gastroenteritis: Continue IV fluids.  Follow-up blood cultures.  Repeat chest x-ray did not show any evidence of pneumonia.  Discontinue IV ceftriaxone and Flagyl.  Advance diet as tolerated. ? ?Hypotension: Restart IV fluids and monitor BP closely. ? ?Acute hypoxic respiratory failure: She is on 2 L/min oxygen.  Taper off oxygen as able. ? ?Chronic diastolic CHF: No evidence of acute CHF at this time.   Continue Lasix. ? ?Other comorbidities include CKD stage IIIa, hypothyroidism, type II DM, history of SVT ? ? ? ?Diet Order   ? ?       ?  Diet Heart Room service appropriate? Yes; Fluid consistency: Thin  Diet effective now       ?  ? ?  ?  ? ?  ? ? ? ? ? ? ? ? ?Consultants: ?None ? ?Procedures: ?None ? ? ? ?Medications:  ? ? enoxaparin (LOVENOX) injection  0.5 mg/kg Subcutaneous Q24H  ? insulin aspart  0-20 Units Subcutaneous Q4H  ? pantoprazole (PROTONIX) IV  40 mg Intravenous Q24H  ? simvastatin  20 mg Oral Daily  ? ?Continuous Infusions: ? 0.9 % NaCl with KCl 20 mEq / L 75 mL/hr at 11/23/21 1118  ? ? ? ?Anti-infectives (From admission, onward)  ? ? Start     Dose/Rate Route Frequency Ordered Stop  ? 11/22/21 2230  cefTRIAXone (ROCEPHIN) 2 g in sodium chloride 0.9 % 100 mL IVPB  Status:  Discontinued       ? 2 g ?200 mL/hr over 30 Minutes Intravenous Every 24 hours 11/22/21 2224 11/23/21 1351  ? 11/22/21 2230  azithromycin (ZITHROMAX) 500 mg in sodium chloride 0.9 % 250 mL IVPB  Status:  Discontinued       ? 500 mg ?250 mL/hr over 60 Minutes Intravenous Every 24 hours 11/22/21 2224 11/23/21 1351  ? ?  ? ? ? ? ? ? ? ? ? ?Family Communication/Anticipated D/C date and  plan/Code Status  ? ?DVT prophylaxis:  ? ? ?  Code Status: Full Code ? ?Family Communication: Plan discussed with Melissa, daughter, at the bedside ?Disposition Plan: To discharge home in 1 to 2 days ? ? ?Status is: Inpatient ?Remains inpatient appropriate because: IV fluids and follow-up blood culture ? ? ? ? ? ? ?Subjective:  ? ?Interval events noted.  No nausea, vomiting or diarrhea today.  No chest pain or shortness of breath but she has an occasional cough. ? ?Objective:  ? ? ?Vitals:  ? 11/23/21 1200 11/23/21 1230 11/23/21 1300 11/23/21 1330  ?BP: (!) 91/53 (!) 98/55 109/65 (!) 114/54  ?Pulse: 78 66 66 69  ?Resp:    16  ?Temp:      ?TempSrc:      ?SpO2: 99% 98% 100% 99%  ?Weight:      ?Height:      ? ?No data found. ? ? ?Intake/Output Summary  (Last 24 hours) at 11/23/2021 1354 ?Last data filed at 11/23/2021 1210 ?Gross per 24 hour  ?Intake 2655.52 ml  ?Output --  ?Net 2655.52 ml  ? ?Filed Weights  ? 11/22/21 1923  ?Weight: 114.3 kg  ? ? ?Exam: ? ?GEN: NAD ?SKIN: Warm and dry ?EYES: EOMI ?ENT: MMM ?CV: RRR ?PULM: No wheezing or rales. ?ABD: soft, obese, NT, +BS ?CNS: AAO x 3, non focal ?EXT: Trace bilateral leg edema, no tenderness ? ? ? ?  ? ? ?Data Reviewed:  ? ?I have personally reviewed following labs and imaging studies: ? ?Labs: ?Labs show the following:  ? ?Basic Metabolic Panel: ?Recent Labs  ?Lab 11/22/21 ?1936 11/23/21 ?0414  ?NA 140 140  ?K 4.0 3.0*  ?CL 105 111  ?CO2 26 21*  ?GLUCOSE 160* 150*  ?BUN 27* 29*  ?CREATININE 1.02* 1.27*  ?CALCIUM 8.9 6.9*  ?MG  --  1.6*  ?PHOS  --  2.1*  ? ?GFR ?Estimated Creatinine Clearance: 42.3 mL/min (A) (by C-G formula based on SCr of 1.27 mg/dL (H)). ?Liver Function Tests: ?Recent Labs  ?Lab 11/22/21 ?1936  ?AST 30  ?ALT 22  ?ALKPHOS 55  ?BILITOT 1.0  ?PROT 8.0  ?ALBUMIN 3.6  ? ?Recent Labs  ?Lab 11/22/21 ?1936  ?LIPASE 42  ? ?No results for input(s): AMMONIA in the last 168 hours. ?Coagulation profile ?No results for input(s): INR, PROTIME in the last 168 hours. ? ?CBC: ?Recent Labs  ?Lab 11/22/21 ?1936 11/23/21 ?0414  ?WBC 12.4* 14.5*  ?HGB 13.7 11.3*  ?HCT 43.6 37.0  ?MCV 90.6 92.0  ?PLT 282 245  ? ?Cardiac Enzymes: ?No results for input(s): CKTOTAL, CKMB, CKMBINDEX, TROPONINI in the last 168 hours. ?BNP (last 3 results) ?No results for input(s): PROBNP in the last 8760 hours. ?CBG: ?Recent Labs  ?Lab 11/22/21 ?2329 11/23/21 ?0423 11/23/21 ?[id number] 11/23/21 ?1203  ?GLUCAP 145* 145* 120* 144*  ? ?D-Dimer: ?No results for input(s): DDIMER in the last 72 hours. ?Hgb A1c: ?Recent Labs  ?  11/23/21 ?0414  ?HGBA1C 6.8*  ? ?Lipid Profile: ?No results for input(s): CHOL, HDL, LDLCALC, TRIG, CHOLHDL, LDLDIRECT in the last 72 hours. ?Thyroid function studies: ?No results for input(s): TSH, T4TOTAL, T3FREE, THYROIDAB in  the last 72 hours. ? ?Invalid input(s): FREET3 ?Anemia work up: ?No results for input(s): VITAMINB12, FOLATE, FERRITIN, TIBC, IRON, RETICCTPCT in the last 72 hours. ?Sepsis Labs: ?Recent Labs  ?Lab 11/22/21 ?1936 11/23/21 ?0414  ?WBC 12.4* 14.5*  ?LATICACIDVEN 1.7  --   ? ? ?Microbiology ?Recent Results (from the past 240 hour(s))  ?  Resp Panel by RT-PCR (Flu A&B, Covid) Nasopharyngeal Swab     Status: None  ? Collection Time: 11/22/21 10:14 PM  ? Specimen: Nasopharyngeal Swab; Nasopharyngeal(NP) swabs in vial transport medium  ?Result Value Ref Range Status  ? SARS Coronavirus 2 by RT PCR NEGATIVE NEGATIVE Final  ?  Comment: (NOTE) ?SARS-CoV-2 target nucleic acids are NOT DETECTED. ? ?The SARS-CoV-2 RNA is generally detectable in upper respiratory ?specimens during the acute phase of infection. The lowest ?concentration of SARS-CoV-2 viral copies this assay can detect is ?138 copies/mL. A negative result does not preclude SARS-Cov-2 ?infection and should not be used as the sole basis for treatment or ?other patient management decisions. A negative result may occur with  ?improper specimen collection/handling, submission of specimen other ?than nasopharyngeal swab, presence of viral mutation(s) within the ?areas targeted by this assay, and inadequate number of viral ?copies(<138 copies/mL). A negative result must be combined with ?clinical observations, patient history, and epidemiological ?information. The expected result is Negative. ? ?Fact Sheet for Patients:  ?EntrepreneurPulse.com.au ? ?Fact Sheet for Healthcare Providers:  ?IncredibleEmployment.be ? ?This test is no t yet approved or cleared by the [place] FDA and  ?has been authorized for detection and/or diagnosis of SARS-CoV-2 by ?FDA under an Emergency Use Authorization (EUA). This EUA will remain  ?in effect (meaning this test can be used) for the duration of the ?COVID-19 declaration under Section 564(b)(1) of the  Act, 21 ?U.S.C.section 360bbb-3(b)(1), unless the authorization is terminated  ?or revoked sooner.  ? ? ?  ? Influenza A by PCR NEGATIVE NEGATIVE Final  ? Influenza B by PCR NEGATIVE NEGATIVE Final  ?  Comme

## 2021-11-23 NOTE — ED Notes (Addendum)
Pt resting in bed. Appears to be sleeping at this time. Chest is rising and falling symmetrically. No acute distress noted.  ?

## 2021-11-23 NOTE — Progress Notes (Signed)
*  PRELIMINARY RESULTS* ?Echocardiogram ?2D Echocardiogram has been performed. ? ?Neetu Carrozza, Dorene Sorrow ?11/23/2021, 8:38 AM ?

## 2021-11-23 NOTE — Assessment & Plan Note (Signed)
Empiric Rocephin and azithromycin and follow procalcitonin ?Supplemental oxygen to keep sats over 94% ? ?

## 2021-11-24 DIAGNOSIS — R112 Nausea with vomiting, unspecified: Secondary | ICD-10-CM | POA: Diagnosis not present

## 2021-11-24 DIAGNOSIS — I959 Hypotension, unspecified: Secondary | ICD-10-CM | POA: Diagnosis not present

## 2021-11-24 DIAGNOSIS — J9601 Acute respiratory failure with hypoxia: Secondary | ICD-10-CM | POA: Diagnosis not present

## 2021-11-24 DIAGNOSIS — K529 Noninfective gastroenteritis and colitis, unspecified: Secondary | ICD-10-CM | POA: Diagnosis not present

## 2021-11-24 DIAGNOSIS — A419 Sepsis, unspecified organism: Secondary | ICD-10-CM | POA: Diagnosis not present

## 2021-11-24 LAB — CBC WITH DIFFERENTIAL/PLATELET
Abs Immature Granulocytes: 0.03 10*3/uL (ref 0.00–0.07)
Basophils Absolute: 0 10*3/uL (ref 0.0–0.1)
Basophils Relative: 0 %
Eosinophils Absolute: 0 10*3/uL (ref 0.0–0.5)
Eosinophils Relative: 0 %
HCT: 38 % (ref 36.0–46.0)
Hemoglobin: 11.6 g/dL — ABNORMAL LOW (ref 12.0–15.0)
Immature Granulocytes: 1 %
Lymphocytes Relative: 21 %
Lymphs Abs: 1.4 10*3/uL (ref 0.7–4.0)
MCH: 28 pg (ref 26.0–34.0)
MCHC: 30.5 g/dL (ref 30.0–36.0)
MCV: 91.6 fL (ref 80.0–100.0)
Monocytes Absolute: 0.7 10*3/uL (ref 0.1–1.0)
Monocytes Relative: 11 %
Neutro Abs: 4.5 10*3/uL (ref 1.7–7.7)
Neutrophils Relative %: 67 %
Platelets: 217 10*3/uL (ref 150–400)
RBC: 4.15 MIL/uL (ref 3.87–5.11)
RDW: 15.2 % (ref 11.5–15.5)
WBC: 6.6 10*3/uL (ref 4.0–10.5)
nRBC: 0 % (ref 0.0–0.2)

## 2021-11-24 LAB — BASIC METABOLIC PANEL
Anion gap: 5 (ref 5–15)
BUN: 25 mg/dL — ABNORMAL HIGH (ref 8–23)
CO2: 23 mmol/L (ref 22–32)
Calcium: 7.9 mg/dL — ABNORMAL LOW (ref 8.9–10.3)
Chloride: 106 mmol/L (ref 98–111)
Creatinine, Ser: 1.18 mg/dL — ABNORMAL HIGH (ref 0.44–1.00)
GFR, Estimated: 47 mL/min — ABNORMAL LOW (ref >60)
Glucose, Bld: 138 mg/dL — ABNORMAL HIGH (ref 70–99)
Potassium: 4.3 mmol/L (ref 3.5–5.1)
Sodium: 134 mmol/L — ABNORMAL LOW (ref 135–145)

## 2021-11-24 LAB — GLUCOSE, CAPILLARY
Glucose-Capillary: 148 mg/dL — ABNORMAL HIGH (ref 70–99)
Glucose-Capillary: 108 mg/dL — ABNORMAL HIGH (ref 70–99)
Glucose-Capillary: 138 mg/dL — ABNORMAL HIGH (ref 70–99)
Glucose-Capillary: 116 mg/dL — ABNORMAL HIGH (ref 70–99)

## 2021-11-24 LAB — URINE CULTURE: Culture: NO GROWTH

## 2021-11-24 LAB — MAGNESIUM: Magnesium: 2.4 mg/dL (ref 1.7–2.4)

## 2021-11-24 NOTE — Discharge Summary (Signed)
? ?Physician Discharge Summary  ?Tamara [name] [id number] DOB: 11/20/1940 DOA: 11/22/2021 ? ?PCP: Lenard Simmer, MD ? ?Admit date: 11/22/2021 ?Discharge date: 11/24/2021 ? ?Discharge disposition: Home ? ? ?Recommendations for Outpatient Follow-Up:  ? ?Follow-up with PCP in 1 week. ?Outpatient follow-up with pulmonologist for sleep study recommended. ? ? ?Discharge Diagnosis:  ? ?Principal Problem: ?  Sepsis associated hypotension ([place]) ?Active Problems: ?  Acute gastroenteritis ?  Acute respiratory failure with hypoxia ([place]) ?  Possible Aspiration into respiratory tract ?  Chronic diastolic CHF (congestive heart failure) ([place]) ?  Hypothyroidism ?  Diabetes mellitus ([place]) ?  Obesity, Class III, BMI 40-49.9 (morbid obesity) ([place]) ?  History of supraventricular tachycardia ?  Chronic kidney disease, stage 3a ([place]) ? ? ? ?Discharge Condition: Stable. ? ?Diet recommendation:  ?Diet Order   ? ?       ?  Diet - low sodium heart healthy       ?  ?  Diet Carb Modified       ?  ?  Diet Heart Room service appropriate? Yes; Fluid consistency: Thin  Diet effective now       ?  ? ?  ?  ? ?  ? ? ?  Code Status: Full Code ? ? ? ? ?Hospital Course:  ? ?Tamara [name] is a 81 y.o. female with medical history significant for DM, HTN, hypothyroidism, class III obesity, remote history of SVT, CKD stage IIIa, chronic diastolic CHF (G2 DD, EF 60 to 65% 2017), normal LHC 2017, who presented to the hospital with nausea, vomiting and diarrhea.  She said she had about 6 episodes of vomiting and watery stools.  She said she had eating some kind of mcmuffin at [employer].  She was febrile, tachypneic and hypotensive in the emergency room.  CT abdomen and pelvis showed mild distention and wall thickening of proximal jejunal with findings concerning for enteritis and mild ileus.  There was no evidence of bowel obstruction. ? ?She was admitted to the hospital for sepsis associated with hypotension secondary to acute  gastroenteritis.  She was treated with IV fluids and empiric IV antibiotics.  However, IV antibiotics were discontinued because gastroenteritis was thought to be viral in origin.  She had acute hypoxic respiratory failure that was treated with 2 L/min oxygen via nasal cannula.  Her symptoms improved and acute hypoxia has resolved.  Of note, she was noted to have oxygen desaturation while sleeping.  She said she has a history of obstructive sleep apnea and she had used CPAP machine several years ago, sometime in 2003.  However, she stopped using the CPAP because she could not tolerate the mask.  Outpatient follow-up for sleep study and CPAP prescription was recommended.  We went over the benefits of treating OSA with CPAP and the increased risk of mortality associated with untreated OSA.  ? ? ? ? ? ?Discharge Exam:  ? ? ?Vitals:  ? 11/24/21 0339 11/24/21 0834 11/24/21 0900 11/24/21 1300  ?BP:  (!) 132/51    ?Pulse:  69    ?Resp:  20    ?Temp:  98.1 ?F (36.7 ?C)    ?TempSrc:      ?SpO2:  98% 95% 96%  ?Weight: 121 kg     ?Height:      ? ? ? ?GEN: NAD ?SKIN: No rash on exposed skin ?EYES: EOMI ?ENT: MMM ?CV: RRR ?PULM: CTA B ?ABD: soft, obese, NT, +BS ?CNS: AAO x 3, non focal ?EXT:  No edema or tenderness ? ? ?The results of significant diagnostics from this hospitalization (including imaging, microbiology, ancillary and laboratory) are listed below for reference.   ? ? ?Procedures and Diagnostic Studies:  ? ?DG Chest Port 1 View ? ?Result Date: 11/23/2021 ?CLINICAL DATA:  Pneumonia. EXAM: PORTABLE CHEST 1 VIEW COMPARISON:  November 22, 2021. FINDINGS: Stable cardiomediastinal silhouette. Both lungs are clear. The visualized skeletal structures are unremarkable. IMPRESSION: No active disease. Electronically Signed   By: Marijo Conception M.D.   On: 11/23/2021 10:35  ? ?ECHOCARDIOGRAM COMPLETE ? ?Result Date: 11/23/2021 ?   ECHOCARDIOGRAM REPORT   Patient Name:   Tamara [name] Date of Exam: 11/23/2021 Medical Rec #:  [id number]       Height:       62.0 in Accession #:    [id number]     Weight:       252.0 lb Date of Birth:  01/28/1941      BSA:          2.109 m? Patient Age:    21 years       BP:           105/48 mmHg Patient Gender: F              HR:           63 bpm. Exam Location:  [facility] Procedure: 2D Echo, Color Doppler and Cardiac Doppler Indications:     CHF-acute diastolic [id number]  History:         Patient has prior history of Echocardiogram examinations, most                  recent 05/17/2016. CHF; Risk Factors:Diabetes and Hypertension.  Sonographer:     Sherrie Sport Referring Phys:  [id number] Athena Masse Diagnosing Phys: Kathlyn Sacramento MD IMPRESSIONS  1. Left ventricular ejection fraction, by estimation, is 60 to 65%. The left ventricle has normal function. The left ventricle has no regional wall motion abnormalities. Left ventricular diastolic parameters are consistent with Grade I diastolic dysfunction (impaired relaxation).  2. Right ventricular systolic function is normal. The right ventricular size is normal. There is normal pulmonary artery systolic pressure.  3. Left atrial size was mildly dilated.  4. Right atrial size was mildly dilated.  5. The mitral valve is normal in structure. No evidence of mitral valve regurgitation. No evidence of mitral stenosis.  6. The aortic valve is normal in structure. Aortic valve regurgitation is not visualized. No aortic stenosis is present.  7. The inferior vena cava is normal in size with greater than 50% respiratory variability, suggesting right atrial pressure of 3 mmHg. FINDINGS  Left Ventricle: Left ventricular ejection fraction, by estimation, is 60 to 65%. The left ventricle has normal function. The left ventricle has no regional wall motion abnormalities. The left ventricular internal cavity size was normal in size. There is  no left ventricular hypertrophy. Left ventricular diastolic parameters are consistent with Grade I diastolic dysfunction (impaired relaxation). Right Ventricle:  The right ventricular size is normal. No increase in right ventricular wall thickness. Right ventricular systolic function is normal. There is normal pulmonary artery systolic pressure. The tricuspid regurgitant velocity is 1.83 m/s, and  with an assumed right atrial pressure of 3 mmHg, the estimated right ventricular systolic pressure is [id number] mmHg. Left Atrium: Left atrial size was mildly dilated. Right Atrium: Right atrial size was mildly dilated. Pericardium: There is no evidence of pericardial effusion. Mitral Valve: The mitral valve  is normal in structure. No evidence of mitral valve regurgitation. No evidence of mitral valve stenosis. MV peak gradient, 4.8 mmHg. The mean mitral valve gradient is 2.0 mmHg. Tricuspid Valve: The tricuspid valve is normal in structure. Tricuspid valve regurgitation is not demonstrated. No evidence of tricuspid stenosis. Aortic Valve: The aortic valve is normal in structure. Aortic valve regurgitation is not visualized. No aortic stenosis is present. Aortic valve mean gradient measures 4.5 mmHg. Aortic valve peak gradient measures 8.8 mmHg. Aortic valve area, by VTI measures 2.38 cm?. Pulmonic Valve: The pulmonic valve was normal in structure. Pulmonic valve regurgitation is not visualized. No evidence of pulmonic stenosis. Aorta: The aortic root is normal in size and structure. Venous: The inferior vena cava is normal in size with greater than 50% respiratory variability, suggesting right atrial pressure of 3 mmHg. IAS/Shunts: No atrial level shunt detected by color flow Doppler.  LEFT VENTRICLE PLAX 2D LVIDd:         4.00 cm   Diastology LVIDs:         2.70 cm   LV e' medial:    5.11 cm/s LV PW:         1.00 cm   LV E/e' medial:  16.4 LV IVS:        0.95 cm   LV e' lateral:   6.31 cm/s LVOT diam:     2.00 cm   LV E/e' lateral: 13.3 LV SV:         66 LV SV Index:   31 LVOT Area:     3.14 cm?  RIGHT VENTRICLE RV Basal diam:  3.20 cm RV S prime:     14.30 cm/s TAPSE (M-mode): 2.7 cm  LEFT ATRIUM             Index        RIGHT ATRIUM           Index LA diam:        3.30 cm 1.56 cm/m?   RA Area:     23.80 cm? LA Vol (A2C):   87.9 ml 41.68 ml/m?  RA Volume:   72.90 ml  34.57 ml/m? LA Vol (A4C):

## 2021-11-24 NOTE — TOC Initial Note (Signed)
Transition of Care (TOC) - Initial/Assessment Note  ? ? ?Patient Details  ?Name: Tamara [name] ?MRN: [id number] ?Date of Birth: [birth year] ? ?Transition of Care (TOC) CM/SW Contact:    ?Laurena Slimmer, RN ?Phone Number: ?11/24/2021, 2:12 PM ? ?Clinical Narrative:                 ?Patient admitted for sepsis related to gastroenteritis. Patient from home where she lives independently. Is able to drive her self to appointments. No recommendations for home health or PT. Daughter will be able to transport at discharge.  ? ?Admitted for: Sepsis related to gastroenteritis ?Admitted from:Home ?PCP:Dr. Lavell Islam ?Pharmacy:[facility] [place] ?Current home health/prior home health/DME: No active service/ Walker, shower chair and BSC.  ? ?Expected Discharge Plan: Home/Self Care ?Barriers to Discharge: Barriers Resolved ? ? ?Patient Goals and CMS Choice ?Patient states their goals for this hospitalization and ongoing recovery are:: To return home ?  ?  ? ?Expected Discharge Plan and Services ?Expected Discharge Plan: Home/Self Care ?  ?  ?  ?Living arrangements for the past 2 months: [place] ?Expected Discharge Date: 11/24/21               ?  ?  ?  ?  ?  ?  ?  ?  ?  ?  ? ?Prior Living Arrangements/Services ?Living arrangements for the past 2 months: [place] ?Lives with:: Self ?Patient language and need for interpreter reviewed:: Yes ?Do you feel safe going back to the place where you live?: Yes      ?Need for Family Participation in Patient Care: No (Comment) ?Care giver support system in place?: Yes (comment) ?  ?  ? ?Activities of Daily Living ?Home Assistive Devices/Equipment: Cane (specify quad or straight), Eyeglasses, Shower chair without back ?ADL Screening (condition at time of admission) ?Patient's cognitive ability adequate to safely complete daily activities?: Yes ?Is the patient deaf or have difficulty hearing?: No ?Does the patient have difficulty seeing, even when wearing glasses/contacts?:  No ?Does the patient have difficulty concentrating, remembering, or making decisions?: No ?Patient able to express need for assistance with ADLs?: Yes ?Does the patient have difficulty dressing or bathing?: No ?Independently performs ADLs?: Yes (appropriate for developmental age) ?Does the patient have difficulty walking or climbing stairs?: Yes ?Weakness of Legs: None ?Weakness of Arms/Hands: None ? ?Permission Sought/Granted ?Permission sought to share information with : Case Manager ?  ?   ?   ?   ?   ? ?Emotional Assessment ?Appearance:: Appears stated age ?Attitude/Demeanor/Rapport: Engaged ?Affect (typically observed): Pleasant ?Orientation: : Oriented to Self, Oriented to Place, Oriented to  Time, Oriented to Situation ?Alcohol / Substance Use: Never Used ?Psych Involvement: No (comment) ? ?Admission diagnosis:  Acute gastroenteritis [K52.9] ?Acute respiratory failure with hypoxia ([place]) [J96.01] ?Nausea vomiting and diarrhea [R11.2, R19.7] ?Patient Active Problem List  ? Diagnosis Date Noted  ? Possible Aspiration into respiratory tract 11/23/2021  ? Acute gastroenteritis 11/22/2021  ? Chronic diastolic CHF (congestive heart failure) ([place]) 11/22/2021  ? History of supraventricular tachycardia 11/22/2021  ? Chronic kidney disease, stage 3a ([place]) 11/22/2021  ? Obesity, Class III, BMI 40-49.9 (morbid obesity) ([place]) 12/30/2016  ? Primary localized osteoarthritis of left knee 12/29/2016  ? Hypothyroidism 12/29/2016  ? Diabetes mellitus ([place]) 12/29/2016  ? CHF with left ventricular diastolic dysfunction, NYHA class 2 ([place]) 12/29/2016  ? Yeast infection of the skin 12/29/2016  ? Hypertension 12/29/2016  ? Sepsis associated hypotension ([place]) 05/16/2016  ?  Acute respiratory failure with hypoxia ([place]) 05/16/2016  ? Primary localized osteoarthritis of right knee 05/12/2016  ? Paroxysmal SVT (supraventricular tachycardia) ([place]) 03/01/2016  ? Preprocedural cardiovascular examination 03/01/2016  ? ?PCP:  Lenard Simmer, MD ?Pharmacy:   ?[facility] [address] ?[place] [address] ?Phone: [phone] Fax: [phone] ? ? ? ? ?Social Determinants of Health (SDOH) Interventions ?  ? ?Readmission Risk Interventions ?   ? View : No data to display.  ?  ?  ?  ? ? ? ?

## 2021-11-24 NOTE — Progress Notes (Signed)
Pt educated on AVS.  Pt's daughter at [facility] to take pt home.   ?

## 2021-11-27 LAB — CULTURE, BLOOD (ROUTINE X 2)
Culture: NO GROWTH
Culture: NO GROWTH
Special Requests: ADEQUATE

## 2021-11-29 ENCOUNTER — Telehealth: Payer: Self-pay

## 2021-11-29 NOTE — Telephone Encounter (Signed)
Received referral from [facility] to eval patient for possible sleep apnea. Left a voicemail advising patient to contact our office to schedule an appointment. ?

## 2021-12-13 ENCOUNTER — Encounter: Payer: Self-pay | Admitting: Internal Medicine

## 2021-12-13 ENCOUNTER — Ambulatory Visit: Payer: Medicare PPO | Admitting: Internal Medicine

## 2021-12-13 ENCOUNTER — Telehealth: Payer: Self-pay

## 2021-12-13 VITALS — BP 122/68 | HR 76 | Temp 98.4°F | Resp 16 | Ht 62.0 in | Wt 256.2 lb

## 2021-12-13 DIAGNOSIS — G4733 Obstructive sleep apnea (adult) (pediatric): Secondary | ICD-10-CM

## 2021-12-13 DIAGNOSIS — J449 Chronic obstructive pulmonary disease, unspecified: Secondary | ICD-10-CM

## 2021-12-13 DIAGNOSIS — R0602 Shortness of breath: Secondary | ICD-10-CM | POA: Diagnosis not present

## 2021-12-13 NOTE — Telephone Encounter (Signed)
Sleep Study ordered. Printed. Gave to Titania-Toni ?

## 2021-12-13 NOTE — Progress Notes (Signed)
[facility] ?[address] ?[place], [place] [address] ? ?Pulmonary Sleep Medicine  ? ?Office Visit Note ? ?Patient Name: Tamara [name] ?DOB: 1941/05/17 ?MRN [id number] ? ?Date of Service: 12/13/2021 ? ?Complaints/HPI: She was recenly admitted with a serious infection and sepsis. Treated with fluids and abx. Later stopped due to gastroenteritis being the final diagnosis. At that time she had been noted to have desaturations at night. She has never smoked. She had a lot of second hand smoke exposure. She does admit to shortness of breath ?She states that she also was diagnosed with sleep apnea several years ago. Patient does not know where it was done. She was given a CPAP machine but she states she is not using it anymore. She was not able to tolerate the CPAP. She thinks it maybe due to the mask. She is will to try again due to this new finding of ongoing nocturnal hypoxia. CT chest done in 2010 had some nodules never followed up. Also has a history of SVT and is seen by dr Kerrie Pleasure ? ?ROS ? ?General: (-) fever, (-) chills, (-) night sweats, (-) weakness ?Skin: (-) rashes, (-) itching,. ?Eyes: (-) visual changes, (-) redness, (-) itching. ?Nose and Sinuses: (-) nasal stuffiness or itchiness, (-) postnasal drip, (-) nosebleeds, (-) sinus trouble. ?Mouth and Throat: (-) sore throat, (-) hoarseness. ?Neck: (-) swollen glands, (-) enlarged thyroid, (-) neck pain. ?Respiratory: - cough, (-) bloody sputum, + shortness of breath, - wheezing. ?Cardiovascular: - ankle swelling, (-) chest pain. ?Lymphatic: (-) lymph node enlargement. ?Neurologic: (-) numbness, (-) tingling. ?Psychiatric: (-) anxiety, (-) depression ? ? ?Current Medication: ?Outpatient Encounter Medications as of 12/13/2021  ?Medication Sig  ? ACCU-CHEK GUIDE test strip 2 (two) times daily.  ? Accu-Chek Softclix Lancets lancets SMARTSIG:Topical  ? acetaminophen (TYLENOL) 500 MG tablet Take 1,000 mg by mouth every 8 (eight) hours as needed for mild  pain.  ? Ascorbic Acid (VITAMIN C WITH ROSE HIPS) 1000 MG tablet Take 1,000 mg by mouth daily.  ? aspirin EC 81 MG tablet Take 81 mg by mouth daily. Swallow whole.  ? Biotin w/ Vitamins C & E (HAIR/SKIN/NAILS PO) Take 2 tablets by mouth daily.  ? carvedilol (COREG) 3.125 MG tablet Take 3.125 mg by mouth 2 (two) times daily with a meal.  ? COMBIVENT RESPIMAT 20-100 MCG/ACT AERS respimat Inhale 1 puff into the lungs every 6 (six) hours as needed.  ? furosemide (LASIX) 40 MG tablet Take 40 mg by mouth daily.  ? glipiZIDE (GLUCOTROL XL) 2.5 MG 24 hr tablet Take 2.5 mg by mouth daily.  ? JANUMET XR [phone] MG TB24 Take 1 tablet by mouth daily.  ? JARDIANCE 25 MG TABS tablet Take 25 mg by mouth every morning.  ? nitroGLYCERIN (NITROSTAT) 0.4 MG SL tablet Place 0.4 mg under the tongue every 5 (five) minutes as needed for chest pain. Max 3  ? simvastatin (ZOCOR) 20 MG tablet Take 20 mg by mouth daily.  ? SYNTHROID 75 MCG tablet Take 75 mcg by mouth daily before breakfast.  ? vitamin B-12 (CYANOCOBALAMIN) 1000 MCG tablet Take 1,000 mcg by mouth daily.  ? Vitamin D, Ergocalciferol, (DRISDOL) 1.25 MG (50000 UNIT) CAPS capsule Take 50,000 Units by mouth once a week.  ? Zinc 50 MG CAPS Take 1 each by mouth once.  ? ondansetron (ZOFRAN-ODT) 4 MG disintegrating tablet Take by mouth.  ? [DISCONTINUED] VITAMIN E PO Take 450 mg by mouth daily. (Patient not taking: Reported on 12/13/2021)  ? ?  No facility-administered encounter medications on file as of 12/13/2021.  ? ? ?Surgical History: ?Past Surgical History:  ?Procedure Laterality Date  ? ABDOMINAL HYSTERECTOMY    ? ABDOMINAL HYSTERECTOMY  1984  ? ACHILLES TENDON REPAIR Left   ? CARDIAC CATHETERIZATION    ? [place]  ? CARDIAC CATHETERIZATION N/A 05/18/2016  ? Procedure: Left Heart Cath and Coronary Angiography;  Surgeon: Kathleene Hazelhristopher D McAlhany, MD;  [facility];  Service: Cardiovascular;  Laterality: N/A;  ? CATARACT EXTRACTION W/PHACO Left 02/10/2015  ?  Procedure: CATARACT EXTRACTION PHACO AND INTRAOCULAR LENS PLACEMENT (IOC);  Surgeon: Lockie Molahadwick Brasington, MD;  [facility];  Service: Ophthalmology;  Laterality: Left;  DIABETIC  ? CATARACT EXTRACTION W/PHACO Right 02/24/2015  ? Procedure: CATARACT EXTRACTION PHACO AND INTRAOCULAR LENS PLACEMENT (IOC);  Surgeon: Lockie Molahadwick Brasington, MD;  [facility];  Service: Ophthalmology;  Laterality: Right;  ? CESAREAN SECTION  1978  ? CHOLECYSTECTOMY  1986  ? EYE SURGERY Bilateral   ? Cataract   ? TOTAL KNEE ARTHROPLASTY Right 05/12/2016  ? Procedure: TOTAL KNEE ARTHROPLASTY;  Surgeon: Frederico Hammananiel Caffrey, MD;  [facility];  Service: Orthopedics;  Laterality: Right;  ? TOTAL KNEE ARTHROPLASTY Left 12/29/2016  ? Procedure: TOTAL KNEE ARTHROPLASTY;  Surgeon: Frederico Hammanaffrey, Daniel, MD;  [facility];  Service: Orthopedics;  Laterality: Left;  ? ? ?Medical History: ?Past Medical History:  ?Diagnosis Date  ? Anemia   ? distant hx of  ? Arthritis   ? KNEES,HANDS,WRIST  ? Asthma   ? allergy related  ? 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 Bronchitis   ? Cardiomegaly   ? CHF (congestive heart failure) (HCC)   ? stopped using diuretics due to kidney issues, watching salt intake  ? Chronic cough   ? Chronic kidney disease   ? due to DM  ? Complication of anesthesia   ? BP bottomed out during Achilles Tendon repair >3180yrs ago  ? Constipation due to pain medication   ? Diabetes mellitus without complication (HCC)   ? 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 Diverticulitis   ? Dysrhythmia   ? Gout   ? HAP (hospital-acquired pneumonia) 05/16/2016  ? Headache   ? Sinus headaches  ? Hepatitis   ? 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  ? History of blood transfusion   ? Hypertension   ? Hypothyroidism   ? 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 feet  ? NSTEMI (non-ST elevated myocardial infarction) (HCC)   ? Obesity   ? Palpitations   ? Pneumonia   ? hx of- 05/02/16- last time approx - 6 years ago  ? Sepsis  associated hypotension (HCC) 05/16/2016  ? Shortness of breath dyspnea   ? with exertion  ? Sleep apnea   ? does not use CPAP  ? SVT (supraventricular tachycardia) (HCC)   ? Wheezing   ? ? ?Family History: ?Family History  ?Problem Relation Age of Onset  ? Lung cancer Mother   ? Heart attack Mother   ? Heart Problems Father   ? Hypertension Father   ? Other Father   ?     respiratory problems  ? Ovarian cancer Maternal Grandmother   ? Cancer Maternal Grandfather   ? Stroke Paternal Grandmother   ? Diabetes Brother   ? Liver cancer Brother   ? COPD Sister   ? Other Sister   ?     sepsis  ?  Heart attack Son   ? Heart attack Son   ? Rheum arthritis Daughter   ? Rheum arthritis Daughter   ? Thyroid cancer Daughter   ? Hepatitis C Daughter   ? ? ?Social History: ?Social History  ? ?Socioeconomic History  ? Marital status: Divorced  ?  Spouse name: Not on file  ? Number of children: 7  ? Years of education: 57  ? Highest education level: Not on file  ?Occupational History  ? Occupation: school bus driver  ?  Employer: [employer]  ?Tobacco Use  ? Smoking status: Never  ? Smokeless tobacco: Never  ?Vaping Use  ? Vaping Use: Never used  ?Substance and Sexual Activity  ? Alcohol use: No  ? Drug use: No  ? Sexual activity: Not on file  ?Other Topics Concern  ? Not on file  ?Social History Narrative  ? epworth sleepiness scale = 15 (03/01/16)  ? exercises 3-4 times/weekly - 1-1.5 hours in the pool  ? ?Social Determinants of Health  ? ?Financial Resource Strain: Not on file  ?Food Insecurity: Not on file  ?Transportation Needs: Not on file  ?Physical Activity: Not on file  ?Stress: Not on file  ?Social Connections: Not on file  ?Intimate Partner Violence: Not on file  ? ? ?Vital Signs: ?Blood pressure 122/68, pulse 76, temperature 98.4 ?F (36.9 ?C), resp. rate 16, height 5\' 2"  (1.575 m), weight 256 lb 3.2 oz (116.2 kg), SpO2 95 %. ? ?Examination: ?General Appearance: The patient is well-developed, well-nourished,  and in no distress. ?Skin: Gross inspection of skin unremarkable. ?Head: normocephalic, no gross deformities. ?Eyes: no gross deformities noted. ?ENT: ears appear grossly normal no exudates. ?Neck: Supple. No thyrom

## 2021-12-13 NOTE — Patient Instructions (Signed)
Sleep Apnea ?Sleep apnea affects breathing during sleep. It causes breathing to stop for 10 seconds or more, or to become shallow. People with sleep apnea usually snore loudly. ?It can also increase the risk of: ?Heart attack. ?Stroke. ?Being very overweight (obese). ?Diabetes. ?Heart failure. ?Irregular heartbeat. ?High blood pressure. ?The goal of treatment is to help you breathe normally again. ?What are the causes? ? ?The most common cause of this condition is a collapsed or blocked airway. ?There are three kinds of sleep apnea: ?Obstructive sleep apnea. This is caused by a blocked or collapsed airway. ?Central sleep apnea. This happens when the brain does not send the right signals to the muscles that control breathing. ?Mixed sleep apnea. This is a combination of obstructive and central sleep apnea. ?What increases the risk? ?Being overweight. ?Smoking. ?Having a small airway. ?Being older. ?Being female. ?Drinking alcohol. ?Taking medicines to calm yourself (sedatives or tranquilizers). ?Having family members with the condition. ?Having a tongue or tonsils that are larger than normal. ?What are the signs or symptoms? ?Trouble staying asleep. ?Loud snoring. ?Headaches in the morning. ?Waking up gasping. ?Dry mouth or sore throat in the morning. ?Being sleepy or tired during the day. ?If you are sleepy or tired during the day, you may also: ?Not be able to focus your mind (concentrate). ?Forget things. ?Get angry a lot and have mood swings. ?Feel sad (depressed). ?Have changes in your personality. ?Have less interest in sex, if you are female. ?Be unable to have an erection, if you are female. ?How is this treated? ? ?Sleeping on your side. ?Using a medicine to get rid of mucus in your nose (decongestant). ?Avoiding the use of alcohol, medicines to help you relax, or certain pain medicines (narcotics). ?Losing weight, if needed. ?Changing your diet. ?Quitting smoking. ?Using a machine to open your airway while you  sleep, such as: ?An oral appliance. This is a mouthpiece that shifts your lower jaw forward. ?A CPAP device. This device blows air through a mask when you breathe out (exhale). ?An EPAP device. This has valves that you put in each nostril. ?A BIPAP device. This device blows air through a mask when you breathe in (inhale) and breathe out. ?Having surgery if other treatments do not work. ?Follow these instructions at home: ?Lifestyle ?Make changes that your doctor recommends. ?Eat a healthy diet. ?Lose weight if needed. ?Avoid alcohol, medicines to help you relax, and some pain medicines. ?Do not smoke or use any products that contain nicotine or tobacco. If you need help quitting, ask your doctor. ?General instructions ?Take over-the-counter and prescription medicines only as told by your doctor. ?If you were given a machine to use while you sleep, use it only as told by your doctor. ?If you are having surgery, make sure to tell your doctor you have sleep apnea. You may need to bring your device with you. ?Keep all follow-up visits. ?Contact a doctor if: ?The machine that you were given to use during sleep bothers you or does not seem to be working. ?You do not get better. ?You get worse. ?Get help right away if: ?Your chest hurts. ?You have trouble breathing in enough air. ?You have an uncomfortable feeling in your back, arms, or stomach. ?You have trouble talking. ?One side of your body feels weak. ?A part of your face is hanging down. ?These symptoms may be an emergency. Get help right away. Call your local emergency services (911 in the [place] ?Do not wait to see if the   symptoms will go away. ?Do not drive yourself to the hospital. ?Summary ?This condition affects breathing during sleep. ?The most common cause is a collapsed or blocked airway. ?The goal of treatment is to help you breathe normally while you sleep. ?This information is not intended to replace advice given to you by your health care provider. Make  sure you discuss any questions you have with your health care provider. ?Document Revised: 03/23/2021 Document Reviewed: 07/23/2020 ?[employer]vier Patient Education ? 2023 [employer] ?Chronic Obstructive Pulmonary Disease ? ?Chronic obstructive pulmonary disease (COPD) is a long-term (chronic) lung problem. When you have COPD, it is hard for air to get in and out of your lungs. ?Usually the condition gets worse over time, and your lungs will never return to normal. There are things you can do to keep yourself as healthy as possible. ?What are the causes? ?Smoking. This is the most common cause. ?Certain genes passed from parent to child (inherited). ?What increases the risk? ?Being exposed to secondhand smoke from cigarettes, pipes, or cigars. ?Being exposed to chemicals and other irritants, such as fumes and dust in the work environment. ?Having chronic lung conditions or infections. ?What are the signs or symptoms? ?Shortness of breath, especially during physical activity. ?A long-term cough with a large amount of thick mucus. Sometimes, the cough may not have any mucus (dry cough). ?Wheezing. ?Breathing quickly. ?Skin that looks gray or blue, especially in the fingers, toes, or lips. ?Feeling tired (fatigue). ?Weight loss. ?Chest tightness. ?Having infections often. ?Episodes when breathing symptoms become much worse (exacerbations). ?At the later stages of this disease, you may have swelling in the ankles, feet, or legs. ?How is this treated? ?Taking medicines. ?Quitting smoking, if you smoke. ?Rehabilitation. This includes steps to make your body work better. It may involve a team of specialists. ?Doing exercises. ?Making changes to your diet. ?Using oxygen. ?Lung surgery. ?Lung transplant. ?Comfort measures (palliative care). ?Follow these instructions at home: ?Medicines ?Take over-the-counter and prescription medicines only as told by your doctor. ?Talk to your doctor before taking any cough or allergy  medicines. You may need to avoid medicines that cause your lungs to be dry. ?Lifestyle ?If you smoke, stop smoking. Smoking makes the problem worse. ?Do not smoke or use any products that contain nicotine or tobacco. If you need help quitting, ask your doctor. ?Avoid being around things that make your breathing worse. This may include smoke, chemicals, and fumes. ?Stay active, but remember to rest as well. ?Learn and use tips on how to manage stress and control your breathing. ?Make sure you get enough sleep. Most adults need at least 7 hours of sleep every night. ?Eat healthy foods. Eat smaller meals more often. Rest before meals. ?Controlled breathing ?Learn and use tips on how to control your breathing as told by your doctor. Try: ?Breathing in (inhaling) through your nose for 1 second. Then, pucker your lips and breath out (exhale) through your lips for 2 seconds. ?Putting one hand on your belly (abdomen). Breathe in slowly through your nose for 1 second. Your hand on your belly should move out. Pucker your lips and breathe out slowly through your lips. Your hand on your belly should move in as you breathe out. ? ?Controlled coughing ?Learn and use controlled coughing to clear mucus from your lungs. Follow these steps: ?Lean your head a little forward. ?Breathe in deeply. ?Try to hold your breath for 3 seconds. ?Keep your mouth slightly open while coughing 2 times. ?Spit any mucus   out into a tissue. ?Rest and do the steps again 1 or 2 times as needed. ?General instructions ?Make sure you get all the shots (vaccines) that your doctor recommends. Ask your doctor about a flu shot and a pneumonia shot. ?Use oxygen therapy and pulmonary rehabilitation if told by your doctor. If you need home oxygen therapy, ask your doctor if you should buy a tool to measure your oxygen level (oximeter). ?Make a COPD action plan with your doctor. This helps you to know what to do if you feel worse than usual. ?Manage any other  conditions you have as told by your doctor. ?Avoid going outside when it is very hot, cold, or humid. ?Avoid people who have a sickness you can catch (contagious). ?Keep all follow-up visits. ?Contact a doctor if:

## 2021-12-28 ENCOUNTER — Ambulatory Visit (INDEPENDENT_AMBULATORY_CARE_PROVIDER_SITE_OTHER): Payer: Medicare PPO | Admitting: Internal Medicine

## 2021-12-28 DIAGNOSIS — R0602 Shortness of breath: Secondary | ICD-10-CM

## 2022-01-18 NOTE — Procedures (Signed)
[facility] [address] [place], [address]    Complete Pulmonary Function Testing Interpretation:  FINDINGS:  Forced vital capacity is normal.  FEV1 is normal.  FEV1 FVC ratio was mildly decreased.  Postbronchodilator no significant change in FEV1.  Total lung capacity is normal.  Residual volume increased.  Residual on total lung capacity ratio increased.  FRC was normal.  DLCO was normal.  IMPRESSION:  This pulmonary function study is within normal limits clinical correlation is recommended  Yevonne Pax, MD [facility] Pulmonary Critical Care Medicine Sleep Medicine

## 2022-01-19 ENCOUNTER — Encounter: Payer: Self-pay | Admitting: Internal Medicine

## 2022-01-19 LAB — PULMONARY FUNCTION TEST

## 2022-02-06 ENCOUNTER — Ambulatory Visit: Payer: Medicare PPO | Admitting: Internal Medicine

## 2022-02-06 ENCOUNTER — Encounter: Payer: Self-pay | Admitting: Internal Medicine

## 2022-02-06 ENCOUNTER — Telehealth: Payer: Self-pay

## 2022-02-06 VITALS — BP 137/57 | HR 67 | Temp 98.3°F | Resp 16 | Ht 62.0 in | Wt 257.8 lb

## 2022-02-06 DIAGNOSIS — R002 Palpitations: Secondary | ICD-10-CM | POA: Diagnosis not present

## 2022-02-06 DIAGNOSIS — J449 Chronic obstructive pulmonary disease, unspecified: Secondary | ICD-10-CM | POA: Diagnosis not present

## 2022-02-06 DIAGNOSIS — J4489 Other specified chronic obstructive pulmonary disease: Secondary | ICD-10-CM

## 2022-02-06 NOTE — Telephone Encounter (Signed)
Per [facility]. They are still waiting insurance approval for SS-Toni

## 2022-02-06 NOTE — Progress Notes (Unsigned)
[facility] [place], [place] [address]  Pulmonary Sleep Medicine   Office Visit Note  Patient Name: Tamara [name] DOB: 12-17-1940 MRN [id number]  Date of Service: 02/06/2022  Complaints/HPI: She is doing well overall. She had PFT done which was basically normal. She has no cough no congestion. No CT done she wants to hold off. She has not yet been scheduled for the sleep study. Needs to have this done due to OSA. Patient has not been using her PAP. She also had some palpitations and is now back on her carvidelol  ROS  General: (-) fever, (-) chills, (-) night sweats, (-) weakness Skin: (-) rashes, (-) itching,. Eyes: (-) visual changes, (-) redness, (-) itching. Nose and Sinuses: (-) nasal stuffiness or itchiness, (-) postnasal drip, (-) nosebleeds, (-) sinus trouble. Mouth and Throat: (-) sore throat, (-) hoarseness. Neck: (-) swollen glands, (-) enlarged thyroid, (-) neck pain. Respiratory: - cough, (-) bloody sputum, - shortness of breath, - wheezing. Cardiovascular: - ankle swelling, (-) chest pain. Lymphatic: (-) lymph node enlargement. Neurologic: (-) numbness, (-) tingling. Psychiatric: (-) anxiety, (-) depression   Current Medication: Outpatient Encounter Medications as of 02/06/2022  Medication Sig   ACCU-CHEK GUIDE test strip 2 (two) times daily.   Accu-Chek Softclix Lancets lancets SMARTSIG:Topical   acetaminophen (TYLENOL) 500 MG tablet Take 1,000 mg by mouth every 8 (eight) hours as needed for mild pain.   Ascorbic Acid (VITAMIN C WITH ROSE HIPS) 1000 MG tablet Take 1,000 mg by mouth daily.   aspirin EC 81 MG tablet Take 81 mg by mouth daily. Swallow whole.   Biotin w/ Vitamins C & E (HAIR/SKIN/NAILS PO) Take 2 tablets by mouth daily.   carvedilol (COREG) 3.125 MG tablet Take 3.125 mg by mouth 2 (two) times daily with a meal.   COMBIVENT RESPIMAT 20-100 MCG/ACT AERS respimat Inhale 1 puff into the lungs every 6 (six) hours as needed.    furosemide (LASIX) 40 MG tablet Take 40 mg by mouth daily.   glipiZIDE (GLUCOTROL XL) 2.5 MG 24 hr tablet Take 2.5 mg by mouth daily.   JANUMET XR [phone] MG TB24 Take 1 tablet by mouth daily.   JARDIANCE 25 MG TABS tablet Take 25 mg by mouth every morning.   nitroGLYCERIN (NITROSTAT) 0.4 MG SL tablet Place 0.4 mg under the tongue every 5 (five) minutes as needed for chest pain. Max 3   ondansetron (ZOFRAN-ODT) 4 MG disintegrating tablet Take by mouth.   simvastatin (ZOCOR) 20 MG tablet Take 20 mg by mouth daily.   SYNTHROID 75 MCG tablet Take 75 mcg by mouth daily before breakfast.   vitamin B-12 (CYANOCOBALAMIN) 1000 MCG tablet Take 1,000 mcg by mouth daily.   Vitamin D, Ergocalciferol, (DRISDOL) 1.25 MG (50000 UNIT) CAPS capsule Take 50,000 Units by mouth once a week.   Zinc 50 MG CAPS Take 1 each by mouth once.   No facility-administered encounter medications on file as of 02/06/2022.    Surgical History: Past Surgical History:  Procedure Laterality Date   ABDOMINAL HYSTERECTOMY     ABDOMINAL HYSTERECTOMY  1984   ACHILLES TENDON REPAIR Left    CARDIAC CATHETERIZATION     [place]   CARDIAC CATHETERIZATION N/A 05/18/2016   Procedure: Left Heart Cath and Coronary Angiography;  Surgeon: Burnell Blanks, MD;  Location: [place] [facility];  Service: Cardiovascular;  Laterality: N/A;   CATARACT EXTRACTION W/PHACO Left 02/10/2015   Procedure: CATARACT EXTRACTION PHACO AND INTRAOCULAR LENS  PLACEMENT (IOC);  Surgeon: Leandrew Koyanagi, MD;  Location: [place];  Service: Ophthalmology;  Laterality: Left;  DIABETIC   CATARACT EXTRACTION W/PHACO Right 02/24/2015   Procedure: CATARACT EXTRACTION PHACO AND INTRAOCULAR LENS PLACEMENT (IOC);  Surgeon: Leandrew Koyanagi, MD;  Location: [place];  Service: Ophthalmology;  Laterality: Right;   [place]   EYE SURGERY Bilateral    Cataract    TOTAL KNEE ARTHROPLASTY Right  05/12/2016   Procedure: TOTAL KNEE ARTHROPLASTY;  Surgeon: Earlie Server, MD;  Location: [place];  Service: Orthopedics;  Laterality: Right;   TOTAL KNEE ARTHROPLASTY Left 12/29/2016   Procedure: TOTAL KNEE ARTHROPLASTY;  Surgeon: Earlie Server, MD;  Location: [place];  Service: Orthopedics;  Laterality: Left;    Medical History: 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   Bronchitis    Cardiomegaly    CHF (congestive heart failure) (HCC)    stopped using diuretics due to kidney issues, watching salt intake   Chronic cough    Chronic kidney disease    due to DM   Complication of anesthesia    BP bottomed out during Achilles Tendon repair >59yrs ago   Constipation due to pain medication    Diabetes mellitus without complication (HCC)    type 2  not on medications   Diverticulitis    Dysrhythmia    Gout    HAP (hospital-acquired pneumonia) 05/16/2016   Headache    Sinus headaches   Hepatitis    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   History of blood transfusion    Hypertension    Hypothyroidism    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   NSTEMI (non-ST elevated myocardial infarction) ([place])    Obesity    Palpitations    Pneumonia    hx of- 05/02/16- last time approx - 6 years ago   Sepsis associated hypotension ([place]) 05/16/2016   Shortness of breath dyspnea    with exertion   Sleep apnea    does not use CPAP   SVT (supraventricular tachycardia) ([place])    Wheezing     Family History: Family History  Problem Relation Age of Onset   Lung cancer Mother    Heart attack Mother    Heart Problems Father    Hypertension Father    Other Father        respiratory problems   Ovarian cancer Maternal Grandmother    Cancer Maternal Grandfather    Stroke Paternal Grandmother    Diabetes Brother    Liver cancer Brother    COPD Sister     Other Sister        sepsis   Heart attack Son    Heart attack Son    Rheum arthritis Daughter    Rheum arthritis Daughter    Thyroid cancer Daughter    Hepatitis C Daughter     Social History: Social History   Socioeconomic History   Marital status: Divorced    Spouse name: Not on file   Number of children: 7   Years of education: 12   Highest education level: Not on file  Occupational History   Occupation: school bus driver    Employer: [employer] [place]  Tobacco Use  Smoking status: Never   Smokeless tobacco: Never  Vaping Use   Vaping Use: Never used  Substance and Sexual Activity   Alcohol use: No   Drug use: No   Sexual activity: Not on file  Other Topics Concern   Not on file  Social History Narrative   epworth sleepiness scale = 15 (03/01/16)   exercises 3-4 times/weekly - 1-1.5 hours in the pool   Social Determinants of Health   Financial Resource Strain: Not on file  Food Insecurity: Not on file  Transportation Needs: Not on file  Physical Activity: Not on file  Stress: Not on file  Social Connections: Not on file  Intimate Partner Violence: Not on file    Vital Signs: Blood pressure (!) 137/57, pulse 67, temperature 98.3 F (36.8 C), resp. rate 16, height 5\' 2"  (1.575 m), weight 257 lb 12.8 oz (116.9 kg), SpO2 98 %.  Examination: General Appearance: The patient is well-developed, well-nourished, and in no distress. Skin: Gross inspection of skin unremarkable. Head: normocephalic, no gross deformities. Eyes: no gross deformities noted. ENT: ears appear grossly normal no exudates. Neck: Supple. No thyromegaly. No LAD. Respiratory: no rhonchi noted. Cardiovascular: Normal S1 and S2 without murmur or rub. Extremities: No cyanosis. pulses are equal. Neurologic: Alert and oriented. No involuntary movements.  LABS: Recent Results (from the past 2160 hour(s))  CBC     Status: Abnormal   Collection Time: 11/22/21  7:36 PM  Result Value  Ref Range   WBC 12.4 (H) 4.0 - 10.5 K/uL   RBC 4.81 3.87 - 5.11 MIL/uL   Hemoglobin 13.7 12.0 - 15.0 g/dL   HCT 43.6 36.0 - 46.0 %   MCV 90.6 80.0 - 100.0 fL   MCH 28.5 26.0 - 34.0 pg   MCHC 31.4 30.0 - 36.0 g/dL   RDW 14.6 11.5 - 15.5 %   Platelets 282 150 - 400 K/uL   nRBC 0.0 0.0 - 0.2 %    Comment: Performed at [facility], [address], [place], [place] [address]  Comprehensive metabolic panel     Status: Abnormal   Collection Time: 11/22/21  7:36 PM  Result Value Ref Range   Sodium 140 135 - 145 mmol/L   Potassium 4.0 3.5 - 5.1 mmol/L   Chloride 105 98 - 111 mmol/L   CO2 26 22 - 32 mmol/L   Glucose, Bld 160 (H) 70 - 99 mg/dL    Comment: Glucose reference range applies only to samples taken after fasting for at least 8 hours.   BUN 27 (H) 8 - 23 mg/dL   Creatinine, Ser 1.02 (H) 0.44 - 1.00 mg/dL   Calcium 8.9 8.9 - 10.3 mg/dL   Total Protein 8.0 6.5 - 8.1 g/dL   Albumin 3.6 3.5 - 5.0 g/dL   AST 30 15 - 41 U/L   ALT 22 0 - 44 U/L   Alkaline Phosphatase 55 38 - 126 U/L   Total Bilirubin 1.0 0.3 - 1.2 mg/dL   GFR, Estimated 56 (L) >60 mL/min    Comment: (NOTE) Calculated using the CKD-EPI Creatinine Equation (2021)    Anion gap 9 5 - 15    Comment: Performed at [facility], [address] [place], [place] [address]  Lipase, blood     Status: None   Collection Time: 11/22/21  7:36 PM  Result Value Ref Range   Lipase 42 11 - 51 U/L    Comment: Performed at [facility], [address] [place], [place] [address]  Lactic acid, plasma     Status: None   Collection Time: 11/22/21  7:36 PM  Result Value Ref Range   Lactic Acid, Venous 1.7 0.5 - 1.9 mmol/L    Comment: Performed at [facility], [place], [place] [address]  Troponin I (High Sensitivity)     Status: None   Collection Time: 11/22/21  7:36 PM  Result Value Ref Range   Troponin I (High Sensitivity) 5 <18 ng/L    Comment: (NOTE) Elevated high  sensitivity troponin I (hsTnI) values and significant  changes across serial measurements may suggest ACS but many other  chronic and acute conditions are known to elevate hsTnI results.  Refer to the "Links" section for chest pain algorithms and additional  guidance. Performed at [facility], [address] [place], [place] [address]   Brain natriuretic peptide     Status: None   Collection Time: 11/22/21  7:36 PM  Result Value Ref Range   B Natriuretic Peptide 53.1 0.0 - 100.0 pg/mL    Comment: Performed at [facility], [address] [place], [place] [address]  Resp Panel by RT-PCR (Flu A&B, Covid) Nasopharyngeal Swab     Status: None   Collection Time: 11/22/21 10:14 PM   Specimen: Nasopharyngeal Swab; Nasopharyngeal(NP) swabs in vial transport medium  Result Value Ref Range   SARS Coronavirus 2 by RT PCR NEGATIVE NEGATIVE    Comment: (NOTE) SARS-CoV-2 target nucleic acids are NOT DETECTED.  The SARS-CoV-2 RNA is generally detectable in upper respiratory specimens during the acute phase of infection. The lowest concentration of SARS-CoV-2 viral copies this assay can detect is 138 copies/mL. A negative result does not preclude SARS-Cov-2 infection and should not be used as the sole basis for treatment or other patient management decisions. A negative result may occur with  improper specimen collection/handling, submission of specimen other than nasopharyngeal swab, presence of viral mutation(s) within the areas targeted by this assay, and inadequate number of viral copies(<138 copies/mL). A negative result must be combined with clinical observations, patient history, and epidemiological information. The expected result is Negative.  Fact Sheet for Patients:  EntrepreneurPulse.com.au  Fact Sheet for Healthcare Providers:  IncredibleEmployment.be  This test is no t yet approved or cleared by the [place] FDA and   has been authorized for detection and/or diagnosis of SARS-CoV-2 by FDA under an Emergency Use Authorization (EUA). This EUA will remain  in effect (meaning this test can be used) for the duration of the COVID-19 declaration under Section 564(b)(1) of the Act, 21 U.S.C.section 360bbb-3(b)(1), unless the authorization is terminated  or revoked sooner.       Influenza A by PCR NEGATIVE NEGATIVE   Influenza B by PCR NEGATIVE NEGATIVE    Comment: (NOTE) The Xpert Xpress SARS-CoV-2/FLU/RSV plus assay is intended as an aid in the diagnosis of influenza from Nasopharyngeal swab specimens and should not be used as a sole basis for treatment. Nasal washings and aspirates are unacceptable for Xpert Xpress SARS-CoV-2/FLU/RSV testing.  Fact Sheet for Patients: EntrepreneurPulse.com.au  Fact Sheet for Healthcare Providers: IncredibleEmployment.be  This test is not yet approved or cleared by the [place] FDA and has been authorized for detection and/or diagnosis of SARS-CoV-2 by FDA under an Emergency Use Authorization (EUA). This EUA will remain in effect (meaning this test can be used) for the duration of the COVID-19 declaration under Section 564(b)(1) of the Act, 21 U.S.C. section 360bbb-3(b)(1), unless the  authorization is terminated or revoked.  Performed at [facility], [address] [place], [place] [address]   Blood Culture (routine x 2)     Status: None   Collection Time: 11/22/21 10:30 PM   Specimen: BLOOD  Result Value Ref Range   Specimen Description BLOOD RIGHT ASSIST CONTROL    Special Requests      BOTTLES DRAWN AEROBIC AND ANAEROBIC Blood Culture adequate volume   Culture      NO GROWTH 5 DAYS Performed at [facility], [address] [place], [place] [address]    Report Status 11/27/2021 FINAL   Blood Culture (routine x 2)     Status: None   Collection Time: 11/22/21 10:31 PM   Specimen: BLOOD   Result Value Ref Range   Specimen Description BLOOD LEFT ASSIST CONTROL    Special Requests      BOTTLES DRAWN AEROBIC AND ANAEROBIC Blood Culture results may not be optimal due to an excessive volume of blood received in culture bottles   Culture      NO GROWTH 5 DAYS Performed at [facility], [address] [place], [place] [address]    Report Status 11/27/2021 FINAL   Urinalysis, Complete w Microscopic Urine, Clean Catch     Status: Abnormal   Collection Time: 11/22/21 10:32 PM  Result Value Ref Range   Color, Urine STRAW (A) YELLOW   APPearance CLEAR (A) CLEAR   Specific Gravity, Urine 1.028 1.005 - 1.030   pH 5.0 5.0 - 8.0   Glucose, UA >=500 (A) NEGATIVE mg/dL   Hgb urine dipstick NEGATIVE NEGATIVE   Bilirubin Urine NEGATIVE NEGATIVE   Ketones, ur 5 (A) NEGATIVE mg/dL   Protein, ur NEGATIVE NEGATIVE mg/dL   Nitrite NEGATIVE NEGATIVE   Leukocytes,Ua NEGATIVE NEGATIVE   RBC / HPF 0-5 0 - 5 RBC/hpf   WBC, UA 0-5 0 - 5 WBC/hpf   Bacteria, UA RARE (A) NONE SEEN   Squamous Epithelial / LPF 0-5 0 - 5   Mucus PRESENT     Comment: Performed at [facility], [address], [place], [place] [address]  Urine Culture     Status: None   Collection Time: 11/22/21 10:32 PM   Specimen: Urine, Random  Result Value Ref Range   Specimen Description      URINE, RANDOM Performed at [facility], [address], [place], [place] [address]    Special Requests      NONE Performed at [facility], [address], [place], [place] [address]    Culture      NO GROWTH Performed at [facility], [place] [address], [place], [place] [address]    Report Status 11/24/2021 FINAL   CBG monitoring, ED     Status: Abnormal   Collection Time: 11/22/21 11:29 PM  Result Value Ref Range   Glucose-Capillary 145 (H) 70 - 99 mg/dL    Comment: Glucose reference range applies only to samples taken after fasting for at least 8 hours.  Basic metabolic panel      Status: Abnormal   Collection Time: 11/23/21  4:14 AM  Result Value Ref Range   Sodium 140 135 - 145 mmol/L   Potassium 3.0 (L) 3.5 - 5.1 mmol/L   Chloride 111 98 - 111 mmol/L   CO2 21 (L) 22 - 32 mmol/L   Glucose, Bld 150 (H) 70 - 99 mg/dL    Comment: Glucose reference range applies only to samples  taken after fasting for at least 8 hours.   BUN 29 (H) 8 - 23 mg/dL   Creatinine, Ser 1.27 (H) 0.44 - 1.00 mg/dL   Calcium 6.9 (L) 8.9 - 10.3 mg/dL   GFR, Estimated 43 (L) >60 mL/min    Comment: (NOTE) Calculated using the CKD-EPI Creatinine Equation (2021)    Anion gap 8 5 - 15    Comment: Performed at [facility], [address] [place], [place] [address]  CBC     Status: Abnormal   Collection Time: 11/23/21  4:14 AM  Result Value Ref Range   WBC 14.5 (H) 4.0 - 10.5 K/uL   RBC 4.02 3.87 - 5.11 MIL/uL   Hemoglobin 11.3 (L) 12.0 - 15.0 g/dL   HCT 37.0 36.0 - 46.0 %   MCV 92.0 80.0 - 100.0 fL   MCH 28.1 26.0 - 34.0 pg   MCHC 30.5 30.0 - 36.0 g/dL   RDW 14.8 11.5 - 15.5 %   Platelets 245 150 - 400 K/uL   nRBC 0.0 0.0 - 0.2 %    Comment: Performed at [facility], [address] [place], [place] [address]  Hemoglobin A1c     Status: Abnormal   Collection Time: 11/23/21  4:14 AM  Result Value Ref Range   Hgb A1c MFr Bld 6.8 (H) 4.8 - 5.6 %    Comment: (NOTE) Pre diabetes:          5.7%-6.4%  Diabetes:              >6.4%  Glycemic control for   <7.0% adults with diabetes    Mean Plasma Glucose 148.46 mg/dL    Comment: Performed at [facility], [place] [address], [place], [place] [address]  Troponin I (High Sensitivity)     Status: Abnormal   Collection Time: 11/23/21  4:14 AM  Result Value Ref Range   Troponin I (High Sensitivity) 51 (H) <18 ng/L    Comment: READ BACK AND VERIFIED WITH KAITLYN [email or web address]  11/23/21 RH (NOTE) Elevated high sensitivity troponin I (hsTnI) values and significant  changes across serial measurements may suggest ACS but  many other  chronic and acute conditions are known to elevate hsTnI results.  Refer to the "Links" section for chest pain algorithms and additional  guidance. Performed at [facility], [address] [place], [place] [address]   Magnesium     Status: Abnormal   Collection Time: 11/23/21  4:14 AM  Result Value Ref Range   Magnesium 1.6 (L) 1.7 - 2.4 mg/dL    Comment: Performed at [facility], [address] [place], [place] [address]  Phosphorus     Status: Abnormal   Collection Time: 11/23/21  4:14 AM  Result Value Ref Range   Phosphorus 2.1 (L) 2.5 - 4.6 mg/dL    Comment: Performed at [facility], [address] [place], [place] [address]  CBG monitoring, ED     Status: Abnormal   Collection Time: 11/23/21  4:23 AM  Result Value Ref Range   Glucose-Capillary 145 (H) 70 - 99 mg/dL    Comment: Glucose reference range applies only to samples taken after fasting for at least 8 hours.  CBG monitoring, ED     Status: Abnormal   Collection Time: 11/23/21  8:16 AM  Result Value Ref Range   Glucose-Capillary 120 (H) 70 - 99 mg/dL    Comment: Glucose reference range applies only to samples taken after fasting for at  least 8 hours.   Comment 1 Document in Chart   ECHOCARDIOGRAM COMPLETE     Status: None   Collection Time: 11/23/21  8:38 AM  Result Value Ref Range   Weight 4,032 oz   Height 62 in   BP 105/48 mmHg   Ao pk vel 1.49 m/s   AV Area VTI 2.38 cm2   AR max vel 2.03 cm2   AV Mean grad 4.5 mmHg   AV Peak grad 8.8 mmHg   S' Lateral 2.70 cm   AV Area mean vel 2.06 cm2   Area-P 1/2 2.51 cm2   MV VTI 1.78 cm2  CBG monitoring, ED     Status: Abnormal   Collection Time: 11/23/21 12:03 PM  Result Value Ref Range   Glucose-Capillary 144 (H) 70 - 99 mg/dL    Comment: Glucose reference range applies only to samples taken after fasting for at least 8 hours.   Comment 1 Document in Chart   Glucose, capillary     Status: Abnormal   Collection Time:  11/23/21  4:38 PM  Result Value Ref Range   Glucose-Capillary 121 (H) 70 - 99 mg/dL    Comment: Glucose reference range applies only to samples taken after fasting for at least 8 hours.   Comment 1 Notify RN   Glucose, capillary     Status: Abnormal   Collection Time: 11/23/21  9:58 PM  Result Value Ref Range   Glucose-Capillary 143 (H) 70 - 99 mg/dL    Comment: Glucose reference range applies only to samples taken after fasting for at least 8 hours.  Glucose, capillary     Status: Abnormal   Collection Time: 11/24/21  3:33 AM  Result Value Ref Range   Glucose-Capillary 148 (H) 70 - 99 mg/dL    Comment: Glucose reference range applies only to samples taken after fasting for at least 8 hours.  Glucose, capillary     Status: Abnormal   Collection Time: 11/24/21  8:49 AM  Result Value Ref Range   Glucose-Capillary 108 (H) 70 - 99 mg/dL    Comment: Glucose reference range applies only to samples taken after fasting for at least 8 hours.  Basic metabolic panel     Status: Abnormal   Collection Time: 11/24/21 10:28 AM  Result Value Ref Range   Sodium 134 (L) 135 - 145 mmol/L   Potassium 4.3 3.5 - 5.1 mmol/L   Chloride 106 98 - 111 mmol/L   CO2 23 22 - 32 mmol/L   Glucose, Bld 138 (H) 70 - 99 mg/dL    Comment: Glucose reference range applies only to samples taken after fasting for at least 8 hours.   BUN 25 (H) 8 - 23 mg/dL   Creatinine, Ser 1.18 (H) 0.44 - 1.00 mg/dL   Calcium 7.9 (L) 8.9 - 10.3 mg/dL   GFR, Estimated 47 (L) >60 mL/min    Comment: (NOTE) Calculated using the CKD-EPI Creatinine Equation (2021)    Anion gap 5 5 - 15    Comment: Performed at [facility], [address] [place], [place] [address]  Magnesium     Status: None   Collection Time: 11/24/21 10:28 AM  Result Value Ref Range   Magnesium 2.4 1.7 - 2.4 mg/dL    Comment: Performed at [facility], [address], [place], [place] [address]  CBC with Differential/Platelet     Status:  Abnormal   Collection Time: 11/24/21 10:28 AM  Result Value Ref Range  WBC 6.6 4.0 - 10.5 K/uL   RBC 4.15 3.87 - 5.11 MIL/uL   Hemoglobin 11.6 (L) 12.0 - 15.0 g/dL   HCT 38.0 36.0 - 46.0 %   MCV 91.6 80.0 - 100.0 fL   MCH 28.0 26.0 - 34.0 pg   MCHC 30.5 30.0 - 36.0 g/dL   RDW 15.2 11.5 - 15.5 %   Platelets 217 150 - 400 K/uL   nRBC 0.0 0.0 - 0.2 %   Neutrophils Relative % 67 %   Neutro Abs 4.5 1.7 - 7.7 K/uL   Lymphocytes Relative 21 %   Lymphs Abs 1.4 0.7 - 4.0 K/uL   Monocytes Relative 11 %   Monocytes Absolute 0.7 0.1 - 1.0 K/uL   Eosinophils Relative 0 %   Eosinophils Absolute 0.0 0.0 - 0.5 K/uL   Basophils Relative 0 %   Basophils Absolute 0.0 0.0 - 0.1 K/uL   Immature Granulocytes 1 %   Abs Immature Granulocytes 0.03 0.00 - 0.07 K/uL    Comment: Performed at [facility], [address] [place], [place] [address]  Glucose, capillary     Status: Abnormal   Collection Time: 11/24/21 12:09 PM  Result Value Ref Range   Glucose-Capillary 138 (H) 70 - 99 mg/dL    Comment: Glucose reference range applies only to samples taken after fasting for at least 8 hours.  Glucose, capillary     Status: Abnormal   Collection Time: 11/24/21  3:55 PM  Result Value Ref Range   Glucose-Capillary 116 (H) 70 - 99 mg/dL    Comment: Glucose reference range applies only to samples taken after fasting for at least 8 hours.  Pulmonary Function Test     Status: None   Collection Time: 01/19/22  1:40 PM  Result Value Ref Range   FEV1     FVC     FEV1/FVC     TLC     DLCO      Radiology: DG Chest Port 1 View  Result Date: 11/23/2021 CLINICAL DATA:  Pneumonia. EXAM: PORTABLE CHEST 1 VIEW COMPARISON:  November 22, 2021. FINDINGS: Stable cardiomediastinal silhouette. Both lungs are clear. The visualized skeletal structures are unremarkable. IMPRESSION: No active disease. Electronically Signed   By: Marijo Conception M.D.   On: 11/23/2021 10:35   ECHOCARDIOGRAM COMPLETE  Result Date:  11/23/2021    ECHOCARDIOGRAM REPORT   Patient Name:   Tamara [name] Date of Exam: 11/23/2021 Medical Rec #:  [id number]      Height:       62.0 in Accession #:    [id number]     Weight:       252.0 lb Date of Birth:  1940-11-12      BSA:          2.109 m Patient Age:    81 years       BP:           105/48 mmHg Patient Gender: F              HR:           63 bpm. Exam Location:  [facility] Procedure: 2D Echo, Color Doppler and Cardiac Doppler Indications:     CHF-acute diastolic [id number]  History:         Patient has prior history of Echocardiogram examinations, most                  recent 05/17/2016. CHF; Risk Factors:Diabetes and Hypertension.  Sonographer:  Sherrie Sport Referring Phys:  [id number] Athena Masse Diagnosing Phys: Kathlyn Sacramento MD IMPRESSIONS  1. Left ventricular ejection fraction, by estimation, is 60 to 65%. The left ventricle has normal function. The left ventricle has no regional wall motion abnormalities. Left ventricular diastolic parameters are consistent with Grade I diastolic dysfunction (impaired relaxation).  2. Right ventricular systolic function is normal. The right ventricular size is normal. There is normal pulmonary artery systolic pressure.  3. Left atrial size was mildly dilated.  4. Right atrial size was mildly dilated.  5. The mitral valve is normal in structure. No evidence of mitral valve regurgitation. No evidence of mitral stenosis.  6. The aortic valve is normal in structure. Aortic valve regurgitation is not visualized. No aortic stenosis is present.  7. The inferior vena cava is normal in size with greater than 50% respiratory variability, suggesting right atrial pressure of 3 mmHg. FINDINGS  Left Ventricle: Left ventricular ejection fraction, by estimation, is 60 to 65%. The left ventricle has normal function. The left ventricle has no regional wall motion abnormalities. The left ventricular internal cavity size was normal in size. There is  no left ventricular hypertrophy. Left  ventricular diastolic parameters are consistent with Grade I diastolic dysfunction (impaired relaxation). Right Ventricle: The right ventricular size is normal. No increase in right ventricular wall thickness. Right ventricular systolic function is normal. There is normal pulmonary artery systolic pressure. The tricuspid regurgitant velocity is 1.83 m/s, and  with an assumed right atrial pressure of 3 mmHg, the estimated right ventricular systolic pressure is [id number] mmHg. Left Atrium: Left atrial size was mildly dilated. Right Atrium: Right atrial size was mildly dilated. Pericardium: There is no evidence of pericardial effusion. Mitral Valve: The mitral valve is normal in structure. No evidence of mitral valve regurgitation. No evidence of mitral valve stenosis. MV peak gradient, 4.8 mmHg. The mean mitral valve gradient is 2.0 mmHg. Tricuspid Valve: The tricuspid valve is normal in structure. Tricuspid valve regurgitation is not demonstrated. No evidence of tricuspid stenosis. Aortic Valve: The aortic valve is normal in structure. Aortic valve regurgitation is not visualized. No aortic stenosis is present. Aortic valve mean gradient measures 4.5 mmHg. Aortic valve peak gradient measures 8.8 mmHg. Aortic valve area, by VTI measures 2.38 cm. Pulmonic Valve: The pulmonic valve was normal in structure. Pulmonic valve regurgitation is not visualized. No evidence of pulmonic stenosis. Aorta: The aortic root is normal in size and structure. Venous: The inferior vena cava is normal in size with greater than 50% respiratory variability, suggesting right atrial pressure of 3 mmHg. IAS/Shunts: No atrial level shunt detected by color flow Doppler.  LEFT VENTRICLE PLAX 2D LVIDd:         4.00 cm   Diastology LVIDs:         2.70 cm   LV e' medial:    5.11 cm/s LV PW:         1.00 cm   LV E/e' medial:  16.4 LV IVS:        0.95 cm   LV e' lateral:   6.31 cm/s LVOT diam:     2.00 cm   LV E/e' lateral: 13.3 LV SV:         66 LV SV  Index:   31 LVOT Area:     3.14 cm  RIGHT VENTRICLE RV Basal diam:  3.20 cm RV S prime:     14.30 cm/s TAPSE (M-mode): 2.7 cm LEFT ATRIUM  Index        RIGHT ATRIUM           Index LA diam:        3.30 cm 1.56 cm/m   RA Area:     23.80 cm LA Vol (A2C):   87.9 ml 41.68 ml/m  RA Volume:   72.90 ml  34.57 ml/m LA Vol (A4C):   71.2 ml 33.76 ml/m LA Biplane Vol: 79.7 ml 37.79 ml/m  AORTIC VALVE                    PULMONIC VALVE AV Area (Vmax):    2.03 cm     PV Vmax:        0.82 m/s AV Area (Vmean):   2.06 cm     PV Vmean:       61.000 cm/s AV Area (VTI):     2.38 cm     PV VTI:         0.142 m AV Vmax:           148.50 cm/s  PV Peak grad:   2.7 mmHg AV Vmean:          94.800 cm/s  PV Mean grad:   2.0 mmHg AV VTI:            0.278 m      RVOT Peak grad: 3 mmHg AV Peak Grad:      8.8 mmHg AV Mean Grad:      4.5 mmHg LVOT Vmax:         96.00 cm/s LVOT Vmean:        62.100 cm/s LVOT VTI:          0.211 m LVOT/AV VTI ratio: 0.76  AORTA Ao Root diam: 3.00 cm MITRAL VALVE                TRICUSPID VALVE MV Area (PHT): 2.51 cm     TR Peak grad:   13.4 mmHg MV Area VTI:   1.78 cm     TR Vmax:        183.00 cm/s MV Peak grad:  4.8 mmHg MV Mean grad:  2.0 mmHg     SHUNTS MV Vmax:       1.10 m/s     Systemic VTI:  0.21 m MV Vmean:      63.8 cm/s    Systemic Diam: 2.00 cm MV Decel Time: 302 msec     Pulmonic VTI:  0.114 m MV E velocity: 84.00 cm/s MV A velocity: 108.00 cm/s MV E/A ratio:  0.78 Kathlyn Sacramento MD Electronically signed by Kathlyn Sacramento MD Signature Date/Time: 11/23/2021/9:31:33 AM    Final     No results found.  No results found.    Assessment and Plan: Patient Active Problem List   Diagnosis Date Noted   Possible Aspiration into respiratory tract 11/23/2021   Acute gastroenteritis 11/22/2021   Chronic diastolic CHF (congestive heart failure) ([place]) 11/22/2021   History of supraventricular tachycardia 11/22/2021   Chronic kidney disease, stage 3a ([place]) 11/22/2021   Obesity, Class  III, BMI 40-49.9 (morbid obesity) ([place]) 12/30/2016   Primary localized osteoarthritis of left knee 12/29/2016   Hypothyroidism 12/29/2016   Diabetes mellitus ([place]) 12/29/2016   CHF with left ventricular diastolic dysfunction, NYHA class 2 ([place]) 12/29/2016   Yeast infection of the skin 12/29/2016   Hypertension 12/29/2016   Sepsis associated hypotension ([place]) 05/16/2016   Acute respiratory failure with hypoxia ([place]) 05/16/2016  Primary localized osteoarthritis of right knee 05/12/2016   Paroxysmal SVT (supraventricular tachycardia) ([place]) 03/01/2016   Preprocedural cardiovascular examination 03/01/2016    1. Obstructive chronic bronchitis without exacerbation ([place]) Needs to get her PSG done will follow up after that  2. Obesity, morbid ([place]) Obesity Counseling: Had a lengthy discussion regarding patients BMI and weight issues. Patient was instructed on portion control as well as increased activity. Also discussed caloric restrictions with trying to maintain intake less than 2000 Kcal. Discussions were made in accordance with the 5As of weight management. Simple actions such as not eating late and if able to, taking a walk is suggested.   3. Palpitation Controlled now back on her carvidelol   General Counseling: I have discussed the findings of the evaluation and examination with Elzie.  I have also discussed any further diagnostic evaluation thatmay be needed or ordered today. Darneshia verbalizes understanding of the findings of todays visit. We also reviewed her medications today and discussed drug interactions and side effects including but not limited excessive drowsiness and altered mental states. We also discussed that there is always a risk not just to her but also people around her. she has been encouraged to call the office with any questions or concerns that should arise related to todays visit.  No orders of the defined types were placed in this encounter.    Time spent: 21  I have  personally obtained a history, examined the patient, evaluated laboratory and imaging results, formulated the assessment and plan and placed orders.    Allyne Gee, MD [facility] Pulmonary and Critical Care Sleep medicine

## 2022-02-06 NOTE — Patient Instructions (Signed)

## 2022-02-08 ENCOUNTER — Telehealth: Payer: Self-pay

## 2022-02-08 NOTE — Telephone Encounter (Signed)
Patient scheduled for PSG on 02/15/22 @ [facility]

## 2022-02-15 ENCOUNTER — Encounter: Payer: Self-pay | Admitting: Internal Medicine

## 2022-02-15 ENCOUNTER — Encounter (INDEPENDENT_AMBULATORY_CARE_PROVIDER_SITE_OTHER): Payer: Medicare PPO | Admitting: Internal Medicine

## 2022-02-15 DIAGNOSIS — G4719 Other hypersomnia: Secondary | ICD-10-CM

## 2022-02-15 DIAGNOSIS — G4710 Hypersomnia, unspecified: Secondary | ICD-10-CM | POA: Diagnosis not present

## 2022-03-03 NOTE — Procedures (Signed)
[facility]  Polysomnogram Report Part I                                                                 Phone: [phone] Fax: [phone]  Patient Name: Tamara, [name] Acquisition Number: [id number]  Date of Birth: 18-Apr-1941 Acquisition Date: 02/15/2022  Referring Physician: Yevonne Pax MD     History: The patient is a 81 year old female who was referred for evaluation of possible sleep apnea with snoring and sleepiness. Medical History: anemia, arthritis, asthma, CHF, chronic kidney disease, diabetes, diverticulitis, dysrhythmia, gout, hepatitis, hypertension, hypothyroidism, neuropathy, OSA, SVT  Medications: Acetaminophen, ascorbic acid, aspirin, biotin, carvedilol, furosemide, glipizide, janumet XR, jardiance, nitroglycerin, simvastatin, synthroid, vitamin B12, Vitamin D, ondansetron  Procedure: This routine overnight polysomnogram was performed on the Alice 4 or 5 using the standard diagnostic protocol. This included 6 channels of EEG, 2 channels of EOG, chin EMG, bilateral anterior tibialis EMG, nasal/oral thermister, PTAF (nasal pressure transducer), chest and abdominal wall movements, EKG, pulse oximetry and EtCO2 when appropriate.  Description: The total recording time was 466.0 minutes. The total sleep time was 392.5 minutes. There were a total of 29.0 minutes of wakefulness after sleep onset for a reducedsleep efficiency of 84.2%. The latency to sleep onset was prolonged at 44.5 minutes. The R sleep onset latency was prolonged at 213.0 minutes. Sleep parameters, as a percentage of the total sleep time, demonstrated 1.7% of sleep was in N1 sleep, 89.3% N2, 0.0% N3 and 9.0% R sleep. There were a total of 54 arousals for an arousal index of 8.3 arousals per hour of sleep that was normal.  Respiratory monitoring demonstrated nearly continuous mild degree of snoring in all positions. There were 562 apneas and hypopneas for an Apnea Hypopnea Index of 85.9 apneas and  hypopneas per hour of sleep. The REM related apnea hypopnea index was 77.7/hr of REM sleep compared to a NREM AHI of 86.7/hr.  The average duration of the respiratory events was 19.2 seconds with a maximum duration of 74.0 seconds. The respiratory events occurred in all positions. The respiratory events were associated with peripheral oxygen desaturations on the average to 89%. The lowest oxygen desaturation associated with a respiratory event was 71%. Additionally, the baseline oxygen saturation during wakefulness was 94%, during NREM sleep averaged 93%, and during REM sleep averaged  90%. The total duration of oxygen < 90% was 24.2 minutes and <80% was 2.3 minutes.  Cardiac monitoring- did not demonstrate transient cardiac decelerations associated with the apneas. There were no significant cardiac rhythm irregularities.   Periodic limb movement monitoring- demonstrated that there were 166 periodic limb movements for a periodic limb movement index of 25.4 periodic limb movements per hour of sleep.   Impression: This routine overnight polysomnogram demonstrated significant obstructive sleep apnea with an overall Apnea Hypopnea Index of 85.9 apneas and hypopneas per hour of sleep. The respiratory events were associated with peripheral oxygen desaturations on the average to 89% with the lowest desaturation to 71%.    There was a significantly elevated periodic limb movement index of 25.4 periodic limb movements per hour of sleep.   There was a reduced sleep efficiency failure to progress into the deeper stages of sleepThese findings would appear to be due  to the combination of obstructive sleep apnea and periodic limb movements. Separate treatment for periodic limb movements may be suggested if clinically indicated.  Recommendations:    A CPAP titration would be recommended due to the severity of the sleep apnea. Additionally, would recommend weight loss in a patient with a BMI of 46.1.     Allyne Gee, MD [facility] Diplomate ABMS Pulmonary Critical Care Medicine Sleep Medicine Electronically reviewed and digitally signed   [place] Report Part II  Phone: [phone] Fax: [phone]  Patient last name [name] Neck Size 15.0   in. Acquisition [id number]  Patient first name Tamara Weight 252.0 lbs. Started 02/15/2022 at 9:37:02 PM  Birth date 02-20-1941 Height 62.0 in. Stopped 02/16/2022 at 5:31:56 AM  Age 53 BMI 46.1 lb/in2 Duration 466.0  Study Type Adult      Rod Holler RPSGT, Margaretmary Eddy Sleep Data: Lights Out: 9:43:02 PM Sleep Onset: 10:27:32 PM  Lights On: 5:29:02 AM Sleep Efficiency: 84.2 %  Total Recording Time: 466.0 min Sleep Latency (from Lights Off) 44.5 min  Total Sleep Time (TST): 392.5 min R Latency (from Sleep Onset): 213.0 min  Sleep Period Time: 421.0 min Total number of awakenings: 7  Wake during sleep: 28.5 min Wake After Sleep Onset (WASO): 29.0 min   Sleep Data:         Arousal Summary: Stage  Latency from lights out (min) Latency from sleep onset (min) Duration (min) % Total Sleep Time  Normal values  N 1 44.5 0.0 6.5 1.7 (5%)  N 2 45.0 0.5 350.5 89.3 (50%)  N 3       0.0 0.0 (20%)  R 257.5 213.0 35.5 9.0 (25%)    Number Index  Spontaneous 24 3.7  Apneas & Hypopneas 28 4.3  RERAs 0 0.0       (Apneas & Hypopneas & RERAs)  (28) (4.3)  Limb Movement 2 0.3  Snore 0 0.0  TOTAL 54 8.3     Respiratory Data:  CA OA MA Apnea Hypopnea* A+ H RERA Total  Number 0 529 0 529 33 562 0 562  Mean Dur (sec) 0.0 18.2 0.0 18.2 34.8 19.2 0.0 19.2  Max Dur (sec) 0.0 52.0 0.0 52.0 74.0 74.0 0.0 74.0  Total Dur (min) 0.0 160.3 0.0 160.3 19.1 179.5 0.0 179.5  % of TST 0.0 40.8 0.0 40.8 4.9 45.7 0.0 45.7  Index (#/h TST) 0.0 80.9 0.0 80.9 5.0 85.9 0.0 85.9  *Hypopneas scored based on 4% or greater desaturation.  Sleep Stage:     Body Position Data:   REM NREM TST  AHI 77.7 86.7 85.9  RDI 77.7 86.7 85.9         Sleep (min)  TST (%) REM (min) NREM (min) CA (#) OA (#) MA (#) HYP (#) AHI (#/h) RERA (#) RDI (#/h) Desat (#)  Supine    0.00                      0 0.00     Non-Supine 392.50 100.00 35.50 357.00 0.00 529.00 0.00 33.00 85.91 0 85.91 232.00  Left: 327.5 83.44 35.5 292.0 0 456 0 31 89.2 0 89.2 224  Right: 65.0 16.56 0.0 65.0 0 73 0 2 69.2 0 69.2 8       Snoring: Total number of snoring episodes  0  Total time with snoring    min (   % of sleep)   Oximetry Distribution:  WK REM NREM TOTAL  Average (%)   94 90 93 93  < 90% 1.5 10.3 12.4 24.2  < 80% 0.0 2.0 0.3 2.3  < 70% 0.0 0.0 0.0 0.0  # of Desaturations* 0 44 188 232  Desat Index (#/hour) 0.0 74.4 31.6 35.5  Desat Max (%) 0 24 20 24   Desat Max Dur (sec) 0.0 75.0 108.0 108.0  Approx Min O2 during sleep 71  Approx min O2 during a respiratory event 71  Was Oxygen added (Y/N) and final rate No:   0 LPM  *Desaturations based on 4% or greater drop from baseline.   Cheyne Stokes Breathing: None Present   Hypoventilation: None Present    Heart Rate Summary:  Average Heart Rate During Sleep 57.6 bpm      Highest Heart Rate During Sleep (95th %) 67.0 bpm      Highest Heart Rate During Sleep 199 bpm (artifact)  Highest Heart Rate During Recording (TIB) 216 bpm (artifact)   Heart Rate Observations: Event Type # Events   Bradycardia 0 Lowest HR Scored: N/A  Sinus Tachycardia During Sleep 0 Highest HR Scored: N/A  Narrow Complex Tachycardia 0 Highest HR Scored: N/A  Wide Complex Tachycardia 0 Highest HR Scored: N/A  Asystole 0 Longest Pause: N/A  Atrial Fibrillation 0 Duration Longest Event: N/A  Other Arrythmias  No Type:    Periodic Limb Movement Data: (Primary legs unless otherwise noted) Total # Limb Movement 179 Limb Movement Index 27.4  Total # PLMS 166 PLMS Index 25.4  Total # PLMS Arousals 1 PLMS Arousal Index 0.2  Percentage Sleep Time with PLMS 68.32min (17.3 % sleep)  Mean Duration limb movements (secs) 291.8

## 2022-03-15 ENCOUNTER — Encounter (INDEPENDENT_AMBULATORY_CARE_PROVIDER_SITE_OTHER): Payer: Medicare PPO | Admitting: Internal Medicine

## 2022-03-15 DIAGNOSIS — G4733 Obstructive sleep apnea (adult) (pediatric): Secondary | ICD-10-CM

## 2022-03-17 NOTE — Procedures (Signed)
[place] Report Part I  Phone: [phone] Fax: [phone]  Patient Name: Tamara [name], Tamara [name] Acquisition Number: [id number]  Date of Birth: 07-19-1941 Acquisition Date: 03/15/2022  Referring Physician: Allyne Gee, MD     History: The patient is a 81 year old female with obstructive sleep apnea for CPAP titration. Medical History: anemia, arthritis, asthma, CHF, chronic kidney disease, diabetes, diverticulitis, dysrhythmia, gout, hepatitis, hypertension, hypothyroidism, neuropathy, OSA and SVT.  Medications: acetaminophen, asorbic acid, aspirin, biotin, carvedilol, furosemide, glipizide, janumet XR, jardiance, nitroglycerin, simvastatin, synthroid, vitamin B12, Vitamin D and ondansetron.  Procedure: This routine overnight polysomnogram was performed on the Alice 4 or 5 using the standard CPAP/BIPAP protocol. This included 6 channels of EEG, 2 channels of EOG, chin EMG, bilateral anterior tibialis EMG, nasal/oral thermister, PTAF (nasal pressure transducer), chest and abdominal wall movements, EKG, and pulse oximetry.  Description: The total recording time was 379.0 minutes. The total sleep time was 248.0 minutes. There were a total of 113.3 minutes of wakefulness after sleep onset for a reducedsleep efficiency of 65.4%. The latency to sleep onset was within normal limits at 17.7 minutes. The R sleep onset latency was within normal limits at 79.5 minutes.Sleep parameters, as a percentage of the total sleep time, demonstrated 24.0% of sleep was in N1 sleep, 32.7% N2, 20.8% N3 and 22.6% R sleep. There were a total of 246 arousals for an arousal index of 59.5 arousals per hour of sleep that was elevated.  Overall, there were a total of 63 respiratory events for a respiratory disturbance index, which includes apneas, hypopneas and RERAs (increased respiratory effort) of 15.2 respiratory events per hour of sleep during the pressure titration. CPAP was initiated at 4 cm  of H2O at lights out, 12:19:33 AM. It was titrated in 1-2 cm increments to the final pressure of 20 cm of H2O.   Additionally, the baseline oxygen saturation during wakefulness was 95%, during NREM sleep averaged 94%, and during REM sleep averaged 93%. The total duration of oxygen < 90% was 1.5 minutes.  Cardiac monitoring- did not demonstrate transient cardiac decelerations associated with the apneas. There were no significant cardiac rhythm irregularities.   Periodic limb movement monitoring- demonstrated that there were 175 periodic limb movements for a periodic limb movement index of 42.3 periodic limb movements per hour of sleep.   Impression: This patient's obstructive sleep apnea demonstrated significant improvement with the utilization of nasal CPAP.   There was a significantly elevated periodic limb movement index of 42.3 periodic limb movements per hour of sleep.   Recommendations:  CPAP titration study is adequate to control the patients OSA on a pressure of 18CWP Nasal Decongestants and antihistamines may be of help for increased upper airway resistance. Weight loss through dietary and lifestyle modifications to include exercise is recommended in the presence of obesity. Alternative treatment options if the patient is not willing or is unable to use CPAP include oral appliances as well as surgical intervention in the right clinical setting. Clinical correlation is recommended. Please feel free to call the office for any further questions or assistance in the care of this patient.    Allyne Gee, MD [facility] Diplomate ABMS Pulmonary Critical Care Medicine Sleep Medicine Electronically reviewed and digitally signed    [place] CPAP/BIPAP Polysomnogram Report Part II Phone: [phone] Fax: [phone]  Patient last name Amey Neck Size 15.0 in. Acquisition [id number]  Patient first name Draizy Weight 252.0 lbs. Started  03/15/2022 at 12:11:45 AM  Birth date  Apr 28, 1941 Height 62.0 in. Stopped 03/15/2022 at 7:37:33 AM  Age 68      Type Adult BMI 46.1 lb/in2 Duration 379.0  Report generated by Otho Perl, RPSGT Sleep Data: Lights Out: 12:19:33 AM Sleep Onset: 12:37:15 AM  Lights On: 6:38:33 AM Sleep Efficiency: 65.4 %  Total Recording Time: 379.0 min Sleep Latency (from Lights Off) 17.7 min  Total Sleep Time (TST): 248.0 min R Latency (from Sleep Onset): 79.5 min  Sleep Period Time: 357.5 min Total number of awakenings: 37  Wake during sleep: 109.5 min Wake After Sleep Onset (WASO): 113.3 min   Sleep Data:         Arousal Summary: Stage  Latency from lights out (min) Latency from sleep onset (min) Duration (min) % Total Sleep Time  Normal values  N 1 17.7 0.0 59.5 24.0 (5%)  N 2 26.7 9.0 81.0 32.7 (50%)  N 3 45.7 28.0 51.5 20.8 (20%)  R 97.2 79.5 56.0 22.6 (25%)    Number Index  Spontaneous 131 31.7  Apneas & Hypopneas 58 14.0  RERAs 0 0.0       (Apneas & Hypopneas & RERAs)  (58) (14.0)  Limb Movement 57 13.8  Snore 0 0.0  TOTAL 246 59.5     Respiratory Data:  CA OA MA Apnea Hypopnea* A+ H RERA Total  Number 0 8 15 23  40 63 0 63  Mean Dur (sec) 0.0 15.4 22.0 19.7 21.5 20.8 0.0 20.8  Max Dur (sec) 0.0 24.5 41.5 41.5 31.0 41.5 0.0 41.5  Total Dur (min) 0.0 2.1 5.5 7.5 14.3 21.9 0.0 21.9  % of TST 0.0 0.8 2.2 3.0 5.8 8.8 0.0 8.8  Index (#/h TST) 0.0 1.9 3.6 5.6 9.7 15.2 0.0 15.2  *Hypopneas scored based on 4% or greater desaturation.  Sleep Stage:         Body Position Data:    REM NREM TST  AHI 34.3 9.4 15.2  RDI 34.3 9.4 15.2    Sleep (min) TST (%) REM (min) NREM (min) CA (#) OA (#) MA (#) HYP (#) AHI (#/h) RERA (#) RDI (#/h) Desat (#)  Supine 78.7 31.73 29.1 49.6 0 8 9 13  22.9 0 22.9 35  Non-Supine 169.30 68.27 26.90 142.40 0.00 0.00 6.00 27.00 11.70 0 11.70 28.00  Left: 169.3 68.27 26.9 142.4 0 0 6 27 11.7 0 11.7 28       Snoring: Total number of snoring episodes  0  Total time with snoring    min  (   % of sleep)   Oximetry Distribution:             WK REM NREM TOTAL  Average (%)   95 93 94 94  < 90% 0.0 1.5 0.0 1.5  < 80% 0.0 0.0 0.0 0.0  < 70% 0.0 0.0 0.0 0.0  # of Desaturations* 3 28 23  54  Desat Index (#/hour) 1.4 30.0 7.2 13.1  Desat Max (%) 5 9 7 9   Desat Max Dur (sec) 37.0 72.0 97.0 97.0  Approx Min O2 during sleep 85  Approx min O2 during a respiratory event 85  Was Oxygen added (Y/N) and final rate No:   0 LPM  *Desaturations based on 3% or greater drop from baseline.   Cheyne Stokes Breathing: None Present    Heart Rate Summary:  Average Heart Rate During Sleep 56.5 bpm      Highest Heart Rate During Sleep (95th %)  69.0 bpm      Highest Heart Rate During Sleep 255 bpm (artifact)  Highest Heart Rate During Recording (TIB) 255 bpm (artifact)   Heart Rate Observations: Event Type # Events   Bradycardia 0 Lowest HR Scored: N/A  Sinus Tachycardia During Sleep 0 Highest HR Scored: N/A  Narrow Complex Tachycardia 0 Highest HR Scored: N/A  Wide Complex Tachycardia 0 Highest HR Scored: N/A  Asystole 0 Longest Pause: N/A  Atrial Fibrillation 0 Duration Longest Event: N/A  Other Arrythmias  No Type:   Periodic Limb Movement Data: (Primary legs unless otherwise noted) Total # Limb Movement 190 Limb Movement Index 46.0  Total # PLMS 175 PLMS Index 42.3  Total # PLMS Arousals 54 PLMS Arousal Index 13.1  Percentage Sleep Time with PLMS 61.17min (24.9 % sleep)  Mean Duration limb movements (secs) 462.8   Brief Summary:     Starting pressure:  cm of H2O Maximum Pressure:  cm of H2O  Mask Type:  Mask Size:   Humidifier used:  Chin Strap used:   CFlex:  BiFlex:     IPAP Level (cmH2O) EPAP Level (cmH2O) Total Duration (min) Sleep Duration (min) Sleep (%) REM (%) CA  #) OA # MA # HYP #) AHI (#/hr) RERAs # RERAs (#/hr) RDI (#/hr)  4 4 18.4 13.4 72.8 0.0 0 0 0 10 44.8 0 0.0 44.8  6 6  6.6 6.6 100.0 0.0 0 0 0 0 0.0 0 0.0 0.0  7 7 8.3 8.3 100.0 0.0 0 0 0 0 0.0 0 0.0 0.0   8 8 48.5 48.5 100.0 7.0 0 0 2 5 8.7 0 0.0 8.7  10 10  4.5 4.0 88.9 84.4 0 0 0 3 45.0 0 0.0 45.0  11 11 7.3 7.3 100.0 87.7 0 0 0 3 24.7 0 0.0 24.7  12 12  9.6 9.3 96.9 91.7 0 0 0 2 12.9 0 0.0 12.9  13 13  12.5 5.1 40.8 0.0 0 0 1 0 11.8 0 0.0 11.8  14 14  110.7 77.6 70.1 9.9 0 5 8 13  20.1 0 0.0 20.1  16 16  26.2 8.5 32.4 0.0 0 2 1 0 21.2 0 0.0 21.2  17 17  28.8 11.0 38.2 0.0 0 1 2 0 16.4 0 0.0 16.4  18 18  23.2 22.7 97.8 74.6 0 0 1 1 5.3 0 0.0 5.3  19 19  25.0 14.7 58.8 17.6 0 0 0 3 12.2 0 0.0 12.2  20 19  2.8 2.2 78.6 0.0 0 0 0 0 0.0 0 0.0 0.0  20 20 19.1 5.5 28.8 0.0 0 0 0 0 0.0 0 0.0 0.0

## 2022-05-18 ENCOUNTER — Ambulatory Visit: Payer: Medicare PPO | Admitting: Nurse Practitioner

## 2022-10-13 ENCOUNTER — Encounter: Payer: Self-pay | Admitting: Emergency Medicine

## 2022-10-13 ENCOUNTER — Ambulatory Visit
Admission: EM | Admit: 2022-10-13 | Discharge: 2022-10-13 | Disposition: A | Discharge status: Home / Self Care | Payer: Medicare PPO | Attending: Emergency Medicine | Admitting: Emergency Medicine

## 2022-10-13 DIAGNOSIS — K0889 Other specified disorders of teeth and supporting structures: Secondary | ICD-10-CM | POA: Diagnosis not present

## 2022-10-13 MED ORDER — TRAMADOL HCL 50 MG PO TABS: 50.0000 mg | ORAL_TABLET | Freq: Four times a day (QID) | ORAL | 0 refills | Status: AC | PRN

## 2022-10-13 MED ORDER — AMOXICILLIN-POT CLAVULANATE 875-125 MG PO TABS: 1.0000 | ORAL_TABLET | Freq: Two times a day (BID) | ORAL | 0 refills | Status: DC

## 2022-10-13 NOTE — Discharge Instructions (Signed)
Today you are being treated for dental pain  Begin Augmentin taking every morning and every evening for 7 days to clear any infection  You may continue use of ibuprofen, may take tramadol every 6 hours as needed for severe pain, be mindful this medication may make you feel drowsy  May attempt use of salt water rinses, Listerine rinses, throat lozenges, warm liquids and soft foods for additional comfort  Continue to reach out to dentist for reevaluation

## 2022-10-13 NOTE — ED Provider Notes (Signed)
[facility]    CSN: [id number] Arrival date & time: 10/13/22  0947      History   Chief Complaint Chief Complaint  Patient presents with   Dental Problem    HPI Tamara [name] is a 82 y.o. female.   Patient presents for concerns about a abscessed tooth to the right lower gumline beginning 1 day ago.  Has begun to experience severe pain interfering with sleep.  Pain radiating to the right ear.  Has experienced symptoms similar in the past.  Attempted to reach out to 2 dental offices today but unable to be seen.  Has been managing pain with ibuprofen which has been ineffective.  Denies drainage or fevers.    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   Bronchitis    Cardiomegaly    CHF (congestive heart failure) (HCC)    stopped using diuretics due to kidney issues, watching salt intake   Chronic cough    Chronic kidney disease    due to DM   Complication of anesthesia    BP bottomed out during Achilles Tendon repair >44yr ago   Constipation due to pain medication    Diabetes mellitus without complication (HCC)    type 2  not on medications   Diverticulitis    Dysrhythmia    Gout    HAP (hospital-acquired pneumonia) 05/16/2016   Headache    Sinus headaches   Hepatitis    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   History of blood transfusion    Hypertension    Hypothyroidism    Neuropathy due to secondary diabetes mellitus ([place]    feet   NSTEMI (non-ST elevated myocardial infarction) ([facility]    Obesity    Palpitations    Pneumonia    hx of- 05/02/16- last time approx - 6 years ago   Sepsis associated hypotension ([facility] 05/16/2016   Shortness of breath dyspnea    with exertion   Sleep apnea    does not use CPAP   SVT (supraventricular tachycardia)    Wheezing     Patient Active Problem List   Diagnosis Date Noted   Possible  Aspiration into respiratory tract 11/23/2021   Acute gastroenteritis 11/22/2021   Chronic diastolic CHF (congestive heart failure) ([facility] 11/22/2021   History of supraventricular tachycardia 11/22/2021   Chronic kidney disease, stage 3a ([facility] 11/22/2021   Obesity, Class III, BMI 40-49.9 (morbid obesity) ([facility] 12/30/2016   Primary localized osteoarthritis of left knee 12/29/2016   Hypothyroidism 12/29/2016   Diabetes mellitus ([facility] 12/29/2016   CHF with left ventricular diastolic dysfunction, NYHA class 2 ([facility] 12/29/2016   Yeast infection of the skin 12/29/2016   Hypertension 12/29/2016   Sepsis associated hypotension ([facility] 05/16/2016   Acute respiratory failure with hypoxia ([facility] 05/16/2016   Primary localized osteoarthritis of right knee 05/12/2016   Paroxysmal SVT (supraventricular tachycardia) 03/01/2016   Preprocedural cardiovascular examination 03/01/2016    Past Surgical History:  Procedure Laterality Date   ABDOMINAL HYSTERECTOMY     ABDOMINAL HYSTERECTOMY  1984   ACHILLES TENDON REPAIR Left    [place] 05/18[place]2016   Procedure: Left Heart Cath and Coronary Angiography;  Surgeon: CBurnell Blanks MD;  [facility];  Service: Cardiovascular;  Laterality: N/A;   CATARACT EXTRACTION W/PHACO Left 02/10/2015   Procedure: CATARACT EXTRACTION PHACO AND INTRAOCULAR LENS PLACEMENT (IOC);  Surgeon: Leandrew Koyanagi, MD;  Location: [place];  Service: Ophthalmology;  Laterality: Left;  DIABETIC   CATARACT EXTRACTION W/PHACO Right 02/24/2015   Procedure: CATARACT EXTRACTION PHACO AND INTRAOCULAR LENS PLACEMENT (IOC);  Surgeon: Leandrew Koyanagi, MD;  Location: [facility];  Service: Ophthalmology;  Laterality: Right;   [place]   EYE SURGERY Bilateral    Cataract    TOTAL KNEE ARTHROPLASTY Right 05/12/2016   Procedure: TOTAL KNEE ARTHROPLASTY;   Surgeon: Earlie Server, MD;  Location: [place];  Service: Orthopedics;  Laterality: Right;   TOTAL KNEE ARTHROPLASTY Left 12/29/2016   Procedure: TOTAL KNEE ARTHROPLASTY;  Surgeon: Earlie Server, MD;  Location: [place];  Service: Orthopedics;  Laterality: Left;    OB History   No obstetric history on file.      Home Medications    Prior to Admission medications   Medication Sig Start Date End Date Taking? Authorizing Provider  ACCU-CHEK GUIDE test strip 2 (two) times daily. 08/31/21   [provider]  Accu-Chek Softclix Lancets lancets SMARTSIG:Topical 09/13/21   [provider]  acetaminophen (TYLENOL) 500 MG tablet Take 1,000 mg by mouth every 8 (eight) hours as needed for mild pain.    [provider]  Ascorbic Acid (VITAMIN C WITH ROSE HIPS) 1000 MG tablet Take 1,000 mg by mouth daily.    [provider]  aspirin EC 81 MG tablet Take 81 mg by mouth daily. Swallow whole.    [provider]  Biotin w/ Vitamins C & E (HAIR/SKIN/NAILS PO) Take 2 tablets by mouth daily.    [provider]  carvedilol (COREG) 3.125 MG tablet Take 3.125 mg by mouth 2 (two) times daily with a meal.    [provider]  COMBIVENT RESPIMAT 20-100 MCG/ACT AERS respimat Inhale 1 puff into the lungs every 6 (six) hours as needed. 09/06/21   [provider]  furosemide (LASIX) 40 MG tablet Take 40 mg by mouth daily. 09/01/21   [provider]  glipiZIDE (GLUCOTROL XL) 2.5 MG 24 hr tablet Take 2.5 mg by mouth daily. 03/13/17   [provider]  JANUMET XR [id number] MG TB24 Take 1 tablet by mouth daily. 03/14/17   [provider]  JARDIANCE 25 MG TABS tablet Take 25 mg by mouth every morning. 09/13/21   [provider]  nitroGLYCERIN (NITROSTAT) 0.4 MG SL tablet Place 0.4 mg under the tongue every 5 (five) minutes as needed for chest pain. Max 3    [provider]  ondansetron (ZOFRAN-ODT) 4 MG disintegrating tablet  Take by mouth. 11/28/21   [provider]  simvastatin (ZOCOR) 20 MG tablet Take 20 mg by mouth daily. 09/14/21   [provider]  SYNTHROID 75 MCG tablet Take 75 mcg by mouth daily before breakfast. 10/25/21   [provider]  vitamin B-12 (CYANOCOBALAMIN) 1000 MCG tablet Take 1,000 mcg by mouth daily.    [provider]  Vitamin D, Ergocalciferol, (DRISDOL) 1.25 MG (50000 UNIT) CAPS capsule Take 50,000 Units by mouth once a week. 10/08/21   [provider]  Zinc 50 MG CAPS Take 1 each by mouth once.    [provider]    Family History Family History  Problem Relation Age of Onset   Lung cancer Mother  Heart attack Mother    Heart Problems Father    Hypertension Father    Other Father        respiratory problems   Ovarian cancer Maternal Grandmother    Cancer Maternal Grandfather    Stroke Paternal Grandmother    Diabetes Brother    Liver cancer Brother    COPD Sister    Other Sister        sepsis   Heart attack Son    Heart attack Son    Rheum arthritis Daughter    Rheum arthritis Daughter    Thyroid cancer Daughter    Hepatitis C Daughter     Social History Social History   Tobacco Use   Smoking status: Never   Smokeless tobacco: Never  Vaping Use   Vaping Use: Never used  Substance Use Topics   Alcohol use: No   Drug use: No     Allergies   Codeine   Review of Systems Review of Systems   Physical Exam Triage Vital Signs ED Triage Vitals  Enc Vitals Group     BP 10/13/22 0957 138/66     Pulse Rate 10/13/22 0957 64     Resp 10/13/22 0957 14     Temp 10/13/22 0957 97.9 F (36.6 C)     Temp Source 10/13/22 0957 Oral     SpO2 10/13/22 0957 95 %     Weight 10/13/22 0955 229 lb (103.9 kg)     Height 10/13/22 0955 5' 2"$  (1.575 m)     Head Circumference --      Peak Flow --      Pain Score 10/13/22 0955 7     Pain Loc --      Pain Edu? --      Excl. in [place]? --    No data found.  Updated Vital  Signs BP 138/66 (BP Location: Left Arm)   Pulse 64   Temp 97.9 F (36.6 C) (Oral)   Resp 14   Ht 5' 2"$  (1.575 m)   Wt 229 lb (103.9 kg)   SpO2 95%   BMI 41.88 kg/m   Visual Acuity Right Eye Distance:   Left Eye Distance:   Bilateral Distance:    Right Eye Near:   Left Eye Near:    Bilateral Near:     Physical Exam Constitutional:      Appearance: Normal appearance.  HENT:     Mouth/Throat:     Mouth: Mucous membranes are moist.     Pharynx: Oropharynx is clear.      Comments: Broken tooth with dental decay present to the right lower gumline with mild to moderate gingival swelling, no abscess noted, pharynx is clear without obstruction Eyes:     Extraocular Movements: Extraocular movements intact.  Pulmonary:     Effort: Pulmonary effort is normal.  Neurological:     Mental Status: She is alert.      UC Treatments / Results  Labs (all labs ordered are listed, but only abnormal results are displayed) Labs Reviewed - No data to display  EKG   Radiology No results found.  Procedures Procedures (including critical care time)  Medications Ordered in UC Medications - No data to display  Initial Impression / Assessment and Plan / UC Course  I have reviewed the triage vital signs and the nursing notes.  Pertinent labs & imaging results that were available during my care of the patient were reviewed by me and considered in my  medical decision making (see chart for details).  Dental pain  Will provide antibiotic prophylactically, Augmentin prescribed, prescribed tramadol for severe pain, advised to use ibuprofen initially, given written handout of supportive measures and advised to continue to reach out to dentist for further evaluation and management, PDMP reviewed, low risk Final Clinical Impressions(s) / UC Diagnoses   Final diagnoses:  None   Discharge Instructions   None    ED Prescriptions   None    PDMP not reviewed this encounter.   Hans Eden, NP 10/13/22 1015

## 2022-10-13 NOTE — ED Triage Notes (Signed)
Patient reports right lower tooth pain that started yesterday.  Patient denies injury.

## 2022-10-21 ENCOUNTER — Emergency Department: Payer: Medicare PPO

## 2022-10-21 ENCOUNTER — Other Ambulatory Visit: Payer: Self-pay

## 2022-10-21 ENCOUNTER — Emergency Department
Admission: EM | Admit: 2022-10-21 | Discharge: 2022-10-21 | Disposition: A | Discharge status: Home / Self Care | Payer: Medicare PPO | Attending: Emergency Medicine | Admitting: Emergency Medicine

## 2022-10-21 DIAGNOSIS — R918 Other nonspecific abnormal finding of lung field: Secondary | ICD-10-CM | POA: Diagnosis not present

## 2022-10-21 DIAGNOSIS — K5730 Diverticulosis of large intestine without perforation or abscess without bleeding: Secondary | ICD-10-CM | POA: Insufficient documentation

## 2022-10-21 DIAGNOSIS — R531 Weakness: Secondary | ICD-10-CM | POA: Insufficient documentation

## 2022-10-21 DIAGNOSIS — R109 Unspecified abdominal pain: Secondary | ICD-10-CM | POA: Diagnosis not present

## 2022-10-21 DIAGNOSIS — Z1152 Encounter for screening for COVID-19: Secondary | ICD-10-CM | POA: Insufficient documentation

## 2022-10-21 DIAGNOSIS — K760 Fatty (change of) liver, not elsewhere classified: Secondary | ICD-10-CM | POA: Diagnosis not present

## 2022-10-21 DIAGNOSIS — I700 Atherosclerosis of aorta: Secondary | ICD-10-CM | POA: Diagnosis not present

## 2022-10-21 DIAGNOSIS — R6883 Chills (without fever): Secondary | ICD-10-CM | POA: Insufficient documentation

## 2022-10-21 DIAGNOSIS — N200 Calculus of kidney: Secondary | ICD-10-CM | POA: Insufficient documentation

## 2022-10-21 LAB — TROPONIN I (HIGH SENSITIVITY)
Troponin I (High Sensitivity): 5 ng/L (ref <18)
Troponin I (High Sensitivity): 5 ng/L (ref <18)

## 2022-10-21 LAB — RESP PANEL BY RT-PCR (RSV, FLU A&B, COVID)  RVPGX2
Influenza A by PCR: NEGATIVE
Influenza B by PCR: NEGATIVE
Resp Syncytial Virus by PCR: NEGATIVE
SARS Coronavirus 2 by RT PCR: NEGATIVE

## 2022-10-21 LAB — COMPREHENSIVE METABOLIC PANEL
ALT: 15 U/L (ref 0–44)
AST: 18 U/L (ref 15–41)
Albumin: 3.3 g/dL — ABNORMAL LOW (ref 3.5–5.0)
Alkaline Phosphatase: 45 U/L (ref 38–126)
Anion gap: 9 (ref 5–15)
BUN: 22 mg/dL (ref 8–23)
CO2: 27 mmol/L (ref 22–32)
Calcium: 9 mg/dL (ref 8.9–10.3)
Chloride: 100 mmol/L (ref 98–111)
Creatinine, Ser: 1.14 mg/dL — ABNORMAL HIGH (ref 0.44–1.00)
GFR, Estimated: 48 mL/min — ABNORMAL LOW (ref >60)
Glucose, Bld: 150 mg/dL — ABNORMAL HIGH (ref 70–99)
Potassium: 3.7 mmol/L (ref 3.5–5.1)
Sodium: 136 mmol/L (ref 135–145)
Total Bilirubin: 0.7 mg/dL (ref 0.3–1.2)
Total Protein: 7.6 g/dL (ref 6.5–8.1)

## 2022-10-21 LAB — URINALYSIS, ROUTINE W REFLEX MICROSCOPIC
Bacteria, UA: NONE SEEN
Bilirubin Urine: NEGATIVE
Glucose, UA: >=500 mg/dL — AB
Ketones, ur: NEGATIVE mg/dL
Leukocytes,Ua: NEGATIVE
Nitrite: NEGATIVE
Protein, ur: NEGATIVE mg/dL
Specific Gravity, Urine: 1.028 (ref 1.005–1.030)
pH: 6 (ref 5.0–8.0)

## 2022-10-21 LAB — CBC
HCT: 41.5 % (ref 36.0–46.0)
Hemoglobin: 13.2 g/dL (ref 12.0–15.0)
MCH: 28.3 pg (ref 26.0–34.0)
MCHC: 31.8 g/dL (ref 30.0–36.0)
MCV: 88.9 fL (ref 80.0–100.0)
Platelets: 291 10*3/uL (ref 150–400)
RBC: 4.67 MIL/uL (ref 3.87–5.11)
RDW: 14 % (ref 11.5–15.5)
WBC: 8.1 10*3/uL (ref 4.0–10.5)
nRBC: 0 % (ref 0.0–0.2)

## 2022-10-21 LAB — LIPASE, BLOOD: Lipase: 36 U/L (ref 11–51)

## 2022-10-21 LAB — LACTIC ACID, PLASMA: Lactic Acid, Venous: 1.4 mmol/L (ref 0.5–1.9)

## 2022-10-21 MED ORDER — KETOROLAC TROMETHAMINE 15 MG/ML IJ SOLN
7.5000 mg | Freq: Once | INTRAMUSCULAR | Status: AC
  Administered 2022-10-21: 7.5 mg via INTRAVENOUS
  Filled 2022-10-21: qty 1

## 2022-10-21 MED ORDER — SODIUM CHLORIDE 0.9 % IV BOLUS
1000.0000 mL | Freq: Once | INTRAVENOUS | Status: AC
  Administered 2022-10-21: 1000 mL via INTRAVENOUS

## 2022-10-21 MED ORDER — PANTOPRAZOLE SODIUM 40 MG IV SOLR
40.0000 mg | Freq: Once | INTRAVENOUS | Status: AC
  Administered 2022-10-21: 40 mg via INTRAVENOUS
  Filled 2022-10-21: qty 10

## 2022-10-21 MED ORDER — ONDANSETRON HCL 4 MG/2ML IJ SOLN
4.0000 mg | Freq: Once | INTRAMUSCULAR | Status: AC
  Administered 2022-10-21: 4 mg via INTRAVENOUS
  Filled 2022-10-21: qty 2

## 2022-10-21 MED ORDER — IOHEXOL 300 MG/ML  SOLN
100.0000 mL | Freq: Once | INTRAMUSCULAR | Status: AC | PRN
  Administered 2022-10-21: 100 mL via INTRAVENOUS

## 2022-10-21 NOTE — ED Provider Notes (Signed)
[facility] Provider Note    Event Date/Time   First MD Initiated Contact with Patient 10/21/22 [phone]     (approximate)   History   Chief Complaint: Weakness   HPI  Tamara [name] is a 82 y.o. female who comes to the ED due to worsening generalized weakness/fatigue for the last 3 days, gradual onset.  She did have some decreased oral intake and diarrhea around the beginning of the symptoms.  She recently completed a course of Augmentin for abscessed tooth 2 or 3 days ago.  No dizziness or syncope, no falls, no trauma.  No chest pain or shortness of breath.  Does have some upper abdominal discomfort.  Patient does report a history of paroxysmal SVT which normally resolves with Valsalva maneuvers at home.  Does not take any routine AV nodal blockers or other heart rate control medications.     Physical Exam   Triage Vital Signs: ED Triage Vitals  Enc Vitals Group     BP 10/21/22 0919 (!) 119/47     Pulse Rate 10/21/22 0919 65     Resp 10/21/22 0919 15     Temp 10/21/22 0919 98.2 F (36.8 C)     Temp Source 10/21/22 0919 Oral     SpO2 10/21/22 0919 94 %     Weight 10/21/22 0921 245 lb (111.1 kg)     Height 10/21/22 0921 '5\' 2"'$  (1.575 m)     Head Circumference --      Peak Flow --      Pain Score 10/21/22 0921 0     Pain Loc --      Pain Edu? --      Excl. in [place]? --     Most recent vital signs: Vitals:   10/21/22 1230 10/21/22 1330  BP: 110/84 (!) 114/40  Pulse:  66  Resp: (!) 21 (!) 23  Temp:    SpO2:      General: Awake, no distress.  CV:  Good peripheral perfusion.  Regular rate and rhythm, heart rate 60.  Normal distal pulses. Resp:  Normal effort.  Clear to auscultation bilaterally Abd:  No distention.  Soft with mild diffuse upper abdominal tenderness. Other:  Moist oral mucosa.  No lower extremity edema, no inflammatory soft tissue changes.   ED Results / Procedures / Treatments   Labs (all labs ordered are listed, but only  abnormal results are displayed) Labs Reviewed  URINALYSIS, ROUTINE W REFLEX MICROSCOPIC - Abnormal; Notable for the following components:      Result Value   Color, Urine YELLOW (*)    APPearance HAZY (*)    Glucose, UA >=500 (*)    Hgb urine dipstick SMALL (*)    All other components within normal limits  COMPREHENSIVE METABOLIC PANEL - Abnormal; Notable for the following components:   Glucose, Bld 150 (*)    Creatinine, Ser 1.14 (*)    Albumin 3.3 (*)    GFR, Estimated 48 (*)    All other components within normal limits  RESP PANEL BY RT-PCR (RSV, FLU A&B, COVID)  RVPGX2  CBC  LACTIC ACID, PLASMA  LIPASE, BLOOD  TROPONIN I (HIGH SENSITIVITY)  TROPONIN I (HIGH SENSITIVITY)     EKG Interpreted by me Sinus rhythm, rate of 59.  Left axis, right bundle branch block.  No acute ischemic changes.   RADIOLOGY Chest x-ray interpreted by me, appears normal without effusion edema or consolidation.  Small nodular density in the right lower  lung.  No pneumothorax.  Radiology report reviewed.   PROCEDURES:  Procedures   MEDICATIONS ORDERED IN ED: Medications  ondansetron (ZOFRAN) injection 4 mg (4 mg Intravenous Given 10/21/22 0957)  sodium chloride 0.9 % bolus 1,000 mL (0 mLs Intravenous Stopped 10/21/22 1113)  pantoprazole (PROTONIX) injection 40 mg (40 mg Intravenous Given 10/21/22 0958)  iohexol (OMNIPAQUE) 300 MG/ML solution 100 mL (100 mLs Intravenous Contrast Given 10/21/22 1132)  ketorolac (TORADOL) 15 MG/ML injection 7.5 mg (7.5 mg Intravenous Given 10/21/22 1250)     IMPRESSION / MDM / ASSESSMENT AND PLAN / ED COURSE  I reviewed the triage vital signs and the nursing notes.  DDx: Viral illness/flu/COVID, dehydration, electrolyte abnormality, AKI, Augmentin side effects, pancreatitis, gastritis, diverticulitis  Patient's presentation is most consistent with acute presentation with potential threat to life or bodily function.  Patient presents with upper abdominal pain  and generalized weakness over the past few days.  Vital signs in the ED are normal, she is nontoxic.  Will obtain labs, chest x-ray.   Clinical Course as of 10/21/22 1419  Sat Oct 21, 2022  1258 Workup is all reassuring.  Patient having chills, but vital signs are normal, temp is 98.2 currently.  Will give a low-dose of Toradol to help with inflammatory response which I think is due to a viral flulike illness even though COVID and flu are negative. [PS]    Clinical Course User Index [PS] Carrie Mew, MD    ----------------------------------------- 2:19 PM on 10/21/2022 ----------------------------------------- Chills resolved and patient feels much better after Toradol.  Recommend she continue taking Tylenol at home to control constitutional symptoms, focus on hydration and follow-up with her doctor this week.  Return precautions for any worsening symptoms.   FINAL CLINICAL IMPRESSION(S) / ED DIAGNOSES   Final diagnoses:  Generalized weakness     Rx / DC Orders   ED Discharge Orders     None        Note:  This document was prepared using Dragon voice recognition software and may include unintentional dictation errors.   Carrie Mew, MD 10/21/22 [phone]

## 2022-10-21 NOTE — ED Triage Notes (Signed)
Pt to ED with family member POV, has been feeling weak for 3 days, finished abx for abscessed tooth (augmentin) 2 days ago. Pt also complains that has hx SVT and feels like HR has been irregular. Radial pulse feels regular. Denies recent falls.   Pt is alert, oriented., Skin is warm and dry. Respirations unlabored.

## 2022-10-21 NOTE — Discharge Instructions (Addendum)
Your lab tests and CT scan of the abdomen were all okay today.  Continue taking Tylenol as needed for aches or chills, and follow-up with your doctor this week.  Be sure to drink plenty of fluids to stay hydrated.

## 2023-01-01 ENCOUNTER — Other Ambulatory Visit: Payer: Self-pay

## 2023-01-01 DIAGNOSIS — R0602 Shortness of breath: Secondary | ICD-10-CM

## 2023-01-03 ENCOUNTER — Ambulatory Visit: Payer: Medicare PPO | Admitting: Internal Medicine

## 2023-01-10 ENCOUNTER — Ambulatory Visit: Payer: Medicare PPO | Admitting: Internal Medicine

## 2023-01-18 ENCOUNTER — Ambulatory Visit: Payer: Medicare PPO | Admitting: Physician Assistant

## 2024-05-04 ENCOUNTER — Encounter: Payer: Self-pay | Admitting: Emergency Medicine

## 2024-05-04 ENCOUNTER — Ambulatory Visit
Admission: EM | Admit: 2024-05-04 | Discharge: 2024-05-04 | Disposition: A | Discharge status: Home / Self Care | Attending: Family Medicine | Admitting: Family Medicine

## 2024-05-04 DIAGNOSIS — U071 COVID-19: Secondary | ICD-10-CM

## 2024-05-04 DIAGNOSIS — J069 Acute upper respiratory infection, unspecified: Secondary | ICD-10-CM

## 2024-05-04 MED ORDER — PAXLOVID (150/100) 10 X 150 MG & 10 X 100MG PO TBPK: ORAL_TABLET | ORAL | 0 refills | Status: AC

## 2024-05-04 MED ORDER — BENZONATATE 100 MG PO CAPS: 100.0000 mg | ORAL_CAPSULE | Freq: Three times a day (TID) | ORAL | 0 refills | Status: AC

## 2024-05-04 NOTE — ED Triage Notes (Addendum)
 Patient c/o cough, runny nose, congestion and low grade fever that started yesterday.  Patient states that she did a home COVID test and was positive.   Patient brought her home COVID test result with her.

## 2024-05-04 NOTE — Discharge Instructions (Addendum)
 Hold your simvastatin  until 05/15/24 while taking your COVID treatment.    Your home test for COVID-19 was positive, meaning that you were infected with the novel coronavirus and could give the germ to others.  The recommendations suggest returning to normal activities when, for at least 24 hours, symptoms are improving overall, and if a fever was present, it has been gone without use of a fever-reducing medication.  You should wear a mask for the next 5 days to prevent the spread of disease. Please continue good preventive care measures, including:  frequent hand-washing, avoid touching your face, cover coughs/sneezes, stay out of crowds and keep a 6 foot distance from others.  Go to the nearest hospital emergency room if fever/cough/breathlessness are severe or illness seems like a threat to life.  If your were prescribed medication. Stop by the pharmacy to pick it up. You can take Tylenol  and/or Ibuprofen as needed for fever reduction and pain relief.    For cough: honey 1/2 to 1 teaspoon (you can dilute the honey in water or another fluid).  You can also use guaifenesin  and dextromethorphan for cough. You can use a humidifier for chest congestion and cough.  If you don't have a humidifier, you can sit in the bathroom with the hot shower running.      For sore throat: try warm salt water gargles, Mucinex  sore throat cough drops or cepacol lozenges, throat spray, warm tea or water with lemon/honey, popsicles or ice, or OTC cold relief medicine for throat discomfort. You can also purchase chloraseptic spray at the pharmacy or dollar store.   For congestion: take a daily anti-histamine like Zyrtec, Claritin, and a oral decongestant, such as pseudoephedrine.  You can also use Flonase  1-2 sprays in each nostril daily. Afrin is also a good option, if you do not have high blood pressure.    It is important to stay hydrated: drink plenty of fluids (water, gatorade/powerade/pedialyte, juices, or teas) to keep  your throat moisturized and help further relieve irritation/discomfort.    Return or go to the Emergency Department if symptoms worsen or do not improve in the next few days

## 2024-05-04 NOTE — ED Provider Notes (Signed)
 [facility]    CSN: [id number] Arrival date & time: 05/04/24  1247      History   Chief Complaint Chief Complaint  Patient presents with   Covid Positive   Cough    HPI Tamara [name] is a 83 y.o. female.   HPI  History obtained from the patient. [name] presents for 1 day of sneezing, coughing, headache,  nasal congestion, rhinorrhea and lower grade fever. Tmax 100.9 F.  Denies sore throat, vomiting and diarrhea. Her sister brought her a COVID and influenza test yesterday and it was COVID positive.  Her son ha been sick with a cough.   She reports history of asthma. Denies history of smoking.      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   Bronchitis    Cardiomegaly    CHF (congestive heart failure) (HCC)    stopped using diuretics due to kidney issues, watching salt intake   Chronic cough    Chronic kidney disease    due to DM   Complication of anesthesia    BP bottomed out during Achilles Tendon repair >40yrs ago   Constipation due to pain medication    Diabetes mellitus without complication (HCC)    type 2  not on medications   Diverticulitis    Dysrhythmia    Gout    HAP (hospital-acquired pneumonia) 05/16/2016   Headache    Sinus headaches   Hepatitis    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 old   History of blood transfusion    Hypertension    Hypothyroidism    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   NSTEMI (non-ST elevated myocardial infarction) (HCC)    Obesity    Palpitations    Pneumonia    hx of- 05/02/16- last time approx - 6 years ago   Sepsis associated hypotension (HCC) 05/16/2016   Shortness of breath dyspnea    with exertion   Sleep apnea    does not use CPAP   SVT (supraventricular tachycardia) (HCC)    Wheezing     Patient Active Problem List   Diagnosis Date Noted    Possible Aspiration into respiratory tract 11/23/2021   Acute gastroenteritis 11/22/2021   Chronic diastolic CHF (congestive heart failure) (HCC) 11/22/2021   History of supraventricular tachycardia 11/22/2021   Chronic kidney disease, stage 3a (HCC) 11/22/2021   Obesity, Class III, BMI 40-49.9 (morbid obesity) 12/30/2016   Primary localized osteoarthritis of left knee 12/29/2016   Hypothyroidism 12/29/2016   Diabetes mellitus (HCC) 12/29/2016   CHF with left ventricular diastolic dysfunction, NYHA class 2 (HCC) 12/29/2016   Yeast infection of the skin 12/29/2016   Hypertension 12/29/2016   Sepsis associated hypotension (HCC) 05/16/2016   Acute respiratory failure with hypoxia (HCC) 05/16/2016   Primary localized osteoarthritis of right knee 05/12/2016   Paroxysmal SVT (supraventricular tachycardia) (HCC) 03/01/2016   Preprocedural cardiovascular examination 03/01/2016    Past Surgical History:  Procedure Laterality Date   ABDOMINAL HYSTERECTOMY     ABDOMINAL HYSTERECTOMY  1984   ACHILLES TENDON REPAIR Left    CARDIAC CATHETERIZATION     [place]   CARDIAC CATHETERIZATION N/A 05/18/2016   Procedure: Left Heart Cath and Coronary Angiography;  Surgeon: Lonni JONETTA Cash, MD;  Location: [facility]  INVASIVE CV LAB;  Service: Cardiovascular;  Laterality: N/A;   CATARACT EXTRACTION W/PHACO Left 02/10/2015   Procedure: CATARACT EXTRACTION PHACO AND INTRAOCULAR LENS PLACEMENT (IOC);  Surgeon: Dene Etienne, MD;  Location: [facility];  Service: Ophthalmology;  Laterality: Left;  DIABETIC   CATARACT EXTRACTION W/PHACO Right 02/24/2015   Procedure: CATARACT EXTRACTION PHACO AND INTRAOCULAR LENS PLACEMENT (IOC);  Surgeon: Dene Etienne, MD;  Location: [facility];  Service: Ophthalmology;  Laterality: Right;   CESAREAN SECTION  1978   CHOLECYSTECTOMY  1986   EYE SURGERY Bilateral    Cataract    TOTAL KNEE ARTHROPLASTY Right 05/12/2016   Procedure: TOTAL KNEE  ARTHROPLASTY;  Surgeon: Toribio Silos, MD;  Location: [facility] OR;  Service: Orthopedics;  Laterality: Right;   TOTAL KNEE ARTHROPLASTY Left 12/29/2016   Procedure: TOTAL KNEE ARTHROPLASTY;  Surgeon: Silos Toribio, MD;  Location: [facility] OR;  Service: Orthopedics;  Laterality: Left;    OB History   No obstetric history on file.      Home Medications    Prior to Admission medications   Medication Sig Start Date End Date Taking? Authorizing Provider  benzonatate  (TESSALON ) 100 MG capsule Take 1 capsule (100 mg total) by mouth every 8 (eight) hours. 05/04/24  Yes Caeden Foots, DO  nirmatrelvir/ritonavir, renal dosing, (PAXLOVID , 150/100,) 10 x 150 MG & 10 x 100MG  TBPK GFR 50.  Dosage for moderate renal impairment (eGFR >/= 30 to <60 mL/min): 150 mg nirmatrelvir (one 150 mg tablet) with 100 mg ritonavir (one 100 mg tablet), with both tablets taken together twice daily for 5 days. 05/04/24  Yes Bret Stamour, DO  ACCU-CHEK GUIDE test strip 2 (two) times daily. 08/31/21   [provider]  Accu-Chek Softclix Lancets lancets SMARTSIG:Topical 09/13/21   [provider]  acetaminophen  (TYLENOL ) 500 MG tablet Take 1,000 mg by mouth every 8 (eight) hours as needed for mild pain.    [provider]  Ascorbic Acid (VITAMIN C WITH ROSE HIPS) 1000 MG tablet Take 1,000 mg by mouth daily.    [provider]  aspirin  EC 81 MG tablet Take 81 mg by mouth daily. Swallow whole.    [provider]  Biotin w/ Vitamins C & E (HAIR/SKIN/NAILS PO) Take 2 tablets by mouth daily.    [provider]  carvedilol  (COREG ) 3.125 MG tablet Take 3.125 mg by mouth 2 (two) times daily with a meal.    [provider]  COMBIVENT RESPIMAT 20-100 MCG/ACT AERS respimat Inhale 1 puff into the lungs every 6 (six) hours as needed. 09/06/21   [provider]  furosemide  (LASIX ) 40 MG tablet Take 40 mg by mouth daily. 09/01/21   [provider]  glipiZIDE (GLUCOTROL XL) 2.5  MG 24 hr tablet Take 2.5 mg by mouth daily. 03/13/17   [provider]  JARDIANCE 25 MG TABS tablet Take 25 mg by mouth every morning. 09/13/21   [provider]  nitroGLYCERIN  (NITROSTAT ) 0.4 MG SL tablet Place 0.4 mg under the tongue every 5 (five) minutes as needed for chest pain. Max 3    [provider]  ondansetron  (ZOFRAN -ODT) 4 MG disintegrating tablet Take by mouth. 11/28/21   [provider]  simvastatin  (ZOCOR ) 20 MG tablet Take 20 mg by mouth daily. 09/14/21   [provider]  SYNTHROID  75 MCG tablet Take 75 mcg by mouth daily before breakfast. 10/25/21   [provider]  vitamin B-12 (CYANOCOBALAMIN) 1000 MCG tablet Take 1,000 mcg by mouth daily.  [provider]  Vitamin D, Ergocalciferol, (DRISDOL) 1.25 MG (50000 UNIT) CAPS capsule Take 50,000 Units by mouth once a week. 10/08/21   [provider]  Zinc 50 MG CAPS Take 1 each by mouth once.    [provider]    Family History Family History  Problem Relation Age of Onset   Lung cancer Mother    Heart attack Mother    Heart Problems Father    Hypertension Father    Other Father        respiratory problems   Ovarian cancer Maternal Grandmother    Cancer Maternal Grandfather    Stroke Paternal Grandmother    Diabetes Brother    Liver cancer Brother    COPD Sister    Other Sister        sepsis   Heart attack Son    Heart attack Son    Rheum arthritis Daughter    Rheum arthritis Daughter    Thyroid cancer Daughter    Hepatitis C Daughter     Social History Social History   Tobacco Use   Smoking status: Never   Smokeless tobacco: Never  Vaping Use   Vaping status: Never Used  Substance Use Topics   Alcohol use: No   Drug use: No     Allergies   Codeine   Review of Systems Review of Systems: negative unless otherwise stated in HPI.      Physical Exam Triage Vital Signs ED Triage Vitals  Encounter Vitals Group     BP       Girls Systolic BP Percentile      Girls Diastolic BP Percentile      Boys Systolic BP Percentile      Boys Diastolic BP Percentile      Pulse      Resp      Temp      Temp src      SpO2      Weight      Height      Head Circumference      Peak Flow      Pain Score      Pain Loc      Pain Education      Exclude from Growth Chart    No data found.  Updated Vital Signs BP 106/66 (BP Location: Right Arm)   Pulse 61   Temp 97.8 F (36.6 C) (Oral)   Resp 15   Ht 5' 2 (1.575 m)   Wt 111.1 kg   SpO2 95%   BMI 44.80 kg/m   Visual Acuity Right Eye Distance:   Left Eye Distance:   Bilateral Distance:    Right Eye Near:   Left Eye Near:    Bilateral Near:     Physical Exam GEN:     alert, non-toxic appearing female in no distress    HENT:  mucus membranes moist, oropharyngeal without lesions or erythema, no tonsillar hypertrophy or exudates,  clear nasal discharge EYES:   no scleral injection or discharge NECK:  no lymphadenopathy, no meningismus   RESP:  no increased work of breathing, clear to auscultation bilaterally CVS:   regular rate and rhythm Skin:   warm and dry     UC Treatments / Results  Labs (all labs ordered are listed, but only abnormal results are displayed) Labs Reviewed - No data to display  EKG   Radiology No results found.   Procedures Procedures (including critical care time)  Medications  Ordered in UC Medications - No data to display  Initial Impression / Assessment and Plan / UC Course  I have reviewed the triage vital signs and the nursing notes.  Pertinent labs & imaging results that were available during my care of the patient were reviewed by me and considered in my medical decision making (see chart for details).        Patient is a 83 y.o. female with history of asthma, HLD, T2DM, CKD 3A,  HTN, OSA, who presents after testing positive for at home without known COVID exposure. Overall she is ill but non-toxic appearing,  well-hydrated and in no respiratory distress. Home COVID and  influenza testing was positive for COVID. Patient brought her test to urgent care.    Discussed symptomatic treatment.  her cough bothers her the most. Treat with Perles.  Pulmonary exam she has equal aeration bilaterally, imaging deferred. Gisel is interested in treatment for COVID. After shared decision making, she is interested in Paxlovid .  Renally dose rx sent to pharmacy.  Isolation and quarantine instructions provided. Typical duration of symptoms discussed. ED and return precautions and understanding voiced. Discussed MDM, treatment plan and plan for follow-up with patient who agrees with plan.      Final Clinical Impressions(s) / UC Diagnoses   Final diagnoses:  Positive self-administered antigen test for COVID-19  URI with cough and congestion     Discharge Instructions      Hold your simvastatin  until 05/15/24 while taking your COVID treatment.    Your home test for COVID-19 was positive, meaning that you were infected with the novel coronavirus and could give the germ to others.  The recommendations suggest returning to normal activities when, for at least 24 hours, symptoms are improving overall, and if a fever was present, it has been gone without use of a fever-reducing medication.  You should wear a mask for the next 5 days to prevent the spread of disease. Please continue good preventive care measures, including:  frequent hand-washing, avoid touching your face, cover coughs/sneezes, stay out of crowds and keep a 6 foot distance from others.  Go to the nearest hospital emergency room if fever/cough/breathlessness are severe or illness seems like a threat to life.  If your were prescribed medication. Stop by the pharmacy to pick it up. You can take Tylenol  and/or Ibuprofen as needed for fever reduction and pain relief.    For cough: honey 1/2 to 1 teaspoon (you can dilute the honey in water or another fluid).  You  can also use guaifenesin  and dextromethorphan for cough. You can use a humidifier for chest congestion and cough.  If you don't have a humidifier, you can sit in the bathroom with the hot shower running.      For sore throat: try warm salt water gargles, Mucinex  sore throat cough drops or cepacol lozenges, throat spray, warm tea or water with lemon/honey, popsicles or ice, or OTC cold relief medicine for throat discomfort. You can also purchase chloraseptic spray at the pharmacy or dollar store.   For congestion: take a daily anti-histamine like Zyrtec, Claritin, and a oral decongestant, such as pseudoephedrine.  You can also use Flonase  1-2 sprays in each nostril daily. Afrin is also a good option, if you do not have high blood pressure.    It is important to stay hydrated: drink plenty of fluids (water, gatorade/powerade/pedialyte, juices, or teas) to keep your throat moisturized and help further relieve irritation/discomfort.    Return or go  to the Emergency Department if symptoms worsen or do not improve in the next few days      ED Prescriptions     Medication Sig Dispense Auth. Provider   nirmatrelvir/ritonavir, renal dosing, (PAXLOVID , 150/100,) 10 x 150 MG & 10 x 100MG  TBPK GFR 50.  Dosage for moderate renal impairment (eGFR >/= 30 to <60 mL/min): 150 mg nirmatrelvir (one 150 mg tablet) with 100 mg ritonavir (one 100 mg tablet), with both tablets taken together twice daily for 5 days. 20 tablet Zackery Brine, DO   benzonatate  (TESSALON ) 100 MG capsule Take 1 capsule (100 mg total) by mouth every 8 (eight) hours. 21 capsule Hartley Wyke, DO      PDMP not reviewed this encounter.   Erva Koke, DO 05/04/24 1340
